# Patient Record
Sex: Female | Born: 1950 | Race: White | Hispanic: No | Marital: Single | Smoking: Current every day smoker
Health system: Southern US, Community
[De-identification: ages and names within clinical notes are randomized; demographics above are authoritative.]

## PROBLEM LIST (undated history)

## (undated) DIAGNOSIS — R42 Dizziness and giddiness: Secondary | ICD-10-CM

## (undated) DIAGNOSIS — I10 Essential (primary) hypertension: Secondary | ICD-10-CM

## (undated) DIAGNOSIS — I499 Cardiac arrhythmia, unspecified: Secondary | ICD-10-CM

## (undated) DIAGNOSIS — J45909 Unspecified asthma, uncomplicated: Secondary | ICD-10-CM

## (undated) DIAGNOSIS — K219 Gastro-esophageal reflux disease without esophagitis: Secondary | ICD-10-CM

## (undated) DIAGNOSIS — J449 Chronic obstructive pulmonary disease, unspecified: Secondary | ICD-10-CM

## (undated) DIAGNOSIS — Z87442 Personal history of urinary calculi: Secondary | ICD-10-CM

## (undated) DIAGNOSIS — M1990 Unspecified osteoarthritis, unspecified site: Secondary | ICD-10-CM

## (undated) HISTORY — PX: ABDOMINAL HYSTERECTOMY: SHX81

## (undated) HISTORY — PX: MANDIBLE SURGERY: SHX707

## (undated) HISTORY — PX: BREAST BIOPSY: SHX20

## (undated) HISTORY — PX: THROAT SURGERY: SHX803

## (undated) HISTORY — PX: LYMPH NODE DISSECTION: SHX5087

## (undated) HISTORY — PX: TUBAL LIGATION: SHX77

## (undated) HISTORY — PX: WISDOM TOOTH EXTRACTION: SHX21

## (undated) MED FILL — Fosaprepitant Dimeglumine For IV Infusion 150 MG (Base Eq): INTRAVENOUS | Qty: 5 | Status: AC

---

## 2007-02-13 ENCOUNTER — Emergency Department: Payer: Self-pay | Admitting: Internal Medicine

## 2009-04-06 DIAGNOSIS — G459 Transient cerebral ischemic attack, unspecified: Secondary | ICD-10-CM

## 2009-04-06 HISTORY — DX: Transient cerebral ischemic attack, unspecified: G45.9

## 2009-08-30 DIAGNOSIS — J019 Acute sinusitis, unspecified: Secondary | ICD-10-CM | POA: Insufficient documentation

## 2010-05-22 DIAGNOSIS — B86 Scabies: Secondary | ICD-10-CM | POA: Insufficient documentation

## 2011-03-26 DIAGNOSIS — Z8249 Family history of ischemic heart disease and other diseases of the circulatory system: Secondary | ICD-10-CM | POA: Insufficient documentation

## 2012-04-29 DIAGNOSIS — I10 Essential (primary) hypertension: Secondary | ICD-10-CM | POA: Insufficient documentation

## 2014-07-05 DIAGNOSIS — F17200 Nicotine dependence, unspecified, uncomplicated: Secondary | ICD-10-CM | POA: Insufficient documentation

## 2016-01-06 ENCOUNTER — Encounter (INDEPENDENT_AMBULATORY_CARE_PROVIDER_SITE_OTHER): Payer: Self-pay

## 2016-01-06 ENCOUNTER — Ambulatory Visit
Admission: RE | Admit: 2016-01-06 | Discharge: 2016-01-06 | Disposition: A | Discharge status: Home / Self Care | Payer: Self-pay | Source: Ambulatory Visit | Attending: Oncology | Admitting: Oncology

## 2016-01-06 ENCOUNTER — Ambulatory Visit: Payer: Self-pay | Attending: Oncology

## 2016-01-06 VITALS — BP 146/88 | HR 72 | Temp 97.6°F | Ht 60.24 in | Wt 109.3 lb

## 2016-01-06 DIAGNOSIS — Z0000 Encounter for general adult medical examination without abnormal findings: Secondary | ICD-10-CM

## 2016-01-06 NOTE — Progress Notes (Signed)
Subjective:     Patient ID: Samuel Jester, female   DOB: Jun 08, 1950, 65 y.o.   MRN: [id number]  HPI   Review of Systems     Objective:   Physical Exam  Pulmonary/Chest: Right breast exhibits no inverted nipple, no mass, no nipple discharge, no skin change and no tenderness. Left breast exhibits no inverted nipple, no mass, no nipple discharge, no skin change and no tenderness. Breasts are symmetrical.    Shoulder surgery -right       Assessment:     65 year old patient presents for [facility] clinic visit.  Patient screened, and meets BCCCP eligibility.  Patient does not have insurance, Medicare or Medicaid.  Handout given on Affordable Care Act.  Instructed patient on breast self-exam using teach back method.  CBE unremarkable.  States she had previous mammogram in [place] 6 years ago, with benign results.    Plan:     Sent for bilateral screening mammogram.

## 2016-01-21 ENCOUNTER — Other Ambulatory Visit: Payer: Self-pay | Admitting: *Deleted

## 2016-01-21 ENCOUNTER — Inpatient Hospital Stay
Admission: RE | Admit: 2016-01-21 | Discharge: 2016-01-21 | Disposition: A | Discharge status: Home / Self Care | Payer: Self-pay | Source: Ambulatory Visit | Attending: *Deleted | Admitting: *Deleted

## 2016-01-21 DIAGNOSIS — Z9289 Personal history of other medical treatment: Secondary | ICD-10-CM

## 2016-01-21 NOTE — Progress Notes (Signed)
Letter mailed from [facility] to notify of normal mammogram results.  Patient to return in one year for annual screening.  Copy to [facility]. 

## 2016-09-14 ENCOUNTER — Ambulatory Visit: Payer: Self-pay | Admitting: Pharmacy Technician

## 2016-09-14 NOTE — Progress Notes (Signed)
Patient scheduled for eligibility appointment at [facility].  Patient did not show for the appointment on September 14, 2016 at 9:00a.m.  Patient called to cancel and did not reschedule eligibility appointment.  [facility] unable to provide additional medication assistance until eligibility is determined.  [facility]

## 2016-12-02 ENCOUNTER — Other Ambulatory Visit: Payer: Self-pay | Admitting: Family Medicine

## 2016-12-02 DIAGNOSIS — E2839 Other primary ovarian failure: Secondary | ICD-10-CM

## 2016-12-24 ENCOUNTER — Other Ambulatory Visit: Payer: Self-pay | Admitting: Family Medicine

## 2016-12-24 DIAGNOSIS — Z1231 Encounter for screening mammogram for malignant neoplasm of breast: Secondary | ICD-10-CM

## 2017-02-02 ENCOUNTER — Ambulatory Visit
Admission: RE | Admit: 2017-02-02 | Discharge: 2017-02-02 | Disposition: A | Discharge status: Home / Self Care | Payer: Medicare HMO | Source: Ambulatory Visit | Attending: Family Medicine | Admitting: Family Medicine

## 2017-02-02 DIAGNOSIS — Z780 Asymptomatic menopausal state: Secondary | ICD-10-CM | POA: Diagnosis not present

## 2017-02-02 DIAGNOSIS — Z1231 Encounter for screening mammogram for malignant neoplasm of breast: Secondary | ICD-10-CM | POA: Insufficient documentation

## 2017-02-02 DIAGNOSIS — E2839 Other primary ovarian failure: Secondary | ICD-10-CM | POA: Diagnosis not present

## 2017-02-02 DIAGNOSIS — Z13820 Encounter for screening for osteoporosis: Secondary | ICD-10-CM | POA: Insufficient documentation

## 2017-02-02 DIAGNOSIS — M4186 Other forms of scoliosis, lumbar region: Secondary | ICD-10-CM | POA: Insufficient documentation

## 2017-02-02 DIAGNOSIS — M47816 Spondylosis without myelopathy or radiculopathy, lumbar region: Secondary | ICD-10-CM | POA: Insufficient documentation

## 2017-08-24 ENCOUNTER — Telehealth: Payer: Self-pay | Admitting: *Deleted

## 2017-08-24 NOTE — Telephone Encounter (Signed)
Received referral for low dose lung cancer screening CT scan. Message left at phone number listed in EMR for patient to call me back to facilitate scheduling scan.  

## 2017-09-01 ENCOUNTER — Telehealth: Payer: Self-pay | Admitting: *Deleted

## 2017-09-01 DIAGNOSIS — Z122 Encounter for screening for malignant neoplasm of respiratory organs: Secondary | ICD-10-CM

## 2017-09-01 DIAGNOSIS — Z87891 Personal history of nicotine dependence: Secondary | ICD-10-CM

## 2017-09-01 NOTE — Telephone Encounter (Signed)
Received referral for initial lung cancer screening scan. Contacted patient and obtained smoking history,(current, 102 pack year) as well as answering questions related to screening process. Patient denies signs of lung cancer such as weight loss or hemoptysis. Patient denies comorbidity that would prevent curative treatment if lung cancer were found. Patient is scheduled for shared decision making visit and CT scan on 09/23/17 .

## 2017-09-22 ENCOUNTER — Encounter: Payer: Self-pay | Admitting: Oncology

## 2017-09-23 ENCOUNTER — Inpatient Hospital Stay: Payer: Medicare HMO | Attending: Oncology | Admitting: Oncology

## 2017-09-23 ENCOUNTER — Encounter (INDEPENDENT_AMBULATORY_CARE_PROVIDER_SITE_OTHER): Payer: Self-pay

## 2017-09-23 ENCOUNTER — Ambulatory Visit
Admission: RE | Admit: 2017-09-23 | Discharge: 2017-09-23 | Disposition: A | Discharge status: Home / Self Care | Payer: Medicare HMO | Source: Ambulatory Visit | Attending: Nurse Practitioner | Admitting: Nurse Practitioner

## 2017-09-23 DIAGNOSIS — I700 Atherosclerosis of aorta: Secondary | ICD-10-CM | POA: Diagnosis not present

## 2017-09-23 DIAGNOSIS — Z87891 Personal history of nicotine dependence: Secondary | ICD-10-CM | POA: Insufficient documentation

## 2017-09-23 DIAGNOSIS — Z122 Encounter for screening for malignant neoplasm of respiratory organs: Secondary | ICD-10-CM | POA: Diagnosis not present

## 2017-09-23 DIAGNOSIS — J439 Emphysema, unspecified: Secondary | ICD-10-CM | POA: Diagnosis not present

## 2017-09-23 NOTE — Progress Notes (Signed)
In accordance with CMS guidelines, patient has met eligibility criteria including age, absence of signs or symptoms of lung cancer.  Social History   Tobacco Use  . Smoking status: Current Every Day Smoker    Packs/day: 2.00    Years: 51.00    Pack years: 102.00    Types: Cigarettes  Substance Use Topics  . Alcohol use: Not on file  . Drug use: Not on file     A shared decision-making session was conducted prior to the performance of CT scan. This includes one or more decision aids, includes benefits and harms of screening, follow-up diagnostic testing, over-diagnosis, false positive rate, and total radiation exposure.  Counseling on the importance of adherence to annual lung cancer LDCT screening, impact of co-morbidities, and ability or willingness to undergo diagnosis and treatment is imperative for compliance of the program.  Counseling on the importance of continued smoking cessation for former smokers; the importance of smoking cessation for current smokers, and information about tobacco cessation interventions have been given to patient including [place] and [address] [facility] [place] programs.  Written order for lung cancer screening with LDCT has been given to the patient and any and all questions have been answered to the best of my abilities.   Yearly follow up will be coordinated by Burgess Estelle, Thoracic Navigator.  Faythe Casa, NP 09/23/2017 2:45 PM

## 2017-09-27 ENCOUNTER — Encounter: Payer: Self-pay | Admitting: *Deleted

## 2018-05-24 ENCOUNTER — Other Ambulatory Visit: Payer: Self-pay | Admitting: Family Medicine

## 2018-05-24 DIAGNOSIS — Z1231 Encounter for screening mammogram for malignant neoplasm of breast: Secondary | ICD-10-CM

## 2018-06-14 ENCOUNTER — Ambulatory Visit
Admission: RE | Admit: 2018-06-14 | Discharge: 2018-06-14 | Disposition: A | Discharge status: Home / Self Care | Payer: Medicare HMO | Source: Ambulatory Visit | Attending: Family Medicine | Admitting: Family Medicine

## 2018-06-14 DIAGNOSIS — Z1231 Encounter for screening mammogram for malignant neoplasm of breast: Secondary | ICD-10-CM

## 2018-10-05 ENCOUNTER — Telehealth: Payer: Self-pay | Admitting: *Deleted

## 2018-10-05 NOTE — Telephone Encounter (Signed)
Attempted to contact regarding lung screening scan, however there is no answer or voicemail option.

## 2018-10-11 ENCOUNTER — Telehealth: Payer: Self-pay | Admitting: *Deleted

## 2018-10-11 NOTE — Telephone Encounter (Signed)
Attempted to contact to schedule annual lung screening scan. However there is no answer or voicemail option.

## 2018-10-17 ENCOUNTER — Encounter: Payer: Self-pay | Admitting: *Deleted

## 2019-02-10 ENCOUNTER — Telehealth: Payer: Self-pay | Admitting: *Deleted

## 2019-02-10 DIAGNOSIS — Z122 Encounter for screening for malignant neoplasm of respiratory organs: Secondary | ICD-10-CM

## 2019-02-10 DIAGNOSIS — Z87891 Personal history of nicotine dependence: Secondary | ICD-10-CM

## 2019-02-10 NOTE — Telephone Encounter (Signed)
Patient has been notified that annual lung cancer screening low dose CT scan is due currently or will be in near future. Confirmed that patient is within the age range of 55-77, and asymptomatic, (no signs or symptoms of lung cancer). Patient denies illness that would prevent curative treatment for lung cancer if found. Verified smoking history, (current, 103 pack year). The shared decision making visit was done 09/23/17. Patient is agreeable for CT scan being scheduled.

## 2019-02-17 ENCOUNTER — Ambulatory Visit
Admission: RE | Admit: 2019-02-17 | Discharge: 2019-02-17 | Disposition: A | Discharge status: Home / Self Care | Payer: Medicare HMO | Source: Ambulatory Visit | Attending: Oncology | Admitting: Oncology

## 2019-02-17 ENCOUNTER — Other Ambulatory Visit: Payer: Self-pay

## 2019-02-17 DIAGNOSIS — Z87891 Personal history of nicotine dependence: Secondary | ICD-10-CM | POA: Insufficient documentation

## 2019-02-17 DIAGNOSIS — Z122 Encounter for screening for malignant neoplasm of respiratory organs: Secondary | ICD-10-CM | POA: Diagnosis not present

## 2019-02-21 ENCOUNTER — Encounter: Payer: Self-pay | Admitting: *Deleted

## 2020-02-14 ENCOUNTER — Telehealth: Payer: Self-pay | Admitting: *Deleted

## 2020-02-14 NOTE — Telephone Encounter (Signed)
Contacted in attempt to schedule annual lung screening scan. Patient reports she wants to have physical next week and then consider scheduling lung screening scan.

## 2020-02-22 ENCOUNTER — Telehealth: Payer: Self-pay | Admitting: *Deleted

## 2020-02-22 NOTE — Telephone Encounter (Signed)
Attempted to contact and schedule lung screening scan. Message left for patient to call back to schedule. 

## 2020-02-26 ENCOUNTER — Other Ambulatory Visit: Payer: Self-pay | Admitting: Physician Assistant

## 2020-02-26 DIAGNOSIS — Z1231 Encounter for screening mammogram for malignant neoplasm of breast: Secondary | ICD-10-CM

## 2020-03-05 ENCOUNTER — Other Ambulatory Visit: Payer: Self-pay | Admitting: *Deleted

## 2020-03-05 DIAGNOSIS — Z122 Encounter for screening for malignant neoplasm of respiratory organs: Secondary | ICD-10-CM

## 2020-03-05 DIAGNOSIS — Z87891 Personal history of nicotine dependence: Secondary | ICD-10-CM

## 2020-03-05 NOTE — Progress Notes (Signed)
Contacted and scheduled for lung screening scan. Patient is a current smoker with a 103 pack year history.

## 2020-03-12 ENCOUNTER — Other Ambulatory Visit: Payer: Self-pay

## 2020-03-12 ENCOUNTER — Ambulatory Visit
Admission: RE | Admit: 2020-03-12 | Discharge: 2020-03-12 | Disposition: A | Discharge status: Home / Self Care | Payer: Medicare HMO | Source: Ambulatory Visit | Attending: Oncology | Admitting: Oncology

## 2020-03-12 DIAGNOSIS — Z122 Encounter for screening for malignant neoplasm of respiratory organs: Secondary | ICD-10-CM | POA: Diagnosis present

## 2020-03-12 DIAGNOSIS — Z87891 Personal history of nicotine dependence: Secondary | ICD-10-CM | POA: Insufficient documentation

## 2020-03-18 ENCOUNTER — Encounter: Payer: Self-pay | Admitting: *Deleted

## 2020-07-24 ENCOUNTER — Encounter: Payer: Self-pay | Admitting: *Deleted

## 2020-09-24 ENCOUNTER — Other Ambulatory Visit: Payer: Self-pay | Admitting: Family Medicine

## 2020-09-24 DIAGNOSIS — Z1231 Encounter for screening mammogram for malignant neoplasm of breast: Secondary | ICD-10-CM

## 2021-06-10 ENCOUNTER — Ambulatory Visit
Admission: RE | Admit: 2021-06-10 | Discharge: 2021-06-10 | Disposition: A | Discharge status: Home / Self Care | Payer: Medicare HMO | Source: Ambulatory Visit | Attending: Nurse Practitioner | Admitting: Nurse Practitioner

## 2021-06-10 ENCOUNTER — Other Ambulatory Visit: Payer: Self-pay | Admitting: Nurse Practitioner

## 2021-06-10 ENCOUNTER — Other Ambulatory Visit: Payer: Self-pay

## 2021-06-10 DIAGNOSIS — G459 Transient cerebral ischemic attack, unspecified: Secondary | ICD-10-CM

## 2021-06-10 DIAGNOSIS — Z9181 History of falling: Secondary | ICD-10-CM | POA: Diagnosis present

## 2021-06-16 ENCOUNTER — Telehealth: Payer: Self-pay | Admitting: Acute Care

## 2021-06-16 NOTE — Telephone Encounter (Signed)
Spoke with patient by phone to schedule LDCT.  She states she has 'a lot of other tests and things' right now and would like to call us back.  She was give the [facility] phone number to contact us when she is able to schedule. ?

## 2021-07-09 ENCOUNTER — Encounter: Payer: Self-pay | Admitting: Otolaryngology

## 2021-07-16 ENCOUNTER — Other Ambulatory Visit
Admission: RE | Admit: 2021-07-16 | Discharge: 2021-07-16 | Disposition: A | Discharge status: Home / Self Care | Payer: Medicare HMO | Source: Ambulatory Visit | Attending: Otolaryngology | Admitting: Otolaryngology

## 2021-07-16 HISTORY — DX: Personal history of urinary calculi: Z87.442

## 2021-07-16 NOTE — Patient Instructions (Addendum)
?Your procedure is scheduled on: Wednesday July 23, 2021 ?Report to Day Surgery inside [facility] 2nd floor, stop by admissions desk before getting on elevator.  ?To find out your arrival time please call [phone] between 1PM - 3PM on Tuesday April, 18, 2023. ? ?Remember: Instructions that are not followed completely may result in serious medical risk,  ?up to and including death, or upon the discretion of your surgeon and anesthesiologist your  ?surgery may need to be rescheduled.  ? ?  _X__ 1. Do not eat food after midnight the night before your procedure. ?                No chewing gum or hard candies. You may drink clear liquids up to 2 hours ?                before you are scheduled to arrive for your surgery- DO not drink clear ?                liquids within 2 hours of the start of your surgery. ?                Clear Liquids include:  water, apple juice without pulp, clear Gatorade, G2 or  ?                Gatorade Zero (avoid Red/Purple/Blue), Black Coffee or Tea (Do not add ?                anything to coffee or tea). ? ?__X__2.  On the morning of surgery brush your teeth with toothpaste and water, you ?               may rinse your mouth with mouthwash if you wish.  Do not swallow any toothpaste or mouthwash. ?   ? _X__ 3.  No Alcohol for 24 hours before or after surgery. ? ? _X__ 4.  Do Not Smoke or use e-cigarettes For 24 Hours Prior to Your Surgery. ?                Do not use any chewable tobacco products for at least 6 hours prior to ?                Surgery. ? ?_X__  5.  Do not use any recreational drugs (marijuana, cocaine, heroin, ecstasy, MDMA or other) ?               For at least one week prior to your surgery.  Combination of these drugs with anesthesia ?               May have life threatening results. ? ?____  6.  Bring all medications with you on the day of surgery if instructed.  ? ?__X__  7.  Notify your doctor if there is any change in your medical  condition  ?    (cold, fever, infections). ?    ?Do not wear jewelry, make-up, hairpins, clips or nail polish. ?Do not wear lotions, powders, or perfumes. You may wear deodorant. ?Do not shave 48 hours prior to surgery.  ?Do not bring valuables to the hospital.   ? ?[facility] is not responsible for any belongings or valuables. ? ?Contacts, dentures or bridgework may not be worn into surgery. ?Leave your suitcase in the car. After surgery it may be brought to your room. ?For patients admitted to the hospital, discharge time is determined by your ?treatment team. ?  ?  Patients discharged the day of surgery will not be allowed to drive home.   ?Make arrangements for someone to be with you for the first 24 hours of your ?Same Day Discharge. ? ? ?__X__ Take these medicines the morning of surgery with A SIP OF WATER:  ? ? 1. amLODipine (NORVASC) 10 MG ? 2. famotidine (PEPCID) 10 MG  ? 3. metoprolol succinate (TOPROL-XL) 100 MG ? 4. ? 5. ? 6. ? ?____ Fleet Enema (as directed)  ? ?____ Use CHG Soap (or wipes) as directed ? ?____ Use Benzoyl Peroxide Gel as instructed ? ?__X__ Use inhalers on the day of surgery ? albuterol (VENTOLIN HFA) 108 (90 Base) MCG/ACT inhaler ? umeclidinium bromide (INCRUSE ELLIPTA) 62.5 MCG/ACT AEPB ? ?____ Stop metformin 2 days prior to surgery   ? ?____ Take 1/2 of usual insulin dose the night before surgery. No insulin the morning ?         of surgery.  ? ?__X__ Stop aspirin 81 mg 3 days  before your surgery. (Take last dose 07/19/21) ? ?__X__ One Week prior to surgery- Stop Anti-inflammatories such as Ibuprofen, Aleve, Advil, Motrin, meloxicam (MOBIC), diclofenac, etodolac, ketorolac, Toradol, Daypro, piroxicam, Goody's or BC powders. OK TO USE TYLENOL IF NEEDED ?  ?__X__ Stop supplements until after surgery.   ? ?____ Bring C-Pap to the hospital.  ? ? ?If you have any questions regarding your pre-procedure instructions,  ?Please call Pre-admit Testing at [phone] ?

## 2021-07-18 ENCOUNTER — Encounter
Admission: RE | Admit: 2021-07-18 | Discharge: 2021-07-18 | Disposition: A | Discharge status: Home / Self Care | Payer: Medicare HMO | Source: Ambulatory Visit | Attending: Otolaryngology | Admitting: Otolaryngology

## 2021-07-18 DIAGNOSIS — Z01810 Encounter for preprocedural cardiovascular examination: Secondary | ICD-10-CM | POA: Diagnosis not present

## 2021-07-18 DIAGNOSIS — Z01812 Encounter for preprocedural laboratory examination: Secondary | ICD-10-CM

## 2021-07-18 DIAGNOSIS — Z01818 Encounter for other preprocedural examination: Secondary | ICD-10-CM | POA: Diagnosis not present

## 2021-07-18 LAB — BASIC METABOLIC PANEL
Anion gap: 10 (ref 5–15)
BUN: 7 mg/dL — ABNORMAL LOW (ref 8–23)
CO2: 25 mmol/L (ref 22–32)
Calcium: 9.4 mg/dL (ref 8.9–10.3)
Chloride: 91 mmol/L — ABNORMAL LOW (ref 98–111)
Creatinine, Ser: 0.41 mg/dL — ABNORMAL LOW (ref 0.44–1.00)
GFR, Estimated: >60 mL/min (ref >60)
Glucose, Bld: 135 mg/dL — ABNORMAL HIGH (ref 70–99)
Potassium: 3.6 mmol/L (ref 3.5–5.1)
Sodium: 126 mmol/L — ABNORMAL LOW (ref 135–145)

## 2021-07-18 LAB — CBC
HCT: 38.1 % (ref 36.0–46.0)
Hemoglobin: 12.3 g/dL (ref 12.0–15.0)
MCH: 29.2 pg (ref 26.0–34.0)
MCHC: 32.3 g/dL (ref 30.0–36.0)
MCV: 90.5 fL (ref 80.0–100.0)
Platelets: 435 10*3/uL — ABNORMAL HIGH (ref 150–400)
RBC: 4.21 MIL/uL (ref 3.87–5.11)
RDW: 12.2 % (ref 11.5–15.5)
WBC: 9.7 10*3/uL (ref 4.0–10.5)
nRBC: 0 % (ref 0.0–0.2)

## 2021-07-18 NOTE — Pre-Procedure Instructions (Signed)
Faxed over Na result of 126 to pcp (Dr Alene Mires) per anesthesia. We well recheck Na dos but anesthesia wanted this sent to pcp for further w/u after surgery ?

## 2021-07-23 ENCOUNTER — Other Ambulatory Visit: Payer: Self-pay

## 2021-07-23 ENCOUNTER — Encounter: Payer: Self-pay | Admitting: Anesthesiology

## 2021-07-23 ENCOUNTER — Ambulatory Visit: Payer: Self-pay | Admitting: Anesthesiology

## 2021-07-23 ENCOUNTER — Ambulatory Visit
Admission: RE | Admit: 2021-07-23 | Discharge: 2021-07-23 | Disposition: A | Discharge status: Home / Self Care | Payer: Medicare HMO | Attending: Otolaryngology | Admitting: Otolaryngology

## 2021-07-23 ENCOUNTER — Encounter
Admission: RE | Disposition: A | Discharge status: Home / Self Care | Payer: Self-pay | Source: Home / Self Care | Attending: Otolaryngology

## 2021-07-23 DIAGNOSIS — C119 Malignant neoplasm of nasopharynx, unspecified: Secondary | ICD-10-CM | POA: Insufficient documentation

## 2021-07-23 DIAGNOSIS — Z01812 Encounter for preprocedural laboratory examination: Secondary | ICD-10-CM

## 2021-07-23 DIAGNOSIS — F17210 Nicotine dependence, cigarettes, uncomplicated: Secondary | ICD-10-CM | POA: Diagnosis not present

## 2021-07-23 HISTORY — DX: Unspecified asthma, uncomplicated: J45.909

## 2021-07-23 HISTORY — DX: Essential (primary) hypertension: I10

## 2021-07-23 HISTORY — DX: Chronic obstructive pulmonary disease, unspecified: J44.9

## 2021-07-23 HISTORY — DX: Dizziness and giddiness: R42

## 2021-07-23 HISTORY — DX: Gastro-esophageal reflux disease without esophagitis: K21.9

## 2021-07-23 HISTORY — PX: NASAL ENDOSCOPY: SHX6577

## 2021-07-23 HISTORY — DX: Unspecified osteoarthritis, unspecified site: M19.90

## 2021-07-23 HISTORY — DX: Cardiac arrhythmia, unspecified: I49.9

## 2021-07-23 LAB — SODIUM: Sodium: 126 mmol/L — ABNORMAL LOW (ref 135–145)

## 2021-07-23 SURGERY — ENDOSCOPY, NOSE
Anesthesia: General | Laterality: Bilateral

## 2021-07-23 MED ORDER — LACTATED RINGERS IV SOLN: INTRAVENOUS | Status: DC | PRN

## 2021-07-23 MED ORDER — LIDOCAINE HCL (CARDIAC) PF 100 MG/5ML IV SOSY
PREFILLED_SYRINGE | INTRAVENOUS | Status: DC | PRN
Start: 2021-07-23 — End: 2021-07-23
  Administered 2021-07-23 (×2): 50 mg via INTRAVENOUS

## 2021-07-23 MED ORDER — PHENYLEPHRINE 80 MCG/ML (10ML) SYRINGE FOR IV PUSH (FOR BLOOD PRESSURE SUPPORT)
PREFILLED_SYRINGE | INTRAVENOUS | Status: AC
  Filled 2021-07-23: qty 10

## 2021-07-23 MED ORDER — OXYCODONE HCL 5 MG/5ML PO SOLN: 5.0000 mg | Freq: Once | ORAL | Status: DC | PRN

## 2021-07-23 MED ORDER — PROPOFOL 10 MG/ML IV BOLUS
INTRAVENOUS | Status: DC | PRN
  Administered 2021-07-23: 100 mg via INTRAVENOUS
  Administered 2021-07-23: 60 mg via INTRAVENOUS

## 2021-07-23 MED ORDER — DEXAMETHASONE SODIUM PHOSPHATE 10 MG/ML IJ SOLN
INTRAMUSCULAR | Status: AC
  Filled 2021-07-23: qty 1

## 2021-07-23 MED ORDER — OXYMETAZOLINE HCL 0.05 % NA SOLN
NASAL | Status: DC | PRN
  Administered 2021-07-23: 1

## 2021-07-23 MED ORDER — ONDANSETRON HCL 4 MG/2ML IJ SOLN: 4.0000 mg | Freq: Once | INTRAMUSCULAR | Status: DC | PRN

## 2021-07-23 MED ORDER — LIDOCAINE-EPINEPHRINE 1 %-1:200000 IJ SOLN
INTRAMUSCULAR | Status: AC
Start: 2021-07-23 — End: ?
  Filled 2021-07-23: qty 30

## 2021-07-23 MED ORDER — OXYCODONE HCL 5 MG PO TABS: 5.0000 mg | ORAL_TABLET | Freq: Once | ORAL | Status: DC | PRN

## 2021-07-23 MED ORDER — 0.9 % SODIUM CHLORIDE (POUR BTL) OPTIME
TOPICAL | Status: DC | PRN
Start: 2021-07-23 — End: 2021-07-23
  Administered 2021-07-23: 150 mL

## 2021-07-23 MED ORDER — PHENYLEPHRINE 80 MCG/ML (10ML) SYRINGE FOR IV PUSH (FOR BLOOD PRESSURE SUPPORT)
PREFILLED_SYRINGE | INTRAVENOUS | Status: DC | PRN
  Administered 2021-07-23 (×2): 80 ug via INTRAVENOUS
  Administered 2021-07-23: 160 ug via INTRAVENOUS

## 2021-07-23 MED ORDER — OXYMETAZOLINE HCL 0.05 % NA SOLN
NASAL | Status: AC
Start: 2021-07-23 — End: ?
  Filled 2021-07-23: qty 30

## 2021-07-23 MED ORDER — CHLORHEXIDINE GLUCONATE 0.12 % MT SOLN: 15.0000 mL | Freq: Once | OROMUCOSAL | Status: DC

## 2021-07-23 MED ORDER — LACTATED RINGERS IV SOLN: INTRAVENOUS | Status: DC

## 2021-07-23 MED ORDER — FENTANYL CITRATE (PF) 100 MCG/2ML IJ SOLN
INTRAMUSCULAR | Status: AC
  Administered 2021-07-23: 50 ug via INTRAVENOUS
  Filled 2021-07-23: qty 2

## 2021-07-23 MED ORDER — ONDANSETRON HCL 4 MG/2ML IJ SOLN
INTRAMUSCULAR | Status: AC
  Filled 2021-07-23: qty 2

## 2021-07-23 MED ORDER — DEXAMETHASONE SODIUM PHOSPHATE 10 MG/ML IJ SOLN
INTRAMUSCULAR | Status: DC | PRN
  Administered 2021-07-23: 10 mg via INTRAVENOUS

## 2021-07-23 MED ORDER — MIDAZOLAM HCL 2 MG/2ML IJ SOLN
INTRAMUSCULAR | Status: AC
  Filled 2021-07-23: qty 2

## 2021-07-23 MED ORDER — FENTANYL CITRATE (PF) 100 MCG/2ML IJ SOLN
INTRAMUSCULAR | Status: AC
Start: 2021-07-23 — End: ?
  Filled 2021-07-23: qty 2

## 2021-07-23 MED ORDER — ONDANSETRON HCL 4 MG/2ML IJ SOLN
INTRAMUSCULAR | Status: DC | PRN
  Administered 2021-07-23: 4 mg via INTRAVENOUS

## 2021-07-23 MED ORDER — PROPOFOL 10 MG/ML IV BOLUS
INTRAVENOUS | Status: AC
  Filled 2021-07-23: qty 20

## 2021-07-23 MED ORDER — FENTANYL CITRATE (PF) 100 MCG/2ML IJ SOLN
25.0000 ug | INTRAMUSCULAR | Status: DC | PRN
  Administered 2021-07-23: 50 ug via INTRAVENOUS

## 2021-07-23 MED ORDER — LIDOCAINE HCL (PF) 2 % IJ SOLN
INTRAMUSCULAR | Status: AC
  Filled 2021-07-23: qty 5

## 2021-07-23 MED ORDER — SUCCINYLCHOLINE CHLORIDE 200 MG/10ML IV SOSY
PREFILLED_SYRINGE | INTRAVENOUS | Status: DC | PRN
  Administered 2021-07-23: 60 mg via INTRAVENOUS

## 2021-07-23 MED ORDER — FENTANYL CITRATE (PF) 100 MCG/2ML IJ SOLN
INTRAMUSCULAR | Status: DC | PRN
  Administered 2021-07-23 (×4): 25 ug via INTRAVENOUS

## 2021-07-23 MED ORDER — PHENYLEPHRINE HCL-NACL 20-0.9 MG/250ML-% IV SOLN
INTRAVENOUS | Status: AC
  Filled 2021-07-23: qty 250

## 2021-07-23 MED ORDER — MIDAZOLAM HCL 2 MG/2ML IJ SOLN
INTRAMUSCULAR | Status: DC | PRN
  Administered 2021-07-23: 1 mg via INTRAVENOUS

## 2021-07-23 MED ORDER — LIDOCAINE-EPINEPHRINE (PF) 1 %-1:200000 IJ SOLN
INTRAMUSCULAR | Status: DC | PRN
  Administered 2021-07-23: 3 mL

## 2021-07-23 MED ORDER — ORAL CARE MOUTH RINSE: 15.0000 mL | Freq: Once | OROMUCOSAL | Status: DC

## 2021-07-23 SURGICAL SUPPLY — 14 items
BLADE SURG 15 STRL LF DISP TIS (BLADE) ×1 IMPLANT
BLADE SURG 15 STRL SS (BLADE) ×1
ELECT REM PT RETURN 9FT ADLT (ELECTROSURGICAL) ×2
ELECTRODE REM PT RTRN 9FT ADLT (ELECTROSURGICAL) ×1 IMPLANT
GAUZE 4X4 16PLY ~~LOC~~+RFID DBL (SPONGE) ×2 IMPLANT
GLOVE SURG ENC MOIS LTX SZ7.5 (GLOVE) ×2 IMPLANT
GOWN STRL REUS W/ TWL LRG LVL3 (GOWN DISPOSABLE) ×2 IMPLANT
GOWN STRL REUS W/TWL LRG LVL3 (GOWN DISPOSABLE) ×2
MANIFOLD NEPTUNE II (INSTRUMENTS) ×2 IMPLANT
NDL SPNL 22GX3.5 QUINCKE BK (NEEDLE) IMPLANT
NEEDLE SPNL 22GX3.5 QUINCKE BK (NEEDLE) ×2 IMPLANT
PACK HEAD/NECK (MISCELLANEOUS) ×2 IMPLANT
SPONGE NEURO XRAY DETECT 1X3 (DISPOSABLE) ×2 IMPLANT
WATER STERILE IRR 500ML POUR (IV SOLUTION) ×2 IMPLANT

## 2021-07-23 NOTE — Anesthesia Postprocedure Evaluation (Signed)
Anesthesia Post Note ? ?Patient: Vickie Caldwell ? ?Procedure(s) Performed: NASAL ENDOSCOPY WITH BIOPSY OF NASOPHARYNGEAL MASS (Bilateral) ? ?Patient location during evaluation: PACU ?Anesthesia Type: General ?Level of consciousness: awake and alert, oriented and patient cooperative ?Pain management: pain level controlled ?Vital Signs Assessment: post-procedure vital signs reviewed and stable ?Respiratory status: spontaneous breathing, nonlabored ventilation and respiratory function stable ?Cardiovascular status: blood pressure returned to baseline and stable ?Postop Assessment: adequate PO intake ?Anesthetic complications: no ? ? ?No notable events documented. ? ? ?Last Vitals:  ?Vitals:  ? 07/23/21 0945 07/23/21 1000  ?BP: (!) 144/76 (!) 141/99  ?Pulse: 69 74  ?Resp: 16 14  ?Temp: 36.7 ?C (!) 36.3 ?C  ?SpO2: 96% 94%  ?  ?Last Pain:  ?Vitals:  ? 07/23/21 1000  ?TempSrc: Temporal  ?PainSc: 0-No pain  ? ? ?  ?  ?  ?  ?  ?  ? ?Darrin Nipper ? ? ? ? ?

## 2021-07-23 NOTE — Anesthesia Preprocedure Evaluation (Addendum)
Anesthesia Evaluation  ?Patient identified by MRN, date of birth, ID band ?Patient awake ? ? ? ?Reviewed: ?Allergy & Precautions, NPO status , Patient's Chart, lab work & pertinent test results ? ?History of Anesthesia Complications ?Negative for: history of anesthetic complications ? ?Airway ?Mallampati: I ? ? ?Neck ROM: Full ? ? ? Dental ? ?(+) Missing, Chipped ?  ?Pulmonary ?asthma , COPD, Current Smoker (1 ppd)Patient did not abstain from smoking.,  ?  ?Pulmonary exam normal ?breath sounds clear to auscultation ? ? ? ? ? ? Cardiovascular ?hypertension, Normal cardiovascular exam ?Rhythm:Regular Rate:Normal ? ?ECG 07/18/21:  ?Normal sinus rhythm ?Incomplete right bundle branch block ?Borderline ECG ?When compared with ECG of 21-Aug-1999 11:34, ?No significant change was found ?  ?Neuro/Psych ?Vertigo  ?TIA (2011)  ? GI/Hepatic ?GERD  ,  ?Endo/Other  ?negative endocrine ROS ? Renal/GU ?Renal disease (nephrolithiasis)  ? ?  ?Musculoskeletal ? ?(+) Arthritis ,  ? Abdominal ?  ?Peds ? Hematology ?negative hematology ROS ?(+)   ?Anesthesia Other Findings ? ? Reproductive/Obstetrics ? ?  ? ? ? ? ? ? ? ? ? ? ? ? ? ?  ?  ? ? ? ? ? ? ? ?Anesthesia Physical ?Anesthesia Plan ? ?ASA: 3 ? ?Anesthesia Plan: General  ? ?Post-op Pain Management:   ? ?Induction: Intravenous ? ?PONV Risk Score and Plan: 2 and Treatment may vary due to age or medical condition, Ondansetron and Dexamethasone ? ?Airway Management Planned: Oral ETT ? ?Additional Equipment:  ? ?Intra-op Plan:  ? ?Post-operative Plan: Extubation in OR ? ?Informed Consent: I have reviewed the patients History and Physical, chart, labs and discussed the procedure including the risks, benefits and alternatives for the proposed anesthesia with the patient or authorized representative who has indicated his/her understanding and acceptance.  ? ? ? ?Dental advisory given ? ?Plan Discussed with: CRNA ? ?Anesthesia Plan Comments: (Patient  consented for risks of anesthesia including but not limited to:  ?- adverse reactions to medications ?- damage to eyes, teeth, lips or other oral mucosa ?- nerve damage due to positioning  ?- sore throat or hoarseness ?- damage to heart, brain, nerves, lungs, other parts of body or loss of life ? ?Informed patient about role of CRNA in peri- and intra-operative care.  Patient voiced understanding.)  ? ? ? ? ? ?Anesthesia Quick Evaluation ? ?

## 2021-07-23 NOTE — Transfer of Care (Addendum)
Immediate Anesthesia Transfer of Care Note ? ?Patient: Vickie Caldwell ? ?Procedure(s) Performed: NASAL ENDOSCOPY WITH BIOPSY OF NASOPHARYNGEAL MASS (Bilateral) ? ?Patient Location: PACU ? ?Anesthesia Type:General ? ?Level of Consciousness: awake ? ?Airway & Oxygen Therapy: Patient Spontanous Breathing and Patient connected to face mask ? ?Post-op Assessment: Report given to RN and Post -op Vital signs reviewed and stable ? ?Post vital signs: Reviewed and stable ? ?Last Vitals:  ?Vitals Value Taken Time  ?BP 129/86 07/23/21 0915  ?Temp 36.4 ?C 07/23/21 0910  ?Pulse 63 07/23/21 0915  ?Resp 12 07/23/21 0915  ?SpO2 100 % 07/23/21 0915  ?Vitals shown include unvalidated device data. ? ?Last Pain:  ?Vitals:  ? 07/23/21 0910  ?TempSrc:   ?PainSc: 0-No pain  ?   ? ?  ? ?Complications: No notable events documented. ?

## 2021-07-23 NOTE — H&P (Signed)
Vickie Caldwell, Vickie Caldwell ?[id number] ?06-07-1950 ? ?Date of Admission: '@TODAY'$ @ ?Admitting Physician: Riley Nearing ? ?Chief Complaint: Nasopharyngeal mass ? ?HPI: This 71 y.o. year old female presented with persistent headaches and some throat pain with findings of a nasopharyngeal mass on scope exam.  CT scan did not revealed any significant sinus disease. ? ?Medications:  ?Medications Prior to Admission  ?Medication Sig Dispense Refill  ? acetaminophen (TYLENOL) 325 MG tablet Take 650 mg by mouth every 6 (six) hours as needed.    ? albuterol (VENTOLIN HFA) 108 (90 Base) MCG/ACT inhaler Inhale into the lungs every 6 (six) hours as needed for wheezing or shortness of breath.    ? amLODipine (NORVASC) 10 MG tablet Take 10 mg by mouth daily.    ? ASPIRIN 81 PO Take by mouth daily.    ? Cholecalciferol (VITAMIN D3 PO) Take by mouth daily.    ? famotidine (PEPCID) 10 MG tablet Take 20 mg by mouth daily.    ? fluticasone (FLONASE) 50 MCG/ACT nasal spray Place into both nostrils daily.    ? metoprolol succinate (TOPROL-XL) 100 MG 24 hr tablet Take 100 mg by mouth daily. Take with or immediately following a meal.    ? montelukast (SINGULAIR) 10 MG tablet Take 10 mg by mouth daily.    ? umeclidinium bromide (INCRUSE ELLIPTA) 62.5 MCG/ACT AEPB Inhale 1 puff into the lungs daily.    ? ? ?Allergies:  ?Allergies  ?Allergen Reactions  ? Codeine Nausea And Vomiting  ? Penicillins Rash  ? ? ?PMH:  ?Past Medical History:  ?Diagnosis Date  ? Asthma   ? COPD (chronic obstructive pulmonary disease) ([place])   ? Dysrhythmia   ? GERD (gastroesophageal reflux disease)   ? History of kidney stones   ? Hypertension   ? Osteoarthritis   ? TIA (transient ischemic attack) 2011  ? No Deficits  ? Vertigo   ? ? ?Fam Hx:  ?Family History  ?Problem Relation Age of Onset  ? Breast cancer Neg Hx   ? ? ?Soc Hx:  ?Social History  ? ?Socioeconomic History  ? Marital status: Single  ?  Spouse name: Not on file  ? Number of children: Not on file  ? Years of  education: Not on file  ? Highest education level: Not on file  ?Occupational History  ? Not on file  ?Tobacco Use  ? Smoking status: Every Day  ?  Packs/day: 1.00  ?  Years: 51.00  ?  Pack years: 51.00  ?  Types: Cigarettes  ? Smokeless tobacco: Never  ? Tobacco comments:  ?  Started smoking around age 35  ?Vaping Use  ? Vaping Use: Never used  ?Substance and Sexual Activity  ? Alcohol use: Yes  ?  Alcohol/week: 1.0 standard drink  ?  Types: 1 Cans of beer per week  ?  Comment: occasional  ? Drug use: Never  ? Sexual activity: Not on file  ?Other Topics Concern  ? Not on file  ?Social History Narrative  ? Not on file  ? ?Social Determinants of Health  ? ?Financial Resource Strain: Not on file  ?Food Insecurity: Not on file  ?Transportation Needs: Not on file  ?Physical Activity: Not on file  ?Stress: Not on file  ?Social Connections: Not on file  ?Intimate Partner Violence: Not on file  ? ? ?PSH:  ?Past Surgical History:  ?Procedure Laterality Date  ? ABDOMINAL HYSTERECTOMY    ? BREAST BIOPSY Left 1980's  ? neg. Pt states  punch biopsy at the nipple  ? LYMPH NODE DISSECTION Right   ? neck  ? MANDIBLE SURGERY    ? Sagittal split  ? THROAT SURGERY    ? Vocal cord polyps "burned"  ? TUBAL LIGATION    ? WISDOM TOOTH EXTRACTION    ?.  ? ?ROS: Negative for fever..  ? ?PHYSICAL EXAM  ?Vitals: Blood pressure 129/67, pulse 72, temperature 98.1 ?F (36.7 ?C), temperature source Temporal, resp. rate 16, height '4\' 8"'$  (1.422 m), weight 48.1 kg, SpO2 96 %.Marland Kitchen ?General: Well-developed, Well-nourished in no acute distress ?Mood: Mood and affect well adjusted, pleasant and cooperative. ?Orientation: Grossly alert and oriented. ?Vocal Quality: No hoarseness. Communicates verbally. ?head and Face: NCAT. No facial asymmetry. No visible skin lesions. No significant facial scars. No tenderness with sinus percussion. Facial strength normal and symmetric. ?Respiratory: Normal respiratory effort without labored breathing. ?Cardiovascular:  Carotid pulse shows regular rate and rhythm ? ?MEDICAL DECISION MAKING: ?Data Review:  ?Results for orders placed or performed during the hospital encounter of 07/23/21 (from the past 48 hour(s))  ?Sodium     Status: Abnormal  ? Collection Time: 07/23/21  7:24 AM  ?Result Value Ref Range  ? Sodium 126 (L) 135 - 145 mmol/L  ?  Comment: Performed at [facility], [address] [place], [place] [address]  ?Marland Kitchen No results found..  ? ?ASSESSMENT: Nasopharyngeal mass ? ?PLAN: Nasal endoscopy with biopsies of nasopharyngeal mass ? ? ?Riley Nearing ?07/23/2021 ?9:11 AM  ? ?

## 2021-07-23 NOTE — H&P (Signed)
History and physical reviewed and will be scanned in later. No change in medical status reported by the patient or family, appears stable for surgery. All questions regarding the procedure answered, and patient (or family if a child) expressed understanding of the procedure. ? ?Vickie Caldwell [name] ?@TODAY@ ?

## 2021-07-23 NOTE — Discharge Instructions (Signed)
AMBULATORY SURGERY  ?DISCHARGE INSTRUCTIONS ? ? ?The drugs that you were given will stay in your system until tomorrow so for the next 24 hours you should not: ? ?Drive an automobile ?Make any legal decisions ?Drink any alcoholic beverage ? ? ?You may resume regular meals tomorrow.  Today it is better to start with liquids and gradually work up to solid foods. ? ?You may eat anything you prefer, but it is better to start with liquids, then soup and crackers, and gradually work up to solid foods. ? ? ?Please notify your doctor immediately if you have any unusual bleeding, trouble breathing, redness and pain at the surgery site, drainage, fever, or pain not relieved by medication. ? ? ? ?Additional Instructions: ? ? ? ?Please contact your physician with any problems or Same Day Surgery at [phone], Monday through Friday 6 am to 4 pm, or [place] [facility] number at [phone].  ?

## 2021-07-23 NOTE — Anesthesia Procedure Notes (Signed)
Procedure Name: Intubation ?Date/Time: 07/23/2021 8:36 AM ?Performed by: Loletha Grayer, CRNA ?Pre-anesthesia Checklist: Patient identified, Patient being monitored, Timeout performed, Emergency Drugs available and Suction available ?Patient Re-evaluated:Patient Re-evaluated prior to induction ?Oxygen Delivery Method: Circle system utilized ?Preoxygenation: Pre-oxygenation with 100% oxygen ?Induction Type: IV induction ?Ventilation: Mask ventilation without difficulty ?Laryngoscope Size: 3 and McGraph ?Grade View: Grade I ?Tube type: Oral ?Tube size: 6.5 mm ?Number of attempts: 1 ?Airway Equipment and Method: Stylet ?Placement Confirmation: ETT inserted through vocal cords under direct vision, positive ETCO2 and breath sounds checked- equal and bilateral ?Secured at: 20 cm ?Tube secured with: Tape ?Dental Injury: Teeth and Oropharynx as per pre-operative assessment  ?Comments: Intubated with McGrath and head/neck maintained in neutral position due to patient complaints of pain with neck extension and need for [facility] elevation ? ? ? ? ?

## 2021-07-23 NOTE — Op Note (Signed)
07/23/2021 ? ?9:03 AM ? ? ? ?Vickie Caldwell ? ?[id number] ? ? ?Pre-Op Diagnosis:  Nasopharyngeal mass ? ?Post-op Diagnosis: Nasopharyngeal mass ? ?Procedure:  Nasal endoscopy with biopsies of nasopharyngeal mass ?   ?Surgeon:  Riley Nearing ? ?Anesthesia:  General endotracheal ? ?EBL:  minimal ? ?Complications:  None ? 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  ? ?Procedure: After the patient was identified in holding and the benefits of the procedure were reviewed as well as the consent and risks, the patient was taken to the operating room and with the patient in a comfortable supine position,  general orotracheal anesthesia was induced without difficulty.  A proper time-out was performed.  ? ?Next  Afrin pledgets were placed in the nasopharynx, allowing several minutes to decongest while the patient was prepped and draped in the standard fashion.. 1% Xylocaine with 1:100,000 epinephrine was infiltrated into the nasopharyngeal region bilaterally.  Several minutes were allowed for this to take effect.  A 0 degree scope was then used to visualize the nasopharynx, taking note of the extent of the tumor mass.  Multiple biopsies were then taken of the mass and sent as a fresh specimen in case flow cytometry was necessary.  Bleeding was minimal and easily controlled with Afrin moistened pledgets. ? ?The nose was suctioned and inspected.  Bleeding was felt to be well controlled. ?The patient was then returned to the anesthesiologist for awakening and taken to recovery room in good condition postoperatively. ? ?Disposition:   PACU and d/c home ? ?Plan: Discharge home, Tylenol as needed for pain.  The patient will be contacted with biopsy results and further recommendations. ? ? ?Riley Nearing ?07/23/2021 ?9:03 AM  ?

## 2021-07-24 ENCOUNTER — Encounter: Payer: Self-pay | Admitting: Otolaryngology

## 2021-07-25 ENCOUNTER — Encounter: Payer: Self-pay | Admitting: Otolaryngology

## 2021-07-30 LAB — SURGICAL PATHOLOGY

## 2021-08-01 ENCOUNTER — Other Ambulatory Visit: Payer: Self-pay | Admitting: Otolaryngology

## 2021-08-01 DIAGNOSIS — C119 Malignant neoplasm of nasopharynx, unspecified: Secondary | ICD-10-CM

## 2021-08-07 ENCOUNTER — Ambulatory Visit
Admission: RE | Admit: 2021-08-07 | Discharge: 2021-08-07 | Disposition: A | Discharge status: Home / Self Care | Payer: Medicare HMO | Source: Ambulatory Visit | Attending: Otolaryngology | Admitting: Otolaryngology

## 2021-08-07 DIAGNOSIS — J439 Emphysema, unspecified: Secondary | ICD-10-CM | POA: Insufficient documentation

## 2021-08-07 DIAGNOSIS — C779 Secondary and unspecified malignant neoplasm of lymph node, unspecified: Secondary | ICD-10-CM | POA: Diagnosis not present

## 2021-08-07 DIAGNOSIS — C119 Malignant neoplasm of nasopharynx, unspecified: Secondary | ICD-10-CM | POA: Diagnosis not present

## 2021-08-07 LAB — GLUCOSE, CAPILLARY: Glucose-Capillary: 113 mg/dL — ABNORMAL HIGH (ref 70–99)

## 2021-08-07 MED ORDER — FLUDEOXYGLUCOSE F - 18 (FDG) INJECTION
5.9600 | Freq: Once | INTRAVENOUS | Status: AC | PRN
  Administered 2021-08-07: 5.96 via INTRAVENOUS

## 2021-08-11 ENCOUNTER — Telehealth: Payer: Self-pay

## 2021-08-11 ENCOUNTER — Inpatient Hospital Stay: Payer: Medicare HMO

## 2021-08-11 ENCOUNTER — Inpatient Hospital Stay: Payer: Medicare HMO | Attending: Internal Medicine | Admitting: Internal Medicine

## 2021-08-11 ENCOUNTER — Encounter: Payer: Self-pay | Admitting: Internal Medicine

## 2021-08-11 VITALS — BP 121/79 | HR 83 | Temp 97.9°F | Ht <= 58 in | Wt 107.4 lb

## 2021-08-11 DIAGNOSIS — C770 Secondary and unspecified malignant neoplasm of lymph nodes of head, face and neck: Secondary | ICD-10-CM | POA: Diagnosis not present

## 2021-08-11 DIAGNOSIS — F17210 Nicotine dependence, cigarettes, uncomplicated: Secondary | ICD-10-CM | POA: Insufficient documentation

## 2021-08-11 DIAGNOSIS — Z5111 Encounter for antineoplastic chemotherapy: Secondary | ICD-10-CM | POA: Diagnosis not present

## 2021-08-11 DIAGNOSIS — Z79899 Other long term (current) drug therapy: Secondary | ICD-10-CM | POA: Insufficient documentation

## 2021-08-11 DIAGNOSIS — J449 Chronic obstructive pulmonary disease, unspecified: Secondary | ICD-10-CM | POA: Insufficient documentation

## 2021-08-11 DIAGNOSIS — C119 Malignant neoplasm of nasopharynx, unspecified: Secondary | ICD-10-CM

## 2021-08-11 DIAGNOSIS — E871 Hypo-osmolality and hyponatremia: Secondary | ICD-10-CM

## 2021-08-11 DIAGNOSIS — I10 Essential (primary) hypertension: Secondary | ICD-10-CM | POA: Diagnosis not present

## 2021-08-11 LAB — COMPREHENSIVE METABOLIC PANEL
ALT: 19 U/L (ref 0–44)
AST: 22 U/L (ref 15–41)
Albumin: 4.1 g/dL (ref 3.5–5.0)
Alkaline Phosphatase: 88 U/L (ref 38–126)
Anion gap: 6 (ref 5–15)
BUN: 6 mg/dL — ABNORMAL LOW (ref 8–23)
CO2: 28 mmol/L (ref 22–32)
Calcium: 9 mg/dL (ref 8.9–10.3)
Chloride: 96 mmol/L — ABNORMAL LOW (ref 98–111)
Creatinine, Ser: 0.56 mg/dL (ref 0.44–1.00)
GFR, Estimated: >60 mL/min (ref >60)
Glucose, Bld: 106 mg/dL — ABNORMAL HIGH (ref 70–99)
Potassium: 3.6 mmol/L (ref 3.5–5.1)
Sodium: 130 mmol/L — ABNORMAL LOW (ref 135–145)
Total Bilirubin: 0.6 mg/dL (ref 0.3–1.2)
Total Protein: 7.7 g/dL (ref 6.5–8.1)

## 2021-08-11 LAB — MAGNESIUM: Magnesium: 2.1 mg/dL (ref 1.7–2.4)

## 2021-08-11 LAB — CBC WITH DIFFERENTIAL/PLATELET
Abs Immature Granulocytes: 0.04 10*3/uL (ref 0.00–0.07)
Basophils Absolute: 0 10*3/uL (ref 0.0–0.1)
Basophils Relative: 0 %
Eosinophils Absolute: 0.2 10*3/uL (ref 0.0–0.5)
Eosinophils Relative: 2 %
HCT: 40.8 % (ref 36.0–46.0)
Hemoglobin: 13.2 g/dL (ref 12.0–15.0)
Immature Granulocytes: 0 %
Lymphocytes Relative: 13 %
Lymphs Abs: 1.3 10*3/uL (ref 0.7–4.0)
MCH: 29.6 pg (ref 26.0–34.0)
MCHC: 32.4 g/dL (ref 30.0–36.0)
MCV: 91.5 fL (ref 80.0–100.0)
Monocytes Absolute: 0.8 10*3/uL (ref 0.1–1.0)
Monocytes Relative: 9 %
Neutro Abs: 7.2 10*3/uL (ref 1.7–7.7)
Neutrophils Relative %: 76 %
Platelets: 411 10*3/uL — ABNORMAL HIGH (ref 150–400)
RBC: 4.46 MIL/uL (ref 3.87–5.11)
RDW: 12.1 % (ref 11.5–15.5)
WBC: 9.5 10*3/uL (ref 4.0–10.5)
nRBC: 0 % (ref 0.0–0.2)

## 2021-08-11 LAB — SODIUM, URINE, RANDOM: Sodium, Ur: 15 mmol/L

## 2021-08-11 LAB — OSMOLALITY, URINE: Osmolality, Ur: 71 mOsm/kg — ABNORMAL LOW (ref 300–900)

## 2021-08-11 LAB — OSMOLALITY: Osmolality: 282 mOsm/kg (ref 275–295)

## 2021-08-11 MED ORDER — PROCHLORPERAZINE MALEATE 10 MG PO TABS: 10.0000 mg | ORAL_TABLET | Freq: Four times a day (QID) | ORAL | 1 refills | Status: DC | PRN

## 2021-08-11 MED ORDER — LIDOCAINE-PRILOCAINE 2.5-2.5 % EX CREA
TOPICAL_CREAM | CUTANEOUS | 3 refills | Status: AC
Start: 2021-08-11 — End: ?

## 2021-08-11 MED ORDER — ONDANSETRON HCL 8 MG PO TABS
ORAL_TABLET | ORAL | 1 refills | Status: AC
Start: 2021-08-11 — End: ?

## 2021-08-11 NOTE — Progress Notes (Signed)
[place] ?CONSULT NOTE ? ?Patient Care Team: ?Revelo, Elyse Jarvis, MD as PCP - General (Family Medicine) ? ?CHIEF COMPLAINTS/PURPOSE OF CONSULTATION: head and neck cancer ? ?#  ?Oncology History Overview Note  ?# April 26th, 2023-  NASOPHARYNGEAL MASS; ENDOSCOPIC BIOPSY:  ?- POORLY DIFFERENTIATED CARCINOMA, WITH SQUAMOUS DIFFERENTIATION AND  KERATINIZATION. EBV-NEGATIVE. [Dr.Bennett: ] ? ?Comment:  ?Immunohistochemical studies show tumor cells to be strongly and  ?diffusely positive for CK5/6 and p40, and negative for S100, CD45 (LCA),  ?desmin, and p16 ? ?#  PET sacn May 5th, 2023-  Posterior nasopharyngeal mass maximum SUV 12.7, compatible with malignancy; Hypermetabolic right level IIb and right level IV lymph nodes compatible with malignant spread. ?3. A 4 mm in short axis left level IIb lymph node has a maximum SUV ?of 2.1 which is about at the level of the blood pool, probably not ?involved. ?4. Other imaging findings of potential clinical significance: Aortic ?Atherosclerosis (ICD10-I70.0). Coronary and systemic ?atherosclerosis. Emphysema (ICD10-J43.9). Sigmoid colon ?diverticulosis. Scoliosis. ?  ?  ?Electronically Signed ?  By: Van Clines M.D. ?  On: 08/08/2021 10:56 ?  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  ?08/11/2021 Cancer Staging  ? Staging form: Pharynx - Nasopharynx, AJCC 8th Edition ?- Clinical: Stage II (cT2, cN1, cM0) - Signed by Cammie Sickle, MD on 08/11/2021 ?Stage prefix: Initial diagnosis ? ?  ?08/12/2021 -  Chemotherapy  ? Patient is on Treatment Plan : HEAD/NECK Cisplatin q7d + XRT x 6 Cycles / Cisplatin D1 + 5FU IVCI D1-4 q28d x 3 Cycles  ? ?  ?  ? ? ? ?HISTORY OF PRESENTING ILLNESS:  ?Vickie Caldwell 71 y.o.  female history of smoking with no significant prior history of prior malignancy-has been referred to [place] for further evaluation of newly diagnosed nasopharyngeal carcinoma.  ? ?Patient noted to  have worsening symptoms since NOV 2022- bilateral headaches; difficulty swallowing which led to further evaluation with ENT.  Patient had a endoscopy in the clinic that was concerning for mass in the nasopharynx.  Patient went on to evaluated in the OR/with biopsy.  Patient also had a PET scan.  Patient is here to review the treatment options. ? ?Patient notes to have a history of chronic hyponatremia.  ? ?Review of Systems  ?Constitutional:  Positive for malaise/fatigue. Negative for chills, diaphoresis and fever.  ?HENT:  Negative for nosebleeds and sore throat.   ?Eyes:  Negative for double vision.  ?Respiratory:  Negative for cough, hemoptysis, sputum production, shortness of breath and wheezing.   ?Cardiovascular:  Negative for chest pain, palpitations, orthopnea and leg swelling.  ?Gastrointestinal:  Negative for abdominal pain, blood in stool, constipation, diarrhea, heartburn, melena, nausea and vomiting.  ?Genitourinary:  Negative for dysuria, frequency and urgency.  ?Musculoskeletal:  Positive for back pain and joint pain.  ?Skin: Negative.  Negative for itching and rash.  ?Neurological:  Positive for headaches. Negative for dizziness, tingling, focal weakness and weakness.  ?Endo/Heme/Allergies:  Does not bruise/bleed easily.  ?Psychiatric/Behavioral:  Negative for depression. The patient is not nervous/anxious and does not have insomnia.    ? ?MEDICAL HISTORY:  ?Past Medical History:  ?Diagnosis Date  ? Asthma   ? COPD (chronic obstructive pulmonary disease) ([place])   ? Dysrhythmia   ? GERD (gastroesophageal reflux disease)   ? History of kidney stones   ? Hypertension   ? Osteoarthritis   ? TIA (transient ischemic attack) 2011  ? No Deficits  ?  Vertigo   ? ? ?SURGICAL HISTORY: ?Past Surgical History:  ?Procedure Laterality Date  ? ABDOMINAL HYSTERECTOMY    ? BREAST BIOPSY Left 1980's  ? neg. Pt states punch biopsy at the nipple  ? LYMPH NODE DISSECTION Right   ? neck  ? MANDIBLE SURGERY    ? Sagittal  split  ? NASAL ENDOSCOPY Bilateral 07/23/2021  ? Procedure: NASAL ENDOSCOPY WITH BIOPSY OF NASOPHARYNGEAL MASS;  Surgeon: Clyde Canterbury, MD;  Location: [facility] ORS;  Service: ENT;  Laterality: Bilateral;  ? THROAT SURGERY    ? Vocal cord polyps "burned"  ? TUBAL LIGATION    ? WISDOM TOOTH EXTRACTION    ? ? ?SOCIAL HISTORY: ?Social History  ? ?Socioeconomic History  ? Marital status: Single  ?  Spouse name: Not on file  ? Number of children: Not on file  ? Years of education: Not on file  ? Highest education level: Not on file  ?Occupational History  ? Not on file  ?Tobacco Use  ? Smoking status: Every Day  ?  Packs/day: 1.00  ?  Years: 51.00  ?  Pack years: 51.00  ?  Types: Cigarettes  ? Smokeless tobacco: Never  ? Tobacco comments:  ?  Started smoking around age 33  ?Vaping Use  ? Vaping Use: Never used  ?Substance and Sexual Activity  ? Alcohol use: Yes  ?  Alcohol/week: 1.0 standard drink  ?  Types: 1 Cans of beer per week  ?  Comment: occasional  ? Drug use: Never  ? Sexual activity: Not on file  ?Other Topics Concern  ? Not on file  ?Social History Narrative  ? 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 Lives in [place]. With daughter-ZES//brother. From [place].   ? ?Social Determinants of Health  ? ?Financial Resource Strain: Not on file  ?Food Insecurity: Not on file  ?Transportation Needs: Not on file  ?Physical Activity: Not on file  ?Stress: Not on file  ?Social Connections: Not on file  ?Intimate Partner Violence: Not on file  ? ? ?FAMILY HISTORY: ?Family History  ?Problem Relation Age of Onset  ? Prostate cancer Brother   ? Breast cancer Neg Hx   ? ? ?ALLERGIES:  is allergic to codeine and penicillins. ? ?MEDICATIONS:  ?Current Outpatient Medications  ?Medication Sig Dispense Refill  ? acetaminophen (TYLENOL) 325 MG tablet Take 650 mg by mouth every 6 (six) hours as needed.    ? albuterol (VENTOLIN HFA) 108 (90 Base) MCG/ACT inhaler Inhale into the lungs every 6  (six) hours as needed for wheezing or shortness of breath.    ? amLODipine (NORVASC) 10 MG tablet Take 10 mg by mouth daily.    ? ASPIRIN 81 PO Take by mouth daily.    ? Cholecalciferol (VITAMIN D3 PO) Take by mouth daily.    ? famotidine (PEPCID) 10 MG tablet Take 20 mg by mouth daily.    ? fluticasone (FLONASE) 50 MCG/ACT nasal spray Place into both nostrils daily.    ? lidocaine-prilocaine (EMLA) cream Apply on the port. 30 -45 min  prior to port access. 30 g 3  ? metoprolol succinate (TOPROL-XL) 100 MG 24 hr tablet Take 100 mg by mouth daily. Take with or immediately following a meal.    ? montelukast (SINGULAIR) 10 MG tablet Take 10 mg by mouth daily.    ? ondansetron (ZOFRAN) 8 MG tablet One pill every 8 hours as needed for nausea/vomitting. 40 tablet 1  ?  prochlorperazine (COMPAZINE) 10 MG tablet Take 1 tablet (10 mg total) by mouth every 6 (six) hours as needed for nausea or vomiting. 40 tablet 1  ? umeclidinium bromide (INCRUSE ELLIPTA) 62.5 MCG/ACT AEPB Inhale 1 puff into the lungs daily.    ? HYDROcodone-acetaminophen (NORCO) 10-325 MG tablet Take 1 tablet by mouth every 6 (six) hours as needed.    ? ?No current facility-administered medications for this visit.  ? ? ? ?PHYSICAL EXAMINATION: ?ECOG PERFORMANCE STATUS: 1 - Symptomatic but completely ambulatory ? ?Vitals:  ? 08/11/21 1132  ?BP: 121/79  ?Pulse: 83  ?Temp: 97.9 ?F (36.6 ?C)  ?SpO2: 99%  ? ?Filed Weights  ? 08/11/21 1132  ?Weight: 107 lb 6.4 oz (48.7 kg)  ? ? ?Physical Exam ?Vitals and nursing note reviewed.  ?HENT:  ?   Head: Normocephalic and atraumatic.  ?   Mouth/Throat:  ?   Pharynx: Oropharynx is clear.  ?Eyes:  ?   Extraocular Movements: Extraocular movements intact.  ?   Pupils: Pupils are equal, round, and reactive to light.  ?Cardiovascular:  ?   Rate and Rhythm: Normal rate and regular rhythm.  ?Pulmonary:  ?   Comments: Decreased breath sounds bilaterally.  ?Abdominal:  ?   Palpations: Abdomen is soft.  ?Musculoskeletal:     ?    General: Normal range of motion.  ?   Cervical back: Normal range of motion.  ?Skin: ?   General: Skin is warm.  ?Neurological:  ?   General: No focal deficit present.  ?   Mental Status: She is alert and ori

## 2021-08-11 NOTE — Progress Notes (Signed)
START ON PATHWAY REGIMEN - Head and Neck ? ? ?Cisplatin 40 mg/m2 IV D1 q7 Days + RT: ?  A cycle is every 7 days: ?    Cisplatin  ? ? ?Cisplatin 80 mg/m2 IV D1 + Fluorouracil 1,000 mg/m2/day CIV D1,2,3,4 q28 Days: ?  A cycle is every 28 days: ?    Cisplatin  ?    Fluorouracil  ? ?**Always confirm dose/schedule in your pharmacy ordering system** ? ?Patient Characteristics: ?Nasopharyngeal, Stage II - IVA ?Disease Classification: Nasopharyngeal ?Current Disease Status: No Distant Metastases and No Recurrent Disease ?AJCC T Category: T2 ?AJCC N Category: N1 ?AJCC M Category: M0 ?AJCC 8 Stage Grouping: II ?Intent of Therapy: ?Curative Intent, Discussed with Patient ?

## 2021-08-11 NOTE — Telephone Encounter (Signed)
Per Perry Mount: Thurs 5/11 at 11a and to arrive at 10a.  The IR Nurse will call and go over instructions a few days prior to her procedure. ? ?Pt notified of date and time, this works for her. Marcie Bal notified. ?

## 2021-08-11 NOTE — Telephone Encounter (Addendum)
Message was sent to [facility] to confirm. ?

## 2021-08-11 NOTE — Telephone Encounter (Signed)
Order faxed to [facility] for IR port placement asap. ?

## 2021-08-11 NOTE — Assessment & Plan Note (Addendum)
#   April 26th, 2023-  NASOPHARYNGEAL MASS; ENDOSCOPIC BIOPSY:  ?- POORLY DIFFERENTIATED CARCINOMA, WITH SQUAMOUS DIFFERENTIATION AND  KERATINIZATION. EBV-NEGATIVE. [Dr.Bennett: ]; STAGE II [T2 N1]; #  PET scan May 5th, 2023-  Posterior nasopharyngeal mass maximum SUV 12.7, compatible with malignancy; Hypermetabolic right level IIb and right level IV lymph nodes compatible with malignant spread. 3. A 4 mm in short axis left level IIb lymph node has a maximum SUV of 2.1 which is about at the level of the blood pool, probably not ?Involved.  Will review at tumor conference.  We also discussed with Dr. Richardson Landry. ? ?# I reviewed with the patient the option of definitive chemotherapy and radiation therapy concurrently.  Usually the treatments last 6-8 week [ with weekly chemotherapy and radiation therapy offered Monday through Friday.  Surgery could be reserved for residual/recurrent malignancy. Discussed that would recommend reimaging with a PET scan 8 to 12 weeks post chemoradiation. Given the nasopharyngeal disease -patient will also benefit from adjuvant chemotherapy post chemoradiation.   ? ?# I discussed that the treatments are fairly intensive; but the goal would be cure.  Radiation oncology referral.  Discussed with the patient that we will plan to coordinate the treatment along with start of radiation therapy. ? ?#Risk of weight loss / difficulty swallowing-counseled the patient regarding potential weight loss; moderate to severe mucositis from chemoradiation.  Discussed that if significant weight loss noted recommend PEG tube placement.  We will make a referral to [facility] nutrition. ? ?# IV access/Mediport-discussed pros and cons of Mediport placement.  Patient agrees; proceed with port placement. ? ?# Smoking: 1ppd/day; recommend quitting smoking.  ? ?# Chronic Hyponatremia: ? 126-check labs. ?  As per patient 1 functioning kidney.  Given upcoming exposure to cisplatin which is nephrotoxic-we will refer patient to  nephrology. ? ?Thank you Dr.Bennett for allowing me to participate in the care of your pleasant patient. Please do not hesitate to contact me with questions or concerns in the interim. ? ?# DISPOSITION: ?# chemo education [re; weekly cisplatin with RT] ?# Joli/nutrition re: head and neck cancer ?# referral to Nephrology re: hyponatremia- ASAP ?# port placement ASAP- IR  ?# Referral to Dr.Crystal re: head and neck cancer- ASAP ?# labs today- ordered today ?# follow up in week of May 22nd- MD; labs- cbc/cmp; mag; Csiplatin weekly- Dr.B ? ?# I reviewed the blood work- with the patient in detail; also reviewed the imaging independently [as summarized above]; and with the patient in detail.  ? ? ? ?

## 2021-08-12 ENCOUNTER — Encounter: Payer: Self-pay | Admitting: Internal Medicine

## 2021-08-12 LAB — THYROID PANEL WITH TSH
Free Thyroxine Index: 1.8 (ref 1.2–4.9)
T3 Uptake Ratio: 24 % (ref 24–39)
T4, Total: 7.4 ug/dL (ref 4.5–12.0)
TSH: 0.763 u[IU]/mL (ref 0.450–4.500)

## 2021-08-12 NOTE — Progress Notes (Signed)
Spoke with patient 08/12/21 @ 13:15. Went over pre-procedure instructions to include the need to arrive at 10:00 for 11:00 procedure, need to be NPO after 5 am morning of procedure, need to hold aspirin day of procedure and need for driver post procedure. Patient verbalized understanding.  ?

## 2021-08-13 ENCOUNTER — Ambulatory Visit
Admission: RE | Admit: 2021-08-13 | Discharge: 2021-08-13 | Disposition: A | Discharge status: Home / Self Care | Payer: Medicare HMO | Source: Ambulatory Visit | Attending: Radiation Oncology | Admitting: Radiation Oncology

## 2021-08-13 ENCOUNTER — Encounter: Payer: Self-pay | Admitting: Radiation Oncology

## 2021-08-13 ENCOUNTER — Other Ambulatory Visit: Payer: Self-pay | Admitting: Radiology

## 2021-08-13 VITALS — BP 123/73 | HR 73 | Temp 97.1°F | Resp 20 | Wt 103.0 lb

## 2021-08-13 DIAGNOSIS — K219 Gastro-esophageal reflux disease without esophagitis: Secondary | ICD-10-CM | POA: Insufficient documentation

## 2021-08-13 DIAGNOSIS — Z7982 Long term (current) use of aspirin: Secondary | ICD-10-CM | POA: Insufficient documentation

## 2021-08-13 DIAGNOSIS — Z809 Family history of malignant neoplasm, unspecified: Secondary | ICD-10-CM | POA: Diagnosis not present

## 2021-08-13 DIAGNOSIS — I10 Essential (primary) hypertension: Secondary | ICD-10-CM | POA: Diagnosis not present

## 2021-08-13 DIAGNOSIS — C119 Malignant neoplasm of nasopharynx, unspecified: Secondary | ICD-10-CM | POA: Insufficient documentation

## 2021-08-13 DIAGNOSIS — Z79899 Other long term (current) drug therapy: Secondary | ICD-10-CM | POA: Diagnosis not present

## 2021-08-13 DIAGNOSIS — Z8673 Personal history of transient ischemic attack (TIA), and cerebral infarction without residual deficits: Secondary | ICD-10-CM | POA: Insufficient documentation

## 2021-08-13 DIAGNOSIS — J449 Chronic obstructive pulmonary disease, unspecified: Secondary | ICD-10-CM | POA: Insufficient documentation

## 2021-08-13 DIAGNOSIS — Z87442 Personal history of urinary calculi: Secondary | ICD-10-CM | POA: Insufficient documentation

## 2021-08-13 DIAGNOSIS — M1990 Unspecified osteoarthritis, unspecified site: Secondary | ICD-10-CM | POA: Insufficient documentation

## 2021-08-13 DIAGNOSIS — F17210 Nicotine dependence, cigarettes, uncomplicated: Secondary | ICD-10-CM | POA: Insufficient documentation

## 2021-08-13 DIAGNOSIS — R519 Headache, unspecified: Secondary | ICD-10-CM | POA: Diagnosis not present

## 2021-08-13 NOTE — H&P (Signed)
Chief Complaint: Patient was seen in consultation today for tunneled catheter with port placement   at the request of Brahmanday,Govinda R  Referring Physician(s): Earna Coder  Supervising Physician: Mir, Mauri Reading  Patient Status: [facility] - Out-pt  History of Present Illness: Vickie Caldwell is a 71 y.o. female w/ PMH of asthma, COPD, GERD, HTN, TIA and vertigo. Pt c/o headaches and dysphagia and was referred to ENT. Pt was found to have nasopharyngeal mass. 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 Dr. Donneta Romberg referred pt to IR for tunneled catheter with port placement to start chemotherapy.   Past Medical History:  Diagnosis Date   Asthma    COPD (chronic obstructive pulmonary disease) ([facility])    Dysrhythmia    GERD (gastroesophageal reflux disease)    History of kidney stones    Hypertension    Osteoarthritis    TIA (transient ischemic attack) 2011   No Deficits   Vertigo     Past Surgical History:  Procedure Laterality Date   ABDOMINAL HYSTERECTOMY     BREAST BIOPSY Left 1980's   neg. Pt states punch biopsy at the nipple   LYMPH NODE DISSECTION Right    neck   MANDIBLE SURGERY     Sagittal split   NASAL ENDOSCOPY Bilateral 07/23/2021   Procedure: NASAL ENDOSCOPY WITH BIOPSY OF NASOPHARYNGEAL MASS;  Surgeon: Geanie Logan, MD;  Location: [facility] ORS;  Service: ENT;  Laterality: Bilateral;   THROAT SURGERY     Vocal cord polyps "burned"   TUBAL LIGATION     WISDOM TOOTH EXTRACTION      Allergies: Codeine and Penicillins  Medications: Prior to Admission medications   Medication Sig Start Date End Date Taking? Authorizing Provider  acetaminophen (TYLENOL) 325 MG tablet Take 650 mg by mouth every 6 (six) hours as needed.    [provider]  albuterol (VENTOLIN HFA) 108 (90 Base) MCG/ACT inhaler Inhale into the lungs every 6 (six) hours as needed for wheezing or shortness of breath.    [provider]  amLODipine (NORVASC)  10 MG tablet Take 10 mg by mouth daily.    [provider]  ASPIRIN 81 PO Take by mouth daily.    [provider]  Cholecalciferol (VITAMIN D3 PO) Take by mouth daily.    [provider]  famotidine (PEPCID) 10 MG tablet Take 20 mg by mouth daily.    [provider]  fluticasone (FLONASE) 50 MCG/ACT nasal spray Place into both nostrils daily.    [provider]  HYDROcodone-acetaminophen (NORCO) 10-325 MG tablet Take 1 tablet by mouth every 6 (six) hours as needed. 08/08/21   [provider]  lidocaine-prilocaine (EMLA) cream Apply on the port. 30 -45 min  prior to port access. 08/11/21   Earna Coder, MD  metoprolol succinate (TOPROL-XL) 100 MG 24 hr tablet Take 100 mg by mouth daily. Take with or immediately following a meal.    [provider]  montelukast (SINGULAIR) 10 MG tablet Take 10 mg by mouth daily.    [provider]  ondansetron (ZOFRAN) 8 MG tablet One pill every 8 hours as needed for nausea/vomitting. 08/11/21   Earna Coder, MD  prochlorperazine (COMPAZINE) 10 MG tablet Take 1 tablet (10 mg total) by mouth every 6 (six) hours as needed for nausea or vomiting. 08/11/21   Earna Coder, MD  umeclidinium bromide (INCRUSE ELLIPTA) 62.5 MCG/ACT AEPB Inhale 1 puff into the lungs daily.    [provider]  Family History  Problem Relation Age of Onset   Coronary artery disease Mother    Cancer Father 73       Oral   Cancer Brother 33       testicular   Prostate cancer Brother    Heart failure Brother    Breast cancer Neg Hx     Social History   Socioeconomic History   Marital status: Single    Spouse name: Not on file   Number of children: Not on file   Years of education: Not on file   Highest education level: Not on file  Occupational History   Not on file  Tobacco Use   Smoking status: Every Day    Packs/day: 1.00    Years: 51.00    Pack years: 51.00    Types:  Cigarettes   Smokeless tobacco: Never   Tobacco comments:    Started smoking around age 15  Vaping Use   Vaping Use: Never used  Substance and Sexual Activity   Alcohol use: Yes    Alcohol/week: 1.0 standard drink    Types: 1 Cans of beer per week    Comment: occasional   Drug use: Never   Sexual activity: Not Currently    Birth control/protection: None  Other Topics Concern   Not on file  Social History Narrative   Smoker; sometimes beer/wine; 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 Lives in [place]. With daughter-ZES//brother. From [place].    Social Determinants of Health   Financial Resource Strain: Not on file  Food Insecurity: Not on file  Transportation Needs: Not on file  Physical Activity: Not on file  Stress: Not on file  Social Connections: Not on file    Review of Systems: A 12 point ROS discussed and pertinent positives are indicated in the HPI above.  All other systems are negative.  Review of Systems  Constitutional:  Positive for appetite change. Negative for chills, fatigue and unexpected weight change.  Eyes:  Positive for visual disturbance.  Respiratory:  Negative for cough and shortness of breath.   Cardiovascular:  Negative for chest pain and leg swelling.  Gastrointestinal:  Negative for abdominal pain, nausea and vomiting.  Neurological:  Positive for dizziness and headaches.   Vital Signs: There were no vitals taken for this visit.  Physical Exam Vitals reviewed.  Constitutional:      General: She is not in acute distress.    Appearance: Normal appearance. She is not ill-appearing.  HENT:     Head: Normocephalic and atraumatic.     Mouth/Throat:     Mouth: Mucous membranes are moist.     Pharynx: Oropharynx is clear.  Eyes:     Extraocular Movements: Extraocular movements intact.     Pupils: Pupils are equal, round, and reactive to light.  Cardiovascular:     Rate and Rhythm: Normal rate and regular rhythm.      Pulses: Normal pulses.     Heart sounds: Normal heart sounds.  Pulmonary:     Effort: Pulmonary effort is normal. No respiratory distress.     Breath sounds: Wheezing present.     Comments: Expiratory wheezing in all fields Abdominal:     General: Bowel sounds are normal. There is no distension.     Palpations: Abdomen is soft.     Tenderness: There is no abdominal tenderness. There is no guarding.  Musculoskeletal:     Right lower leg: No edema.     Left  lower leg: No edema.  Skin:    General: Skin is warm and dry.  Neurological:     Mental Status: She is alert and oriented to person, place, and time.  Psychiatric:        Mood and Affect: Mood normal.        Behavior: Behavior normal.        Thought Content: Thought content normal.        Judgment: Judgment normal.    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 EXAM: NUCLEAR MEDICINE PET SKULL BASE TO THIGH TECHNIQUE: 6.0 mCi F-18 FDG was injected intravenously. Full-ring PET imaging was performed from the skull base to thigh after the radiotracer. CT data was obtained and used for attenuation correction and anatomic localization. Fasting blood glucose: 114 mg/dl COMPARISON:  Chest CT 16/01/9603 FINDINGS: Mediastinal blood pool activity: SUV max 2.0 Liver activity: SUV max NA NECK: Abnormal accentuated activity along the posterior nasopharynx along the mucosal pharyngeal space, with right greater than left extension potentially along the right prevertebral space, with maximum SUV 12.7. Several clustered right level IIb lymph nodes are hypermetabolic, the largest of these is a 1.0 cm lymph node on image 42 of series 2 with maximum SUV 6.5 A left level IIb lymph node on image 42 of series 2 has a short axis diameter of 0.4 cm and a maximum SUV of 2.1  which is near blood pool. A right level IV lymph node measuring 0.9 cm in short axis on image 60 of series 2 has a maximum SUV of 9.8. Just above the thoracic inlet along the posterior left thyroid lobe, a 1.4 by 0.9 cm hypodense lesion is present on image 63 of series 2, maximum SUV 1.9 which is not greater than the rest of the thyroid gland. This finding was also present on the prior chest CT of 09/23/2017 and is probably a small thyroid nodule rather than a lymph node. Not clinically significant; no follow-up imaging recommended (ref: J Am Coll Radiol. 2015 Feb;12(2): 143-50). Incidental CT findings: Bilateral common carotid atherosclerotic calcification. CHEST: No significant abnormal hypermetabolic activity in this region. Incidental CT findings: Coronary, aortic arch, and branch vessel atherosclerotic vascular disease. Centrilobular emphysema. ABDOMEN/PELVIS: No significant abnormal hypermetabolic activity in this region. Incidental CT findings: Atherosclerosis is present, including aortoiliac atherosclerotic disease. Sigmoid colon diverticulosis. SKELETON: No significant abnormal hypermetabolic activity in this region. Incidental CT findings: Thoracolumbar scoliosis. Old healed right posterior rib fractures. IMPRESSION: 1. Posterior nasopharyngeal mass maximum SUV 12.7, compatible with malignancy. 2. Hypermetabolic right level IIb and right level IV lymph nodes compatible with malignant spread. 3. A 4 mm in short axis left level IIb lymph node has a maximum SUV of 2.1 which is about at the level of the blood pool, probably not involved. 4. Other imaging findings of potential clinical significance: Aortic Atherosclerosis (ICD10-I70.0). Coronary and systemic atherosclerosis. Emphysema (ICD10-J43.9). Sigmoid colon diverticulosis. Scoliosis. Electronically Signed   By: Gaylyn Rong M.D.   On: 08/08/2021 10:56    Labs:  CBC: Recent Labs    07/18/21 1042 08/11/21 1238  WBC 9.7 9.5  HGB 12.3 13.2   HCT 38.1 40.8  PLT 435* 411*    COAGS: No results for input(s): INR, APTT in the last 8760 hours.  BMP: Recent Labs    07/18/21 1042 07/23/21 0724 08/11/21 1238  NA 126*  126* 130*  K 3.6  --  3.6  CL 91*  --  96*  CO2 25  --  28  GLUCOSE 135*  --  106*  BUN 7*  --  6*  CALCIUM 9.4  --  9.0  CREATININE 0.41*  --  0.56  GFRNONAA >60  --  >60    LIVER FUNCTION TESTS: Recent Labs    08/11/21 1238  BILITOT 0.6  AST 22  ALT 19  ALKPHOS 88  PROT 7.7  ALBUMIN 4.1    TUMOR MARKERS: No results for input(s): AFPTM, CEA, CA199, CHROMGRNA in the last 8760 hours.  Assessment and Plan: History of asthma, COPD, GERD, HTN, TIA and vertigo. Pt c/o headaches and dysphagia and was referred to ENT. Pt was found to have nasopharyngeal mass. 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 Dr. Donneta Romberg referred pt to IR for tunneled catheter with port placement to start chemotherapy.   Pt resting on stretcher with family at bedside.  She is A&O, calm and pleasant.  She is in no distress.  Pt states she is NPO per order.   Risks and benefits of image guided tunneled catheter with port placement with moderate sedation was discussed with the patient including, but not limited to bleeding, infection, pneumothorax, or fibrin sheath development and need for additional procedures.  All of the patient's questions were answered, patient is agreeable to proceed. Consent signed and in chart.   Thank you for this interesting consult.  I greatly enjoyed meeting Vickie Caldwell and look forward to participating in their care.  A copy of this report was sent to the requesting provider on this date.  Electronically Signed: Shon Hough, NP 08/14/2021, 11:03 AM   I spent a total of 20 minutes in face to face in clinical consultation, greater than 50% of which was counseling/coordinating care for tunneled catheter with port placement.

## 2021-08-13 NOTE — Consult Note (Signed)
?NEW PATIENT EVALUATION ? ?Name: Vickie Caldwell  ?MRN: [id number]  ?Date:   08/13/2021     ?DOB: Jun 08, 1950 ? ? ?This 71 y.o. 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 ? ?REFERRING PHYSICIAN: Revelo, Elyse Jarvis* ? ?CHIEF COMPLAINT:  ?Chief Complaint  ?Patient presents with  ? Malignant neoplasm of the nasopharyngeal wall  ? ? ?DIAGNOSIS: The encounter diagnosis was Nasopharyngeal carcinoma ([place]). ?  ?PREVIOUS INVESTIGATIONS:  ?PET/CT and CT scans reviewed ?Pathology reports reviewed ?Clinical notes reviewed ? ?HPI: Patient is a 71 year old female who presents with bilateral headaches and some difficulty swallowing which presented her to ENT.  Endoscopy at that time showed a mass in the nasopharynx.  She underwent OR assisted biopsy which was positive for poorly differentiated carcinoma with squamous differentiation and keratinization.  Tumor was EBV negative.  PET CT scan was performed showing posterior nasopharynx mass highly had hypermetabolic compatible with malignancy.  She also had right level 2B and right level 4 lymph nodes which were hypermetabolic compatible with malignant adenopathy.  She has been seen by medical oncology with recommendation for concurrent chemoradiation she is now referred to ration collagen a.  She is doing fairly well at this point although continues with significant headaches.  She states her eyesight is a little bit blurrier. ? ?PLANNED TREATMENT REGIMEN: Concurrent chemoradiation ? ?PAST MEDICAL HISTORY:  has a past medical history of Asthma, COPD (chronic obstructive pulmonary disease) ([place]), Dysrhythmia, GERD (gastroesophageal reflux disease), History of kidney stones, Hypertension, Osteoarthritis, TIA (transient ischemic attack) (2011), and Vertigo.   ? ?PAST SURGICAL HISTORY:  ?Past Surgical History:  ?Procedure Laterality  Date  ? ABDOMINAL HYSTERECTOMY    ? BREAST BIOPSY Left 1980's  ? neg. Pt states punch biopsy at the nipple  ? LYMPH NODE DISSECTION Right   ? neck  ? MANDIBLE SURGERY    ? Sagittal split  ? NASAL ENDOSCOPY Bilateral 07/23/2021  ? Procedure: NASAL ENDOSCOPY WITH BIOPSY OF NASOPHARYNGEAL MASS;  Surgeon: Clyde Canterbury, MD;  Location: [facility] ORS;  Service: ENT;  Laterality: Bilateral;  ? THROAT SURGERY    ? Vocal cord polyps "burned"  ? TUBAL LIGATION    ? WISDOM TOOTH EXTRACTION    ? ? ?FAMILY HISTORY: family history includes Cancer (age of onset: 48) in her brother; Cancer (age of onset: 29) in her father; Coronary artery disease in her mother; Heart failure in her brother; Prostate cancer in her brother. ? ?SOCIAL HISTORY:  reports that she has been smoking cigarettes. She has a 51.00 pack-year smoking history. She has never used smokeless tobacco. She reports current alcohol use of about 1.0 standard drink per week. She reports that she does not use drugs. ? ?ALLERGIES: Codeine and Penicillins ? ?MEDICATIONS:  ?Current Outpatient Medications  ?Medication Sig Dispense Refill  ? acetaminophen (TYLENOL) 325 MG tablet Take 650 mg by mouth every 6 (six) hours as needed.    ? albuterol (VENTOLIN HFA) 108 (90 Base) MCG/ACT inhaler Inhale into the lungs every 6 (six) hours as needed for wheezing or shortness of breath.    ? amLODipine (NORVASC) 10 MG tablet Take 10 mg by mouth daily.    ? ASPIRIN 81 PO Take by mouth daily.    ? Cholecalciferol (VITAMIN D3 PO) Take by mouth daily.    ? famotidine (PEPCID) 10 MG tablet Take 20 mg by mouth daily.    ?  fluticasone (FLONASE) 50 MCG/ACT nasal spray Place into both nostrils daily.    ? HYDROcodone-acetaminophen (NORCO) 10-325 MG tablet Take 1 tablet by mouth every 6 (six) hours as needed.    ? lidocaine-prilocaine (EMLA) cream Apply on the port. 30 -45 min  prior to port access. 30 g 3  ? metoprolol succinate (TOPROL-XL) 100 MG 24 hr tablet Take 100 mg by mouth daily. Take with or  immediately following a meal.    ? montelukast (SINGULAIR) 10 MG tablet Take 10 mg by mouth daily.    ? ondansetron (ZOFRAN) 8 MG tablet One pill every 8 hours as needed for nausea/vomitting. 40 tablet 1  ? prochlorperazine (COMPAZINE) 10 MG tablet Take 1 tablet (10 mg total) by mouth every 6 (six) hours as needed for nausea or vomiting. 40 tablet 1  ? umeclidinium bromide (INCRUSE ELLIPTA) 62.5 MCG/ACT AEPB Inhale 1 puff into the lungs daily.    ? ?No current facility-administered medications for this encounter.  ? ? ?ECOG PERFORMANCE STATUS:  0 - Asymptomatic ? ?REVIEW OF SYSTEMS: ?Patient denies any weight loss, fatigue, weakness, fever, chills or night sweats. Patient denies any loss of vision, blurred vision. Patient denies any ringing  of the ears or hearing loss. No irregular heartbeat. Patient denies heart murmur or history of fainting. Patient denies any chest pain or pain radiating to her upper extremities. Patient denies any shortness of breath, difficulty breathing at night, cough or hemoptysis. Patient denies any swelling in the lower legs. Patient denies any nausea vomiting, vomiting of blood, or coffee ground material in the vomitus. Patient denies any stomach pain. Patient states has had normal bowel movements no significant constipation or diarrhea. Patient denies any dysuria, hematuria or significant nocturia. Patient denies any problems walking, swelling in the joints or loss of balance. Patient denies any skin changes, loss of hair or loss of weight. Patient denies any excessive worrying or anxiety or significant depression. Patient denies any problems with insomnia. Patient denies excessive thirst, polyuria, polydipsia. Patient denies any swollen glands, patient denies easy bruising or easy bleeding. Patient denies any recent infections, allergies or URI. Patient "s visual fields have not changed significantly in recent time. ?  ?PHYSICAL EXAM: ?BP 123/73 (BP Location: Left Arm, Patient  Position: Sitting, Cuff Size: Normal)   Pulse 73   Temp (!) 97.1 ?F (36.2 ?C) (Tympanic)   Resp 20   Wt 103 lb (46.7 kg)   BMI 23.09 kg/m?  ?Oral cavity is clear.  Neck is clear without evidence of cervical or supraclavicular adenopathy.  Well-developed well-nourished patient in NAD. HEENT reveals PERLA, EOMI, discs not visualized.  Oral cavity is clear. No oral mucosal lesions are identified. Neck is clear without evidence of cervical or supraclavicular adenopathy. Lungs are clear to A&P. Cardiac examination is essentially unremarkable with regular rate and rhythm without murmur rub or thrill. Abdomen is benign with no organomegaly or masses noted. Motor sensory and DTR levels are equal and symmetric in the upper and lower extremities. Cranial nerves II through XII are grossly intact. Proprioception is intact. No peripheral adenopathy or edema is identified. No motor or sensory levels are noted. Crude visual fields are within normal range. ? ?LABORATORY DATA: Pathology reports reviewed ? ?  ?RADIOLOGY RESULTS: CT scan and PET CT scan reviewed compatible with above-stated findings ? ? ?IMPRESSION: Stage II poorly differentiated carcinoma of the nasopharynx in 71 year old female ? ?PLAN: At this time we will recommend concurrent chemoradiation therapy.  I would treat to 71  Pearline Cables her nasopharynx including the area of PET positive disease.  I would also treat her hypermetabolic lymph nodes also to 70 Gray.  We will treat her remaining neck nodes to 54 Gray using IMRT dose painting technique.  Risks and benefits of treatment including loss of taste skin reaction fatigue all mucositis possible xerostomia all were reviewed in detail with the patient.  She seems to comprehend my treatment plan well.  I personally set up and ordered CT simulation for early next week we will use PET fusion study for treatment planning.  We will coordinate with medical oncology for chemotherapy.  There will be extra effort by both  professional staff as well as technical staff to coordinate and manage concurrent chemoradiation and ensuing side effects during her treatments.  Patient comprehends my recommendations well. ? ?I would like to take this opp

## 2021-08-14 ENCOUNTER — Ambulatory Visit: Payer: Medicare HMO | Admitting: Radiation Oncology

## 2021-08-14 ENCOUNTER — Encounter: Payer: Self-pay | Admitting: Radiology

## 2021-08-14 ENCOUNTER — Institutional Professional Consult (permissible substitution): Payer: Medicare HMO | Admitting: Radiation Oncology

## 2021-08-14 ENCOUNTER — Ambulatory Visit
Admission: RE | Admit: 2021-08-14 | Discharge: 2021-08-14 | Disposition: A | Discharge status: Home / Self Care | Payer: Medicare HMO | Source: Ambulatory Visit | Attending: Internal Medicine | Admitting: Internal Medicine

## 2021-08-14 ENCOUNTER — Other Ambulatory Visit: Payer: Medicare HMO

## 2021-08-14 DIAGNOSIS — R1310 Dysphagia, unspecified: Secondary | ICD-10-CM | POA: Diagnosis not present

## 2021-08-14 DIAGNOSIS — J449 Chronic obstructive pulmonary disease, unspecified: Secondary | ICD-10-CM | POA: Insufficient documentation

## 2021-08-14 DIAGNOSIS — C119 Malignant neoplasm of nasopharynx, unspecified: Secondary | ICD-10-CM | POA: Insufficient documentation

## 2021-08-14 DIAGNOSIS — I10 Essential (primary) hypertension: Secondary | ICD-10-CM | POA: Diagnosis not present

## 2021-08-14 DIAGNOSIS — F17210 Nicotine dependence, cigarettes, uncomplicated: Secondary | ICD-10-CM | POA: Diagnosis not present

## 2021-08-14 DIAGNOSIS — K219 Gastro-esophageal reflux disease without esophagitis: Secondary | ICD-10-CM | POA: Insufficient documentation

## 2021-08-14 HISTORY — PX: IR IMAGING GUIDED PORT INSERTION: IMG5740

## 2021-08-14 MED ORDER — SODIUM CHLORIDE 0.9 % IV SOLN
INTRAVENOUS | Status: DC
  Filled 2021-08-14: qty 1000

## 2021-08-14 MED ORDER — FENTANYL CITRATE (PF) 100 MCG/2ML IJ SOLN
INTRAMUSCULAR | Status: AC
  Filled 2021-08-14: qty 2

## 2021-08-14 MED ORDER — MIDAZOLAM HCL 2 MG/2ML IJ SOLN
INTRAMUSCULAR | Status: AC
  Filled 2021-08-14: qty 2

## 2021-08-14 MED ORDER — FENTANYL CITRATE (PF) 100 MCG/2ML IJ SOLN
INTRAMUSCULAR | Status: AC | PRN
  Administered 2021-08-14: 50 ug via INTRAVENOUS
  Administered 2021-08-14: 25 ug via INTRAVENOUS

## 2021-08-14 MED ORDER — HEPARIN SOD (PORK) LOCK FLUSH 100 UNIT/ML IV SOLN
INTRAVENOUS | Status: AC
  Administered 2021-08-14: 500 [IU]
  Filled 2021-08-14: qty 5

## 2021-08-14 MED ORDER — LIDOCAINE-EPINEPHRINE 1 %-1:100000 IJ SOLN
INTRAMUSCULAR | Status: AC
  Administered 2021-08-14: 11 mL
  Filled 2021-08-14: qty 1

## 2021-08-14 MED ORDER — MIDAZOLAM HCL 2 MG/2ML IJ SOLN
INTRAMUSCULAR | Status: AC | PRN
  Administered 2021-08-14: .5 mg via INTRAVENOUS
  Administered 2021-08-14: 1 mg via INTRAVENOUS

## 2021-08-14 NOTE — Progress Notes (Signed)
Tumor Board Documentation ? ?Vickie Caldwell was presented by Dr Richardson Landry and Dr Rogue Bussing at our Tumor Board on 08/14/2021, which included representatives from medical oncology, surgical, pulmonology, genetics, radiology, pathology, research, navigation, radiation oncology, internal medicine, palliative care. ? ?Vickie Caldwell currently presents as a new patient, for [place], for new positive pathology with history of the following treatments: surgical intervention(s), active survellience. ? ?Additionally, we reviewed previous medical and familial history, history of present illness, and recent lab results along with all available histopathologic and imaging studies. The tumor board considered available treatment options and made the following recommendations: ?Concurrent chemo-radiation therapy (Followed by Adjuvant chemo) ?  ? ?The following procedures/referrals were also placed: No orders of the defined types were placed in this encounter. ? ? ?Clinical Trial Status: not discussed  ? ?Staging used: Clinical Stage ?AJCC Staging: ?T: c2 ?N: c1 ?M: 0 ?Group: Stage II Squamous Cell Carcinoma Nasopharynx ? ? ?National site-specific guidelines NCCN were discussed with respect to the case. ? ?Tumor board is a meeting of clinicians from various specialty areas who evaluate and discuss patients for whom a multidisciplinary approach is being considered. Final determinations in the plan of care are those of the provider(s). The responsibility for follow up of recommendations given during tumor board is that of the provider.  ? ?Today?s extended care, comprehensive team conference, Vickie Caldwell was not present for the discussion and was not examined.  ? ?Multidisciplinary Tumor Board is a multidisciplinary case peer review process.  Decisions discussed in the Multidisciplinary Tumor Board reflect the opinions of the specialists present at the conference without having examined the patient.  Ultimately, treatment and diagnostic decisions  rest with the primary provider(s) and the patient. ? ?

## 2021-08-14 NOTE — Progress Notes (Signed)
Patient clinically stable post Port placement per Dr Dossie Der well with stable vitals pre and post procedure. Received Versed 1.5 mg along with Fentanyl 75 mcg IV for procedure. Report given to Levonne Spiller RN post procedure/specials. ?

## 2021-08-14 NOTE — Procedures (Signed)
Interventional Radiology Procedure Note ? ?Procedure: Port placement. ? ?Indication: Nasopharyngeal Ca ? ?Findings: Please refer to procedural dictation for full description. ? ?Complications: None ? ?EBL: < 10 mL ? ?Miachel Roux, MD ?[phone] ?  ?

## 2021-08-15 ENCOUNTER — Inpatient Hospital Stay: Payer: Medicare HMO

## 2021-08-19 ENCOUNTER — Ambulatory Visit
Admission: RE | Admit: 2021-08-19 | Discharge: 2021-08-19 | Disposition: A | Discharge status: Home / Self Care | Payer: Medicare HMO | Source: Ambulatory Visit | Attending: Radiation Oncology | Admitting: Radiation Oncology

## 2021-08-19 DIAGNOSIS — C119 Malignant neoplasm of nasopharynx, unspecified: Secondary | ICD-10-CM | POA: Insufficient documentation

## 2021-08-19 DIAGNOSIS — Z510 Encounter for antineoplastic radiation therapy: Secondary | ICD-10-CM | POA: Insufficient documentation

## 2021-08-25 MED FILL — Dexamethasone Sodium Phosphate Inj 100 MG/10ML: INTRAMUSCULAR | Qty: 1 | Status: AC

## 2021-08-25 MED FILL — Fosaprepitant Dimeglumine For IV Infusion 150 MG (Base Eq): INTRAVENOUS | Qty: 5 | Status: AC

## 2021-08-26 ENCOUNTER — Inpatient Hospital Stay: Payer: Medicare HMO

## 2021-08-26 ENCOUNTER — Inpatient Hospital Stay (HOSPITAL_BASED_OUTPATIENT_CLINIC_OR_DEPARTMENT_OTHER): Payer: Medicare HMO | Admitting: Internal Medicine

## 2021-08-26 ENCOUNTER — Encounter: Payer: Self-pay | Admitting: Internal Medicine

## 2021-08-26 ENCOUNTER — Other Ambulatory Visit: Payer: Self-pay

## 2021-08-26 VITALS — BP 131/88 | HR 86 | Temp 97.4°F | Resp 16

## 2021-08-26 DIAGNOSIS — C119 Malignant neoplasm of nasopharynx, unspecified: Secondary | ICD-10-CM | POA: Diagnosis not present

## 2021-08-26 DIAGNOSIS — E871 Hypo-osmolality and hyponatremia: Secondary | ICD-10-CM

## 2021-08-26 DIAGNOSIS — Z510 Encounter for antineoplastic radiation therapy: Secondary | ICD-10-CM | POA: Diagnosis not present

## 2021-08-26 DIAGNOSIS — Z5111 Encounter for antineoplastic chemotherapy: Secondary | ICD-10-CM | POA: Diagnosis not present

## 2021-08-26 LAB — COMPREHENSIVE METABOLIC PANEL
ALT: 16 U/L (ref 0–44)
AST: 21 U/L (ref 15–41)
Albumin: 3.8 g/dL (ref 3.5–5.0)
Alkaline Phosphatase: 86 U/L (ref 38–126)
Anion gap: 9 (ref 5–15)
BUN: 10 mg/dL (ref 8–23)
CO2: 25 mmol/L (ref 22–32)
Calcium: 9.2 mg/dL (ref 8.9–10.3)
Chloride: 91 mmol/L — ABNORMAL LOW (ref 98–111)
Creatinine, Ser: 0.51 mg/dL (ref 0.44–1.00)
GFR, Estimated: >60 mL/min (ref >60)
Glucose, Bld: 210 mg/dL — ABNORMAL HIGH (ref 70–99)
Potassium: 3.5 mmol/L (ref 3.5–5.1)
Sodium: 125 mmol/L — ABNORMAL LOW (ref 135–145)
Total Bilirubin: 0.4 mg/dL (ref 0.3–1.2)
Total Protein: 7.4 g/dL (ref 6.5–8.1)

## 2021-08-26 LAB — CBC WITH DIFFERENTIAL/PLATELET
Abs Immature Granulocytes: 0.06 10*3/uL (ref 0.00–0.07)
Basophils Absolute: 0 10*3/uL (ref 0.0–0.1)
Basophils Relative: 0 %
Eosinophils Absolute: 0 10*3/uL (ref 0.0–0.5)
Eosinophils Relative: 0 %
HCT: 38.2 % (ref 36.0–46.0)
Hemoglobin: 12.6 g/dL (ref 12.0–15.0)
Immature Granulocytes: 1 %
Lymphocytes Relative: 7 %
Lymphs Abs: 0.9 10*3/uL (ref 0.7–4.0)
MCH: 29.2 pg (ref 26.0–34.0)
MCHC: 33 g/dL (ref 30.0–36.0)
MCV: 88.6 fL (ref 80.0–100.0)
Monocytes Absolute: 0.8 10*3/uL (ref 0.1–1.0)
Monocytes Relative: 6 %
Neutro Abs: 11.3 10*3/uL — ABNORMAL HIGH (ref 1.7–7.7)
Neutrophils Relative %: 86 %
Platelets: 408 10*3/uL — ABNORMAL HIGH (ref 150–400)
RBC: 4.31 MIL/uL (ref 3.87–5.11)
RDW: 11.5 % (ref 11.5–15.5)
WBC: 13.1 10*3/uL — ABNORMAL HIGH (ref 4.0–10.5)
nRBC: 0 % (ref 0.0–0.2)

## 2021-08-26 LAB — MAGNESIUM: Magnesium: 1.8 mg/dL (ref 1.7–2.4)

## 2021-08-26 MED ORDER — POTASSIUM CHLORIDE IN NACL 20-0.9 MEQ/L-% IV SOLN
Freq: Once | INTRAVENOUS | Status: AC
  Filled 2021-08-26: qty 1000

## 2021-08-26 MED ORDER — SODIUM CHLORIDE 0.9 % IV SOLN
40.0000 mg/m2 | Freq: Once | INTRAVENOUS | Status: AC
  Administered 2021-08-26: 56 mg via INTRAVENOUS
  Filled 2021-08-26: qty 56

## 2021-08-26 MED ORDER — SODIUM CHLORIDE 0.9% FLUSH
10.0000 mL | INTRAVENOUS | Status: DC | PRN
  Administered 2021-08-26: 10 mL
  Filled 2021-08-26: qty 10

## 2021-08-26 MED ORDER — MAGNESIUM SULFATE 2 GM/50ML IV SOLN
2.0000 g | Freq: Once | INTRAVENOUS | Status: AC
  Administered 2021-08-26: 2 g via INTRAVENOUS
  Filled 2021-08-26: qty 50

## 2021-08-26 MED ORDER — SODIUM CHLORIDE 0.9 % IV SOLN
Freq: Once | INTRAVENOUS | Status: AC
  Filled 2021-08-26: qty 250

## 2021-08-26 MED ORDER — SODIUM CHLORIDE 0.9 % IV SOLN
10.0000 mg | Freq: Once | INTRAVENOUS | Status: AC
  Administered 2021-08-26: 10 mg via INTRAVENOUS
  Filled 2021-08-26: qty 10

## 2021-08-26 MED ORDER — HYDROCODONE-ACETAMINOPHEN 10-325 MG PO TABS: 1.0000 | ORAL_TABLET | Freq: Four times a day (QID) | ORAL | 0 refills | Status: DC | PRN

## 2021-08-26 MED ORDER — PALONOSETRON HCL INJECTION 0.25 MG/5ML
0.2500 mg | Freq: Once | INTRAVENOUS | Status: AC
  Administered 2021-08-26: 0.25 mg via INTRAVENOUS
  Filled 2021-08-26: qty 5

## 2021-08-26 MED ORDER — HEPARIN SOD (PORK) LOCK FLUSH 100 UNIT/ML IV SOLN
500.0000 [IU] | Freq: Once | INTRAVENOUS | Status: AC | PRN
  Administered 2021-08-26: 500 [IU]
  Filled 2021-08-26: qty 5

## 2021-08-26 MED ORDER — SODIUM CHLORIDE 0.9 % IV SOLN
150.0000 mg | Freq: Once | INTRAVENOUS | Status: AC
  Administered 2021-08-26: 150 mg via INTRAVENOUS
  Filled 2021-08-26: qty 150

## 2021-08-26 NOTE — Progress Notes (Signed)
Patient took Zofran at 7:30 this morning.  Appetite has decreased and does have a 3 lb wt gain since last documented wt.

## 2021-08-26 NOTE — Progress Notes (Signed)
[facility] NOTE  Patient Care Team: Revelo, Elyse Jarvis, MD as PCP - General (Family Medicine) Cammie Sickle, MD as Consulting Physician (Oncology)  CHIEF COMPLAINTS/PURPOSE OF CONSULTATION: head and neck cancer  #  Oncology History Overview Note  # April 26th, 2023-  NASOPHARYNGEAL MASS; ENDOSCOPIC BIOPSY:  - POORLY DIFFERENTIATED CARCINOMA, WITH SQUAMOUS DIFFERENTIATION AND  KERATINIZATION. EBV-NEGATIVE. [Dr.Bennett: ]  Comment:  Immunohistochemical studies show tumor cells to be strongly and  diffusely positive for CK5/6 and p40, and negative for S100, CD45 (LCA),  desmin, and p16  #  PET sacn May 5th, 2023-  Posterior nasopharyngeal mass maximum SUV 12.7, compatible with malignancy; Hypermetabolic right level IIb and right level IV lymph nodes compatible with malignant spread. 3. A 4 mm in short axis left level IIb lymph node has a maximum SUV of 2.1 which is about at the level of the blood pool, probably not involved. 4. Other imaging findings of potential clinical significance: Aortic Atherosclerosis (ICD10-I70.0). Coronary and systemic atherosclerosis. Emphysema (ICD10-J43.9). Sigmoid colon diverticulosis. Scoliosis.     Electronically Signed   By: Van Clines M.D.   On: 08/08/2021 10:56  # STAGE II nasopharyngeal carcinoma [Dr.Bennett];   # MAY 23rd- cisplatin weekly-RT; 5/25- RT   Nasopharyngeal carcinoma ([place])  08/11/2021 Initial Diagnosis   Nasopharyngeal carcinoma ([place])   08/11/2021 Cancer Staging   Staging form: Pharynx - Nasopharynx, AJCC 8th Edition - Clinical: Stage II (cT2, cN1, cM0) - Signed by Cammie Sickle, MD on 08/11/2021 Stage prefix: Initial diagnosis    08/26/2021 -  Chemotherapy   Patient is on Treatment Plan : HEAD/NECK Cisplatin q7d + XRT x 6 Cycles / Cisplatin D1 + 5FU IVCI D1-4 q28d x 3 Cycles         HISTORY OF PRESENTING ILLNESS: Alone.  Ambulating independently. Vickie Caldwell 71 y.o.   female newly diagnosed nasopharyngeal carcinoma is here to proceed with chemoradiation.   Patient interim underwent evaluation with radiation oncology.  Starting radiation on 5/25th.   In the interim patient also underwent a port placement.  Has an appointment with nephrology for chronic  hyponatremia in June.  Patient notes to have a lump in the right neck not significantly worse.  Review of Systems  Constitutional:  Positive for malaise/fatigue. Negative for chills, diaphoresis and fever.  HENT:  Negative for nosebleeds and sore throat.   Eyes:  Negative for double vision.  Respiratory:  Negative for cough, hemoptysis, sputum production, shortness of breath and wheezing.   Cardiovascular:  Negative for chest pain, palpitations, orthopnea and leg swelling.  Gastrointestinal:  Negative for abdominal pain, blood in stool, constipation, diarrhea, heartburn, melena, nausea and vomiting.  Genitourinary:  Negative for dysuria, frequency and urgency.  Musculoskeletal:  Positive for back pain and joint pain.  Skin: Negative.  Negative for itching and rash.  Neurological:  Positive for headaches. Negative for dizziness, tingling, focal weakness and weakness.  Endo/Heme/Allergies:  Does not bruise/bleed easily.  Psychiatric/Behavioral:  Negative for depression. The patient is not nervous/anxious and does not have insomnia.     MEDICAL HISTORY:  Past Medical History:  Diagnosis Date   Asthma    COPD (chronic obstructive pulmonary disease) (HCC)    Dysrhythmia    GERD (gastroesophageal reflux disease)    History of kidney stones    Hypertension    Osteoarthritis    TIA (transient ischemic attack) 2011   No Deficits   Vertigo     SURGICAL HISTORY: Past Surgical History:  Procedure Laterality Date   ABDOMINAL HYSTERECTOMY     BREAST BIOPSY Left 1980's   neg. Pt states punch biopsy at the nipple   IR IMAGING GUIDED PORT INSERTION  08/14/2021   LYMPH NODE DISSECTION Right    neck    MANDIBLE SURGERY     Sagittal split   NASAL ENDOSCOPY Bilateral 07/23/2021   Procedure: NASAL ENDOSCOPY WITH BIOPSY OF NASOPHARYNGEAL MASS;  Surgeon: Clyde Canterbury, MD;  Location: [facility] ORS;  Service: ENT;  Laterality: Bilateral;   THROAT SURGERY     Vocal cord polyps "burned"   TUBAL LIGATION     WISDOM TOOTH EXTRACTION      SOCIAL HISTORY: Social History   Socioeconomic History   Marital status: Single    Spouse name: Not on file   Number of children: Not on file   Years of education: Not on file   Highest education level: Not on file  Occupational History   Not on file  Tobacco Use   Smoking status: Every Day    Packs/day: 1.00    Years: 51.00    Pack years: 51.00    Types: Cigarettes   Smokeless tobacco: Never   Tobacco comments:    Started smoking around age 61  Vaping Use   Vaping Use: Never used  Substance and Sexual Activity   Alcohol use: Yes    Alcohol/week: 1.0 standard drink    Types: 1 Cans of beer per week    Comment: occasional   Drug use: Never   Sexual activity: Not Currently    Birth control/protection: None  Other Topics Concern   Not on file  Social History Narrative   Smoker; sometimes beer/wine; used to work for Therapist, 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 Lives in [place]. With daughter-ZES//brother. From [place].    Social Determinants of Health   Financial Resource Strain: Not on file  Food Insecurity: Not on file  Transportation Needs: Not on file  Physical Activity: Not on file  Stress: Not on file  Social Connections: Not on file  Intimate Partner Violence: Not on file    FAMILY HISTORY: Family History  Problem Relation Age of Onset   Coronary artery disease Mother    Cancer Father 28       Oral   Cancer Brother 84       testicular   Prostate cancer Brother    Heart failure Brother    Breast cancer Neg Hx     ALLERGIES:  is allergic to codeine and penicillins.  MEDICATIONS:  Current Outpatient Medications  Medication Sig  Dispense Refill   acetaminophen (TYLENOL) 325 MG tablet Take 650 mg by mouth every 6 (six) hours as needed.     albuterol (VENTOLIN HFA) 108 (90 Base) MCG/ACT inhaler Inhale into the lungs every 6 (six) hours as needed for wheezing or shortness of breath.     amLODipine (NORVASC) 10 MG tablet Take 10 mg by mouth daily.     ASPIRIN 81 PO Take by mouth daily.     Cholecalciferol (VITAMIN D3 PO) Take by mouth daily.     famotidine (PEPCID) 10 MG tablet Take 20 mg by mouth daily.     fluticasone (FLONASE) 50 MCG/ACT nasal spray Place into both nostrils daily.     HYDROcodone-acetaminophen (NORCO) 10-325 MG tablet Take 1 tablet by mouth every 6 (six) hours as needed.     lidocaine-prilocaine (EMLA) cream Apply on the port. 30 -45 min  prior to port access. 30 g 3  metoprolol succinate (TOPROL-XL) 100 MG 24 hr tablet Take 100 mg by mouth daily. Take with or immediately following a meal.     montelukast (SINGULAIR) 10 MG tablet Take 10 mg by mouth daily.     ondansetron (ZOFRAN) 8 MG tablet One pill every 8 hours as needed for nausea/vomitting. 40 tablet 1   prochlorperazine (COMPAZINE) 10 MG tablet Take 1 tablet (10 mg total) by mouth every 6 (six) hours as needed for nausea or vomiting. 40 tablet 1   umeclidinium bromide (INCRUSE ELLIPTA) 62.5 MCG/ACT AEPB Inhale 1 puff into the lungs daily.     No current facility-administered medications for this visit.   Facility-Administered Medications Ordered in Other Visits  Medication Dose Route Frequency Provider Last Rate Last Admin   CISplatin (PLATINOL) 56 mg in sodium chloride 0.9 % 250 mL chemo infusion  40 mg/m2 (Treatment Plan Recorded) Intravenous Once Charlaine Dalton R, MD       heparin lock flush 100 unit/mL  500 Units Intracatheter Once PRN Cammie Sickle, MD       sodium chloride flush (NS) 0.9 % injection 10 mL  10 mL Intracatheter PRN Cammie Sickle, MD         PHYSICAL EXAMINATION: ECOG PERFORMANCE STATUS: 1 -  Symptomatic but completely ambulatory  Vitals:   08/26/21 0822  BP: 125/79  Pulse: 79  Resp: 18  Temp: (!) 97.2 F (36.2 C)   Filed Weights   08/26/21 0822  Weight: 106 lb 6.4 oz (48.3 kg)   Approximately 2-3 cm lymph node noted in the right submandibular region.  Physical Exam Vitals and nursing note reviewed.  HENT:     Head: Normocephalic and atraumatic.     Mouth/Throat:     Pharynx: Oropharynx is clear.  Eyes:     Extraocular Movements: Extraocular movements intact.     Pupils: Pupils are equal, round, and reactive to light.  Cardiovascular:     Rate and Rhythm: Normal rate and regular rhythm.  Pulmonary:     Comments: Decreased breath sounds bilaterally.  Abdominal:     Palpations: Abdomen is soft.  Musculoskeletal:        General: Normal range of motion.     Cervical back: Normal range of motion.  Skin:    General: Skin is warm.  Neurological:     General: No focal deficit present.     Mental Status: She is alert and oriented to person, place, and time.  Psychiatric:        Behavior: Behavior normal.        Judgment: Judgment normal.     LABORATORY DATA:  I have reviewed the data as listed Lab Results  Component Value Date   WBC 13.1 (H) 08/26/2021   HGB 12.6 08/26/2021   HCT 38.2 08/26/2021   MCV 88.6 08/26/2021   PLT 408 (H) 08/26/2021   Recent Labs    07/18/21 1042 07/23/21 0724 08/11/21 1238 08/26/21 0832  NA 126* 126* 130* 125*  K 3.6  --  3.6 3.5  CL 91*  --  96* 91*  CO2 25  --  28 25  GLUCOSE 135*  --  106* 210*  BUN 7*  --  6* 10  CREATININE 0.41*  --  0.56 0.51  CALCIUM 9.4  --  9.0 9.2  GFRNONAA >60  --  >60 >60  PROT  --   --  7.7 7.4  ALBUMIN  --   --  4.1 3.8  AST  --   --  22 21  ALT  --   --  19 16  ALKPHOS  --   --  88 86  BILITOT  --   --  0.6 0.4    RADIOGRAPHIC STUDIES: I have personally reviewed the radiological images as listed and agreed with the findings in the report. NM PET Image Initial (PI) Skull Base  To Thigh (F-18 FDG)  Result Date: 08/08/2021 CLINICAL DATA:  Initial treatment strategy for poorly differentiated nasopharyngeal carcinoma with squamous differentiation and keratinization. EXAM: NUCLEAR MEDICINE PET SKULL BASE TO THIGH TECHNIQUE: 6.0 mCi F-18 FDG was injected intravenously. Full-ring PET imaging was performed from the skull base to thigh after the radiotracer. CT data was obtained and used for attenuation correction and anatomic localization. Fasting blood glucose: 114 mg/dl COMPARISON:  Chest CT 03/12/2020 FINDINGS: Mediastinal blood pool activity: SUV max 2.0 Liver activity: SUV max NA NECK: Abnormal accentuated activity along the posterior nasopharynx along the mucosal pharyngeal space, with right greater than left extension potentially along the right prevertebral space, with maximum SUV 12.7. Several clustered right level IIb lymph nodes are hypermetabolic, the largest of these is a 1.0 cm lymph node on image 42 of series 2 with maximum SUV 6.5 A left level IIb lymph node on image 42 of series 2 has a short axis diameter of 0.4 cm and a maximum SUV of 2.1 which is near blood pool. A right level IV lymph node measuring 0.9 cm in short axis on image 60 of series 2 has a maximum SUV of 9.8. Just above the thoracic inlet along the posterior left thyroid lobe, a 1.4 by 0.9 cm hypodense lesion is present on image 63 of series 2, maximum SUV 1.9 which is not greater than the rest of the thyroid gland. This finding was also present on the prior chest CT of 09/23/2017 and is probably a small thyroid nodule rather than a lymph node. Not clinically significant; no follow-up imaging recommended (ref: J Am Coll Radiol. 2015 Feb;12(2): 143-50). Incidental CT findings: Bilateral common carotid atherosclerotic calcification. CHEST: No significant abnormal hypermetabolic activity in this region. Incidental CT findings: Coronary, aortic arch, and branch vessel atherosclerotic vascular disease. Centrilobular  emphysema. ABDOMEN/PELVIS: No significant abnormal hypermetabolic activity in this region. Incidental CT findings: Atherosclerosis is present, including aortoiliac atherosclerotic disease. Sigmoid colon diverticulosis. SKELETON: No significant abnormal hypermetabolic activity in this region. Incidental CT findings: Thoracolumbar scoliosis. Old healed right posterior rib fractures. IMPRESSION: 1. Posterior nasopharyngeal mass maximum SUV 12.7, compatible with malignancy. 2. Hypermetabolic right level IIb and right level IV lymph nodes compatible with malignant spread. 3. A 4 mm in short axis left level IIb lymph node has a maximum SUV of 2.1 which is about at the level of the blood pool, probably not involved. 4. Other imaging findings of potential clinical significance: Aortic Atherosclerosis (ICD10-I70.0). Coronary and systemic atherosclerosis. Emphysema (ICD10-J43.9). Sigmoid colon diverticulosis. Scoliosis. Electronically Signed   By: Van Clines M.D.   On: 08/08/2021 10:56   IR IMAGING GUIDED PORT INSERTION  Result Date: 08/14/2021 INDICATION: Nasopharyngeal carcinoma EXAM: IMPLANTED PORT A CATH PLACEMENT WITH ULTRASOUND AND FLUOROSCOPIC GUIDANCE MEDICATIONS: None ANESTHESIA/SEDATION: Moderate (conscious) sedation was employed during this procedure. A total of Versed 1.5 mg and Fentanyl 75 mcg was administered intravenously by the radiology nurse. Total intra-service moderate Sedation Time: 16 minutes. The patient's level of consciousness and vital signs were monitored continuously by radiology nursing throughout the procedure under my direct supervision. FLUOROSCOPY: Radiation Exposure Index (as provided by the fluoroscopic device):  2.1 mGy Kerma COMPLICATIONS: None immediate. PROCEDURE: The procedure, risks, benefits, and alternatives were explained to the patient. Questions regarding the procedure were encouraged and answered. The patient understands and consents to the procedure. A timeout was  performed prior to the initiation of the procedure. Patient positioned supine on the angiography table. Right neck and anterior upper chest prepped and draped in the usual sterile fashion. All elements of maximal sterile barrier were utilized including, cap, mask, sterile gown, sterile gloves, large sterile drape, hand scrubbing and 2% Chlorhexidine for skin cleaning. The right internal jugular vein was evaluated with ultrasound and shown to be patent. A permanent ultrasound image was obtained and placed in the patient's medical record. Local anesthesia was provided with 1% lidocaine with epinephrine. Using sterile gel and a sterile probe cover, the right internal jugular vein was entered with a 21 ga needle during real time ultrasound guidance. 0.018 inch guidewire placed and 21 ga needle exchanged for transitional dilator set. Utilizing fluoroscopy, 0.035 inch guidewire advanced through the needle without difficulty. Attention then turned to the right anterior upper chest. Following local lidocaine administration, a port pocket was created. The catheter was connected to the port and brought from the pocket to the venotomy site through a subcutaneous tunnel. The catheter was cut to size and inserted through the peel-away sheath. The catheter tip was positioned at the cavoatrial junction using fluoroscopic guidance. The port aspirated and flushed well. The port pocket was closed with deep and superficial absorbable suture. The port pocket incision and venotomy sites were also sealed with Dermabond. IMPRESSION: Successful placement of a right internal jugular approach power injectable Port-A-Cath. The catheter is ready for immediate use. Electronically Signed   By: Miachel Roux M.D.   On: 08/14/2021 14:05    ASSESSMENT & PLAN:   Nasopharyngeal carcinoma [facility]) # April 26th, 2023-  NASOPHARYNGEAL SQUAMOUS EBV-NEGATIVE. [Dr.Bennett: ]; STAGE II [T2 N1]; #  PET scan May 5th, 2023-  Posterior nasopharyngeal mass  maximum SUV 12.7, compatible with malignancy; Hypermetabolic right level IIb and right level IV lymph nodes compatible with malignant spread. Discussed at the tumor conference.  Discussed with Dr. Katina Dung. Donella Stade.  On definitive chemoradiation cisplatin followed by  from adjuvant chemotherapy given the nasopharyngeal disease.  Discussed that the goal of treatment is cure.  Patient on chemoradiation -concurrent radiation-  [5/25 to july14th*].  #Proceed with cycle 1 of cisplatin today. Labs today reviewed;  acceptable for treatment today.   #Risk of weight loss / difficulty swallowing-counseled the patient regarding potential weight loss; moderate to severe mucositis from chemoradiation.  Awaiting meeting with [facility].  # Smoking: 1ppd/day; in process of quitting smoking.   # Chronic Hyponatremia: ? 130- -check labs. ?  As per patient 1 functioning kidney; awaiting evaluation with nephrology.  # IV access/Mediport-functional.   # DISPOSITION: # chemo today # follow up in 1 week- NP; labs- cbc/cmp;mag; cisplatin # follow up in 2 weeks- MD; labs- cbc/cmp; mag; Csiplatin weekly- Dr.B  # 40 minutes face-to-face with the patient discussing the above plan of care; more than 50% of time spent on prognosis/ natural history; counseling and coordination.      All questions were answered. The patient knows to call the clinic with any problems, questions or concerns.   Cammie Sickle, MD 08/26/2021 1:18 PM

## 2021-08-26 NOTE — Assessment & Plan Note (Addendum)
#   April 26th, 2023-  NASOPHARYNGEAL SQUAMOUS EBV-NEGATIVE. [Dr.Bennett: ]; STAGE II [T2 N1]; #  PET scan May 5th, 2023-  Posterior nasopharyngeal mass maximum SUV 12.7, compatible with malignancy; Hypermetabolic right level IIb and right level IV lymph nodes compatible with malignant spread. Discussed at the tumor conference.  Discussed with Dr. Katina Dung. Donella Stade.  On definitive chemoradiation cisplatin followed by  from adjuvant chemotherapy given the nasopharyngeal disease.  Discussed that the goal of treatment is cure.  Patient on chemoradiation -concurrent radiation-  [5/25 to july14th*].  #Proceed with cycle 1 of cisplatin today. Labs today reviewed;  acceptable for treatment today.   #Risk of weight loss / difficulty swallowing-counseled the patient regarding potential weight loss; moderate to severe mucositis from chemoradiation.  Awaiting meeting with [facility].  # Smoking: 1ppd/day; in process of quitting smoking.   # Chronic Hyponatremia: ? 130- -check labs. ?  As per patient 1 functioning kidney; awaiting evaluation with nephrology.  # IV access/Mediport-functional.   # DISPOSITION: # chemo today # follow up in 1 week- NP; labs- cbc/cmp;mag; cisplatin # follow up in 2 weeks- MD; labs- cbc/cmp; mag; Csiplatin weekly- Dr.B  # 40 minutes face-to-face with the patient discussing the above plan of care; more than 50% of time spent on prognosis/ natural history; counseling and coordination.

## 2021-08-26 NOTE — Patient Instructions (Signed)
[facility] CANCER CTR AT [place]  Discharge Instructions: Thank you for choosing [facility] to provide your oncology and hematology care.  If you have a lab appointment with the [facility], please go directly to the [facility] and check in at the registration area.  Wear comfortable clothing and clothing appropriate for easy access to any Portacath or PICC line.   We strive to give you quality time with your provider. You may need to reschedule your appointment if you arrive late (15 or more minutes).  Arriving late affects you and other patients whose appointments are after yours.  Also, if you miss three or more appointments without notifying the office, you may be dismissed from the clinic at the provider's discretion.     Cisplatin injection What is this medication? CISPLATIN (SIS pla tin) is a chemotherapy drug. It targets fast dividing cells, like cancer cells, and causes these cells to die. This medicine is used to treat many types of cancer like bladder, ovarian, and testicular cancers. This medicine may be used for other purposes; ask your health care provider or pharmacist if you have questions. COMMON BRAND NAME(S): Platinol, Platinol -AQ What should I tell my care team before I take this medication? They need to know if you have any of these conditions: eye disease, vision problems hearing problems kidney disease low blood counts, like white cells, platelets, or red blood cells tingling of the fingers or toes, or other nerve disorder an unusual or allergic reaction to cisplatin, carboplatin, oxaliplatin, other medicines, foods, dyes, or preservatives pregnant or trying to get pregnant breast-feeding How should I use this medication? This drug is given as an infusion into a vein. It is administered in a hospital or clinic by a specially trained health care professional. Talk to your pediatrician regarding the use of this medicine in children.  Special care may be needed. Overdosage: If you think you have taken too much of this medicine contact a poison control center or emergency room at once. NOTE: This medicine is only for you. Do not share this medicine with others. What if I miss a dose? It is important not to miss a dose. Call your doctor or health care professional if you are unable to keep an appointment. What may interact with this medication? This medicine may interact with the following medications: foscarnet certain antibiotics like amikacin, gentamicin, neomycin, polymyxin B, streptomycin, tobramycin, vancomycin This list may not describe all possible interactions. Give your health care provider a list of all the medicines, herbs, non-prescription drugs, or dietary supplements you use. Also tell them if you smoke, drink alcohol, or use illegal drugs. Some items may interact with your medicine. What should I watch for while using this medication? Your condition will be monitored carefully while you are receiving this medicine. You will need important blood work done while you are taking this medicine. This drug may make you feel generally unwell. This is not uncommon, as chemotherapy can affect healthy cells as well as cancer cells. Report any side effects. Continue your course of treatment even though you feel ill unless your doctor tells you to stop. This medicine may increase your risk of getting an infection. Call your healthcare professional for advice if you get a fever, chills, or sore throat, or other symptoms of a cold or flu. Do not treat yourself. Try to avoid being around people who are sick. Avoid taking medicines that contain aspirin, acetaminophen, ibuprofen, naproxen, or ketoprofen unless instructed by  your healthcare professional. These medicines may hide a fever. This medicine may increase your risk to bruise or bleed. Call your doctor or health care professional if you notice any unusual bleeding. Be careful  brushing and flossing your teeth or using a toothpick because you may get an infection or bleed more easily. If you have any dental work done, tell your dentist you are receiving this medicine. Do not become pregnant while taking this medicine or for 14 months after stopping it. Women should inform their healthcare professional if they wish to become pregnant or think they might be pregnant. Men should not father a child while taking this medicine and for 11 months after stopping it. There is potential for serious side effects to an unborn child. Talk to your healthcare professional for more information. Do not breast-feed an infant while taking this medicine. This medicine has caused ovarian failure in some women. This medicine may make it more difficult to get pregnant. Talk to your healthcare professional if you are concerned about your fertility. This medicine has caused decreased sperm counts in some men. This may make it more difficult to father a child. Talk to your healthcare professional if you are concerned about your fertility. Drink fluids as directed while you are taking this medicine. This will help protect your kidneys. Call your doctor or health care professional if you get diarrhea. Do not treat yourself. What side effects may I notice from receiving this medication? Side effects that you should report to your doctor or health care professional as soon as possible: allergic reactions like skin rash, itching or hives, swelling of the face, lips, or tongue blurred vision changes in vision decreased hearing or ringing of the ears nausea, vomiting pain, redness, or irritation at site where injected pain, tingling, numbness in the hands or feet signs and symptoms of bleeding such as bloody or black, tarry stools; red or dark brown urine; spitting up blood or brown material that looks like coffee grounds; red spots on the skin; unusual bruising or bleeding from the eyes, gums, or  nose signs and symptoms of infection like fever; chills; cough; sore throat; pain or trouble passing urine signs and symptoms of kidney injury like trouble passing urine or change in the amount of urine signs and symptoms of low red blood cells or anemia such as unusually weak or tired; feeling faint or lightheaded; falls; breathing problems Side effects that usually do not require medical attention (report to your doctor or health care professional if they continue or are bothersome): loss of appetite mouth sores muscle cramps This list may not describe all possible side effects. Call your doctor for medical advice about side effects. You may report side effects to FDA at 1-800-FDA-1088. Where should I keep my medication? This drug is given in a hospital or clinic and will not be stored at home. NOTE: This sheet is a summary. It may not cover all possible information. If you have questions about this medicine, talk to your doctor, pharmacist, or health care provider.  2023 Elsevier/Gold Standard (2021-02-21 00:00:00)  For prescription refill requests, have your pharmacy contact our office and allow 72 hours for refills to be completed.    Today you received the following chemotherapy and/or immunotherapy agents: Cisplatin      To help prevent nausea and vomiting after your treatment, we encourage you to take your nausea medication as directed.  BELOW ARE SYMPTOMS THAT SHOULD BE REPORTED IMMEDIATELY: *FEVER GREATER THAN 100.4 F (38 C)  OR HIGHER *CHILLS OR SWEATING *NAUSEA AND VOMITING THAT IS NOT CONTROLLED WITH YOUR NAUSEA MEDICATION *UNUSUAL SHORTNESS OF BREATH *UNUSUAL BRUISING OR BLEEDING *URINARY PROBLEMS (pain or burning when urinating, or frequent urination) *BOWEL PROBLEMS (unusual diarrhea, constipation, pain near the anus) TENDERNESS IN MOUTH AND THROAT WITH OR WITHOUT PRESENCE OF ULCERS (sore throat, sores in mouth, or a toothache) UNUSUAL RASH, SWELLING OR PAIN  UNUSUAL  VAGINAL DISCHARGE OR ITCHING   Items with * indicate a potential emergency and should be followed up as soon as possible or go to the Emergency Department if any problems should occur.  Please show the CHEMOTHERAPY ALERT CARD or IMMUNOTHERAPY ALERT CARD at check-in to the Emergency Department and triage nurse.  Should you have questions after your visit or need to cancel or reschedule your appointment, please contact [facility] CANCER [place] AT [place]  [phone] and follow the prompts.  Office hours are 8:00 a.m. to 4:30 p.m. Monday - Friday. Please note that voicemails left after 4:00 p.m. may not be returned until the following business day.  We are closed weekends and major holidays. You have access to a nurse at all times for urgent questions. Please call the main number to the clinic [phone] and follow the prompts.  For any non-urgent questions, you may also contact your provider using MyChart. We now offer e-Visits for anyone 76 and older to request care online for non-urgent symptoms. For details visit [email or web address].   Also download the MyChart app! Go to the app store, search "MyChart", open the app, select [facility], and log in with your MyChart username and password.  Due to Covid, a mask is required upon entering the hospital/clinic. If you do not have a mask, one will be given to you upon arrival. For doctor visits, patients may have 1 support person aged 59 or older with them. For treatment visits, patients cannot have anyone with them due to current Covid guidelines and our immunocompromised population.

## 2021-08-26 NOTE — Progress Notes (Addendum)
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  Past medical history of COPD, HTN, OA, GERD, TIA, one kidney.  Patient receiving concurrent chemotherapy and radiation.   Met with patient during first infusion.  Patient reports that she has a good appetite.  Breakfast is usually whole grain cereal with juice and water.  Lunch is egg salad, tuna salad sandwich or soup or leftovers.  Evening meal is meat, starch and fruit (cooked) and vegetable.  Patient mostly eats meats in the winter time than in the summer months.  Cooks meals at home.  Denies trouble chewing.  Says that she is not eliminating any foods in diet because of difficulty swallowing.  Has drank ensure and carnation instant breakfast before.      Nutrition Focused Physical Exam:   Orbital Region: normal  Buccal Region: normal Upper Arm Region: mild Thoracic and Lumbar Region: normal Temple Region: mild Clavicle Bone Region: normal Shoulder and Acromion Bone Region: normal Scapular Bone Region: unable to evaluate Dorsal Hand: mild Patellar Region: mild Anterior Thigh Region: moderate Posterior Calf Region: normal Edema (RD assessment): none Hair: reviewed Eyes: reviewed Mouth: reviewed Skin: reviewed Nails: reviewed   Medications: Vit D, pepcid, zofran, compazine   Labs: glucose 210, Na 125   Anthropometrics:   Height: 4'8" Weight: 106 lb 6.4 oz  UBW: 104lb-110 lb per patient BMI: 23  Stable weight  Estimated Energy Needs  Kcals: 1440-1680 Protein: 72-84 g Fluid: 1440-1633m   NUTRITION DIAGNOSIS: Predicted sub optimal energy intake related to cancer and cancer related treatment as evidenced by side effects to effect nutrition   INTERVENTION:  Discussed importance of nutrition during treatment. Discussed ways to add calories and protein in diet.  Handout on High Calorie, High protein diet provided Provided  samples of ensure complete, boost VHC, orgain, [employer] 1.4 Sample of glutamine powder given as well and instructed patient to use BID (glutasolve).   Contact information provided   MONITORING, EVALUATION, GOAL:  Weight trends, intake  Next Visit: Tuesday, June 6 after radiation  Kayvan Hoefling B. AZenia Resides [place] LAshtonRegistered Dietitian [phone]

## 2021-08-27 ENCOUNTER — Ambulatory Visit: Admission: RE | Admit: 2021-08-27 | Payer: Medicare HMO | Source: Ambulatory Visit

## 2021-08-27 ENCOUNTER — Telehealth: Payer: Self-pay

## 2021-08-27 NOTE — Telephone Encounter (Signed)
Telephone call to patient for follow up after receiving first infusion.   Patient states infusion went great.  States eating good and drinking plenty of fluids.   Denies any nausea or vomiting.  Encouraged patient to call for any concerns or questions. 

## 2021-08-28 ENCOUNTER — Other Ambulatory Visit: Payer: Self-pay

## 2021-08-28 ENCOUNTER — Ambulatory Visit
Admission: RE | Admit: 2021-08-28 | Discharge: 2021-08-28 | Disposition: A | Discharge status: Home / Self Care | Payer: Medicare HMO | Source: Ambulatory Visit | Attending: Radiation Oncology | Admitting: Radiation Oncology

## 2021-08-28 ENCOUNTER — Telehealth: Payer: Self-pay

## 2021-08-28 DIAGNOSIS — Z510 Encounter for antineoplastic radiation therapy: Secondary | ICD-10-CM | POA: Diagnosis not present

## 2021-08-28 LAB — RAD ONC ARIA SESSION SUMMARY
Course Elapsed Days: 0
Plan Fractions Treated to Date: 1
Plan Prescribed Dose Per Fraction: 2 Gy
Plan Total Fractions Prescribed: 35
Plan Total Prescribed Dose: 70 Gy
Reference Point Dosage Given to Date: 2 Gy
Reference Point Session Dosage Given: 2 Gy
Session Number: 1

## 2021-08-28 NOTE — Telephone Encounter (Signed)
Spoke with Vickie Caldwell at [facility], pt has been sch'd for 09/18/21 at 1pm. Re: referral for hyponatremia.  Pt aware.

## 2021-08-29 ENCOUNTER — Other Ambulatory Visit: Payer: Self-pay

## 2021-08-29 ENCOUNTER — Ambulatory Visit
Admission: RE | Admit: 2021-08-29 | Discharge: 2021-08-29 | Disposition: A | Discharge status: Home / Self Care | Payer: Medicare HMO | Source: Ambulatory Visit | Attending: Radiation Oncology | Admitting: Radiation Oncology

## 2021-08-29 DIAGNOSIS — Z510 Encounter for antineoplastic radiation therapy: Secondary | ICD-10-CM | POA: Diagnosis not present

## 2021-08-29 LAB — RAD ONC ARIA SESSION SUMMARY
Course Elapsed Days: 1
Plan Fractions Treated to Date: 2
Plan Prescribed Dose Per Fraction: 2 Gy
Plan Total Fractions Prescribed: 35
Plan Total Prescribed Dose: 70 Gy
Reference Point Dosage Given to Date: 4 Gy
Reference Point Session Dosage Given: 2 Gy
Session Number: 2

## 2021-09-02 ENCOUNTER — Other Ambulatory Visit: Payer: Self-pay

## 2021-09-02 ENCOUNTER — Ambulatory Visit
Admission: RE | Admit: 2021-09-02 | Discharge: 2021-09-02 | Disposition: A | Discharge status: Home / Self Care | Payer: Medicare HMO | Source: Ambulatory Visit | Attending: Radiation Oncology | Admitting: Radiation Oncology

## 2021-09-02 DIAGNOSIS — Z510 Encounter for antineoplastic radiation therapy: Secondary | ICD-10-CM | POA: Diagnosis not present

## 2021-09-02 LAB — RAD ONC ARIA SESSION SUMMARY
Course Elapsed Days: 5
Plan Fractions Treated to Date: 3
Plan Prescribed Dose Per Fraction: 2 Gy
Plan Total Fractions Prescribed: 35
Plan Total Prescribed Dose: 70 Gy
Reference Point Dosage Given to Date: 6 Gy
Reference Point Session Dosage Given: 2 Gy
Session Number: 3

## 2021-09-03 ENCOUNTER — Other Ambulatory Visit: Payer: Self-pay

## 2021-09-03 ENCOUNTER — Ambulatory Visit
Admission: RE | Admit: 2021-09-03 | Discharge: 2021-09-03 | Disposition: A | Discharge status: Home / Self Care | Payer: Medicare HMO | Source: Ambulatory Visit | Attending: Radiation Oncology | Admitting: Radiation Oncology

## 2021-09-03 DIAGNOSIS — Z510 Encounter for antineoplastic radiation therapy: Secondary | ICD-10-CM | POA: Diagnosis not present

## 2021-09-03 LAB — RAD ONC ARIA SESSION SUMMARY
Course Elapsed Days: 6
Plan Fractions Treated to Date: 4
Plan Prescribed Dose Per Fraction: 2 Gy
Plan Total Fractions Prescribed: 35
Plan Total Prescribed Dose: 70 Gy
Reference Point Dosage Given to Date: 8 Gy
Reference Point Session Dosage Given: 2 Gy
Session Number: 4

## 2021-09-04 ENCOUNTER — Ambulatory Visit: Payer: Medicare HMO

## 2021-09-04 ENCOUNTER — Ambulatory Visit: Admission: RE | Admit: 2021-09-04 | Payer: Medicare HMO | Source: Ambulatory Visit

## 2021-09-04 MED FILL — Dexamethasone Sodium Phosphate Inj 100 MG/10ML: INTRAMUSCULAR | Qty: 1 | Status: AC

## 2021-09-04 MED FILL — Fosaprepitant Dimeglumine For IV Infusion 150 MG (Base Eq): INTRAVENOUS | Qty: 5 | Status: AC

## 2021-09-05 ENCOUNTER — Ambulatory Visit
Admission: RE | Admit: 2021-09-05 | Discharge: 2021-09-05 | Disposition: A | Discharge status: Home / Self Care | Payer: Medicare HMO | Source: Ambulatory Visit | Attending: Radiation Oncology | Admitting: Radiation Oncology

## 2021-09-05 ENCOUNTER — Encounter: Payer: Self-pay | Admitting: Nurse Practitioner

## 2021-09-05 ENCOUNTER — Inpatient Hospital Stay: Payer: Medicare HMO | Attending: Internal Medicine

## 2021-09-05 ENCOUNTER — Inpatient Hospital Stay: Payer: Medicare HMO

## 2021-09-05 ENCOUNTER — Other Ambulatory Visit: Payer: Self-pay

## 2021-09-05 ENCOUNTER — Inpatient Hospital Stay (HOSPITAL_BASED_OUTPATIENT_CLINIC_OR_DEPARTMENT_OTHER): Payer: Medicare HMO | Admitting: Nurse Practitioner

## 2021-09-05 VITALS — BP 122/72 | HR 86 | Temp 96.8°F | Resp 16 | Ht <= 58 in | Wt 104.5 lb

## 2021-09-05 DIAGNOSIS — C119 Malignant neoplasm of nasopharynx, unspecified: Secondary | ICD-10-CM

## 2021-09-05 DIAGNOSIS — E871 Hypo-osmolality and hyponatremia: Secondary | ICD-10-CM | POA: Diagnosis not present

## 2021-09-05 DIAGNOSIS — E878 Other disorders of electrolyte and fluid balance, not elsewhere classified: Secondary | ICD-10-CM | POA: Diagnosis not present

## 2021-09-05 DIAGNOSIS — R519 Headache, unspecified: Secondary | ICD-10-CM | POA: Diagnosis not present

## 2021-09-05 DIAGNOSIS — Z5111 Encounter for antineoplastic chemotherapy: Secondary | ICD-10-CM | POA: Insufficient documentation

## 2021-09-05 DIAGNOSIS — F17210 Nicotine dependence, cigarettes, uncomplicated: Secondary | ICD-10-CM | POA: Diagnosis not present

## 2021-09-05 DIAGNOSIS — Z79899 Other long term (current) drug therapy: Secondary | ICD-10-CM | POA: Insufficient documentation

## 2021-09-05 DIAGNOSIS — R221 Localized swelling, mass and lump, neck: Secondary | ICD-10-CM | POA: Insufficient documentation

## 2021-09-05 DIAGNOSIS — Z510 Encounter for antineoplastic radiation therapy: Secondary | ICD-10-CM | POA: Diagnosis present

## 2021-09-05 DIAGNOSIS — E876 Hypokalemia: Secondary | ICD-10-CM | POA: Diagnosis not present

## 2021-09-05 LAB — COMPREHENSIVE METABOLIC PANEL
ALT: 19 U/L (ref 0–44)
AST: 16 U/L (ref 15–41)
Albumin: 3.4 g/dL — ABNORMAL LOW (ref 3.5–5.0)
Alkaline Phosphatase: 100 U/L (ref 38–126)
Anion gap: 9 (ref 5–15)
BUN: 7 mg/dL — ABNORMAL LOW (ref 8–23)
CO2: 26 mmol/L (ref 22–32)
Calcium: 9 mg/dL (ref 8.9–10.3)
Chloride: 88 mmol/L — ABNORMAL LOW (ref 98–111)
Creatinine, Ser: 0.41 mg/dL — ABNORMAL LOW (ref 0.44–1.00)
GFR, Estimated: >60 mL/min (ref >60)
Glucose, Bld: 138 mg/dL — ABNORMAL HIGH (ref 70–99)
Potassium: 4.1 mmol/L (ref 3.5–5.1)
Sodium: 123 mmol/L — ABNORMAL LOW (ref 135–145)
Total Bilirubin: 0.1 mg/dL — ABNORMAL LOW (ref 0.3–1.2)
Total Protein: 7.5 g/dL (ref 6.5–8.1)

## 2021-09-05 LAB — CBC WITH DIFFERENTIAL/PLATELET
Abs Immature Granulocytes: 0.09 10*3/uL — ABNORMAL HIGH (ref 0.00–0.07)
Basophils Absolute: 0 10*3/uL (ref 0.0–0.1)
Basophils Relative: 0 %
Eosinophils Absolute: 0.1 10*3/uL (ref 0.0–0.5)
Eosinophils Relative: 1 %
HCT: 34.8 % — ABNORMAL LOW (ref 36.0–46.0)
Hemoglobin: 11.8 g/dL — ABNORMAL LOW (ref 12.0–15.0)
Immature Granulocytes: 1 %
Lymphocytes Relative: 5 %
Lymphs Abs: 0.7 10*3/uL (ref 0.7–4.0)
MCH: 29.4 pg (ref 26.0–34.0)
MCHC: 33.9 g/dL (ref 30.0–36.0)
MCV: 86.6 fL (ref 80.0–100.0)
Monocytes Absolute: 1.2 10*3/uL — ABNORMAL HIGH (ref 0.1–1.0)
Monocytes Relative: 9 %
Neutro Abs: 11.8 10*3/uL — ABNORMAL HIGH (ref 1.7–7.7)
Neutrophils Relative %: 84 %
Platelets: 410 10*3/uL — ABNORMAL HIGH (ref 150–400)
RBC: 4.02 MIL/uL (ref 3.87–5.11)
RDW: 11.5 % (ref 11.5–15.5)
WBC: 13.9 10*3/uL — ABNORMAL HIGH (ref 4.0–10.5)
nRBC: 0 % (ref 0.0–0.2)

## 2021-09-05 LAB — RAD ONC ARIA SESSION SUMMARY
Course Elapsed Days: 8
Plan Fractions Treated to Date: 5
Plan Prescribed Dose Per Fraction: 2 Gy
Plan Total Fractions Prescribed: 35
Plan Total Prescribed Dose: 70 Gy
Reference Point Dosage Given to Date: 10 Gy
Reference Point Session Dosage Given: 2 Gy
Session Number: 5

## 2021-09-05 LAB — MAGNESIUM: Magnesium: 2 mg/dL (ref 1.7–2.4)

## 2021-09-05 MED ORDER — SODIUM CHLORIDE 0.9 % IV SOLN
40.0000 mg/m2 | Freq: Once | INTRAVENOUS | Status: AC
  Administered 2021-09-05: 56 mg via INTRAVENOUS
  Filled 2021-09-05: qty 56

## 2021-09-05 MED ORDER — PROMETHAZINE HCL 25 MG PO TABS: 12.5000 mg | ORAL_TABLET | Freq: Three times a day (TID) | ORAL | 0 refills | Status: AC | PRN

## 2021-09-05 MED ORDER — SODIUM CHLORIDE 0.9 % IV SOLN
150.0000 mg | Freq: Once | INTRAVENOUS | Status: AC
  Administered 2021-09-05: 150 mg via INTRAVENOUS
  Filled 2021-09-05: qty 150

## 2021-09-05 MED ORDER — SODIUM CHLORIDE 0.9 % IV SOLN
Freq: Once | INTRAVENOUS | Status: AC
  Filled 2021-09-05: qty 250

## 2021-09-05 MED ORDER — HEPARIN SOD (PORK) LOCK FLUSH 100 UNIT/ML IV SOLN
INTRAVENOUS | Status: AC
  Administered 2021-09-05: 500 [IU]
  Filled 2021-09-05: qty 5

## 2021-09-05 MED ORDER — PALONOSETRON HCL INJECTION 0.25 MG/5ML
0.2500 mg | Freq: Once | INTRAVENOUS | Status: AC
  Administered 2021-09-05: 0.25 mg via INTRAVENOUS
  Filled 2021-09-05: qty 5

## 2021-09-05 MED ORDER — POTASSIUM CHLORIDE IN NACL 20-0.9 MEQ/L-% IV SOLN
Freq: Once | INTRAVENOUS | Status: AC
  Filled 2021-09-05: qty 1000

## 2021-09-05 MED ORDER — HYDROCODONE-ACETAMINOPHEN 10-325 MG PO TABS: 1.0000 | ORAL_TABLET | Freq: Four times a day (QID) | ORAL | 0 refills | Status: DC | PRN

## 2021-09-05 MED ORDER — HEPARIN SOD (PORK) LOCK FLUSH 100 UNIT/ML IV SOLN
500.0000 [IU] | Freq: Once | INTRAVENOUS | Status: AC | PRN
  Filled 2021-09-05: qty 5

## 2021-09-05 MED ORDER — MAGNESIUM SULFATE 2 GM/50ML IV SOLN
2.0000 g | Freq: Once | INTRAVENOUS | Status: AC
  Administered 2021-09-05: 2 g via INTRAVENOUS
  Filled 2021-09-05: qty 50

## 2021-09-05 MED ORDER — SODIUM CHLORIDE 0.9 % IV SOLN
10.0000 mg | Freq: Once | INTRAVENOUS | Status: AC
  Administered 2021-09-05: 10 mg via INTRAVENOUS
  Filled 2021-09-05: qty 10

## 2021-09-05 NOTE — Patient Instructions (Addendum)
[facility] CANCER CTR AT [place]  Discharge Instructions: Thank you for choosing [facility] to provide your oncology and hematology care.  If you have a lab appointment with the [facility], please go directly to the [facility] and check in at the registration area.  Wear comfortable clothing and clothing appropriate for easy access to any Portacath or PICC line.   We strive to give you quality time with your provider. You may need to reschedule your appointment if you arrive late (15 or more minutes).  Arriving late affects you and other patients whose appointments are after yours.  Also, if you miss three or more appointments without notifying the office, you may be dismissed from the clinic at the provider's discretion.      For prescription refill requests, have your pharmacy contact our office and allow 72 hours for refills to be completed.    Today you received the following chemotherapy and/or immunotherapy agents Cisplatin     To help prevent nausea and vomiting after your treatment, we encourage you to take your nausea medication as directed.  BELOW ARE SYMPTOMS THAT SHOULD BE REPORTED IMMEDIATELY: *FEVER GREATER THAN 100.4 F (38 C) OR HIGHER *CHILLS OR SWEATING *NAUSEA AND VOMITING THAT IS NOT CONTROLLED WITH YOUR NAUSEA MEDICATION *UNUSUAL SHORTNESS OF BREATH *UNUSUAL BRUISING OR BLEEDING *URINARY PROBLEMS (pain or burning when urinating, or frequent urination) *BOWEL PROBLEMS (unusual diarrhea, constipation, pain near the anus) TENDERNESS IN MOUTH AND THROAT WITH OR WITHOUT PRESENCE OF ULCERS (sore throat, sores in mouth, or a toothache) UNUSUAL RASH, SWELLING OR PAIN  UNUSUAL VAGINAL DISCHARGE OR ITCHING   Items with * indicate a potential emergency and should be followed up as soon as possible or go to the Emergency Department if any problems should occur.  Please show the CHEMOTHERAPY ALERT CARD or IMMUNOTHERAPY ALERT CARD at check-in to  the Emergency Department and triage nurse.  Should you have questions after your visit or need to cancel or reschedule your appointment, please contact [facility] CANCER [place] AT [place]  [phone] and follow the prompts.  Office hours are 8:00 a.m. to 4:30 p.m. Monday - Friday. Please note that voicemails left after 4:00 p.m. may not be returned until the following business day.  We are closed weekends and major holidays. You have access to a nurse at all times for urgent questions. Please call the main number to the clinic [phone] and follow the prompts.  For any non-urgent questions, you may also contact your provider using MyChart. We now offer e-Visits for anyone 27 and older to request care online for non-urgent symptoms. For details visit [email or web address].   Also download the MyChart app! Go to the app store, search "MyChart", open the app, select [facility], and log in with your MyChart username and password.  Due to Covid, a mask is required upon entering the hospital/clinic. If you do not have a mask, one will be given to you upon arrival. For doctor visits, patients may have 1 support person aged 92 or older with them. For treatment visits, patients cannot have anyone with them due to current Covid guidelines and our immunocompromised population.

## 2021-09-05 NOTE — Progress Notes (Signed)
[place] NOTE  Patient Care Team: Revelo, Elyse Jarvis, MD as PCP - General (Family Medicine) Cammie Sickle, MD as Consulting Physician (Oncology)  CHIEF COMPLAINTS/PURPOSE OF CONSULTATION: head and neck cancer  #  Oncology History Overview Note  # April 26th, 2023-  NASOPHARYNGEAL MASS; ENDOSCOPIC BIOPSY:  - POORLY DIFFERENTIATED CARCINOMA, WITH SQUAMOUS DIFFERENTIATION AND  KERATINIZATION. EBV-NEGATIVE. [Dr.Bennett: ]  Comment:  Immunohistochemical studies show tumor cells to be strongly and  diffusely positive for CK5/6 and p40, and negative for S100, CD45 (LCA),  desmin, and p16  #  PET sacn May 5th, 2023-  Posterior nasopharyngeal mass maximum SUV 12.7, compatible with malignancy; Hypermetabolic right level IIb and right level IV lymph nodes compatible with malignant spread. 3. A 4 mm in short axis left level IIb lymph node has a maximum SUV of 2.1 which is about at the level of the blood pool, probably not involved. 4. Other imaging findings of potential clinical significance: Aortic Atherosclerosis (ICD10-I70.0). Coronary and systemic atherosclerosis. Emphysema (ICD10-J43.9). Sigmoid colon diverticulosis. Scoliosis.     Electronically Signed   By: Van Clines M.D.   On: 08/08/2021 10:56  # STAGE II nasopharyngeal carcinoma [Dr.Bennett];   # MAY 23rd- cisplatin weekly-RT; 5/25- RT   Nasopharyngeal carcinoma ([place])  08/11/2021 Initial Diagnosis   Nasopharyngeal carcinoma ([place])   08/11/2021 Cancer Staging   Staging form: Pharynx - Nasopharynx, AJCC 8th Edition - Clinical: Stage II (cT2, cN1, cM0) - Signed by Cammie Sickle, MD on 08/11/2021 Stage prefix: Initial diagnosis   08/26/2021 -  Chemotherapy   Patient is on Treatment Plan : HEAD/NECK Cisplatin q7d + XRT x 6 Cycles / Cisplatin D1 + 5FU IVCI D1-4 q28d x 3 Cycles        HISTORY OF PRESENTING ILLNESS: Alone.  Ambulating independently. Vickie Caldwell 71 y.o.   female newly diagnosed nasopharyngeal carcinoma is here to proceed with chemoradiation. She started radiation on 5/25. Port has been placed and functioning well. Says that she's tolerating treatment and radiation well. She has ongoing pain but says pain medication is controlling pain well. Compazine worsens her headaches. Requests refill of norco. She is trying to cut back on smoking. Has reduced alcohol intake.    Review of Systems  Constitutional:  Positive for malaise/fatigue. Negative for chills, fever and weight loss.  HENT:  Positive for sore throat. Negative for hearing loss, nosebleeds and tinnitus.        Increased mucous in throat and thickness  Eyes:  Negative for blurred vision and double vision.  Respiratory:  Negative for cough, hemoptysis, shortness of breath and wheezing.   Cardiovascular:  Negative for chest pain, palpitations and leg swelling.  Gastrointestinal:  Negative for abdominal pain, blood in stool, constipation, diarrhea, melena, nausea and vomiting.  Genitourinary:  Negative for dysuria and urgency.  Musculoskeletal:  Negative for back pain, falls, joint pain and myalgias.  Skin:  Negative for itching and rash.  Neurological:  Negative for dizziness, tingling, sensory change, loss of consciousness, weakness and headaches.  Endo/Heme/Allergies:  Negative for environmental allergies. Does not bruise/bleed easily.  Psychiatric/Behavioral:  Negative for depression. The patient is not nervous/anxious and does not have insomnia.      MEDICAL HISTORY:  Past Medical History:  Diagnosis Date   Asthma    COPD (chronic obstructive pulmonary disease) (HCC)    Dysrhythmia    GERD (gastroesophageal reflux disease)    History of kidney stones    Hypertension    Osteoarthritis  TIA (transient ischemic attack) 2011   No Deficits   Vertigo     SURGICAL HISTORY: Past Surgical History:  Procedure Laterality Date   ABDOMINAL HYSTERECTOMY     BREAST BIOPSY Left 1980's    neg. Pt states punch biopsy at the nipple   IR IMAGING GUIDED PORT INSERTION  08/14/2021   LYMPH NODE DISSECTION Right    neck   MANDIBLE SURGERY     Sagittal split   NASAL ENDOSCOPY Bilateral 07/23/2021   Procedure: NASAL ENDOSCOPY WITH BIOPSY OF NASOPHARYNGEAL MASS;  Surgeon: Clyde Canterbury, MD;  Location: [facility] ORS;  Service: ENT;  Laterality: Bilateral;   THROAT SURGERY     Vocal cord polyps "burned"   TUBAL LIGATION     WISDOM TOOTH EXTRACTION      SOCIAL HISTORY: Social History   Socioeconomic History   Marital status: Single    Spouse name: Not on file   Number of children: Not on file   Years of education: Not on file   Highest education level: Not on file  Occupational History   Not on file  Tobacco Use   Smoking status: Every Day    Packs/day: 1.00    Years: 51.00    Pack years: 51.00    Types: Cigarettes   Smokeless tobacco: Never   Tobacco comments:    Started smoking around age 25  Vaping Use   Vaping Use: Never used  Substance and Sexual Activity   Alcohol use: Yes    Alcohol/week: 1.0 standard drink    Types: 1 Cans of beer per week    Comment: occasional   Drug use: Never   Sexual activity: Not Currently    Birth control/protection: None  Other Topics Concern   Not on file  Social History Narrative   Smoker; sometimes beer/wine; used to work for Therapist, 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 Lives in [place]. With daughter-ZES//brother. From [place].    Social Determinants of Health   Financial Resource Strain: Not on file  Food Insecurity: Not on file  Transportation Needs: Not on file  Physical Activity: Not on file  Stress: Not on file  Social Connections: Not on file  Intimate Partner Violence: Not on file    FAMILY HISTORY: Family History  Problem Relation Age of Onset   Coronary artery disease Mother    Cancer Father 37       Oral   Cancer Brother 20       testicular   Prostate cancer Brother    Heart failure Brother    Breast cancer  Neg Hx     ALLERGIES:  is allergic to codeine and penicillins.  MEDICATIONS:  Current Outpatient Medications  Medication Sig Dispense Refill   acetaminophen (TYLENOL) 325 MG tablet Take 650 mg by mouth every 6 (six) hours as needed.     albuterol (VENTOLIN HFA) 108 (90 Base) MCG/ACT inhaler Inhale into the lungs every 6 (six) hours as needed for wheezing or shortness of breath.     amLODipine (NORVASC) 10 MG tablet Take 10 mg by mouth daily.     ASPIRIN 81 PO Take by mouth daily.     Cholecalciferol (VITAMIN D3 PO) Take by mouth daily.     famotidine (PEPCID) 10 MG tablet Take 20 mg by mouth daily.     fluticasone (FLONASE) 50 MCG/ACT nasal spray Place into both nostrils daily.     HYDROcodone-acetaminophen (NORCO) 10-325 MG tablet Take 1 tablet by mouth every 6 (six) hours as needed.  30 tablet 0   lidocaine-prilocaine (EMLA) cream Apply on the port. 30 -45 min  prior to port access. 30 g 3   metoprolol succinate (TOPROL-XL) 100 MG 24 hr tablet Take 100 mg by mouth daily. Take with or immediately following a meal.     montelukast (SINGULAIR) 10 MG tablet Take 10 mg by mouth daily.     ondansetron (ZOFRAN) 8 MG tablet One pill every 8 hours as needed for nausea/vomitting. 40 tablet 1   prochlorperazine (COMPAZINE) 10 MG tablet Take 1 tablet (10 mg total) by mouth every 6 (six) hours as needed for nausea or vomiting. 40 tablet 1   umeclidinium bromide (INCRUSE ELLIPTA) 62.5 MCG/ACT AEPB Inhale 1 puff into the lungs daily.     No current facility-administered medications for this visit.    PHYSICAL EXAMINATION: ECOG PERFORMANCE STATUS: 1 - Symptomatic but completely ambulatory  Vitals:   09/05/21 0857  BP: 122/72  Pulse: 86  Resp: 16  Temp: (!) 96.8 F (36 C)  SpO2: 99%   Filed Weights   09/05/21 0857  Weight: 104 lb 8 oz (47.4 kg)   Physical Exam Vitals and nursing note reviewed.  HENT:     Head: Normocephalic and atraumatic.     Mouth/Throat:     Mouth: Mucous membranes  are moist.     Pharynx: Oropharynx is clear.  Neck:     Comments: Approximately 2-3 cm lymph node noted in the right submandibular region. Cardiovascular:     Rate and Rhythm: Normal rate and regular rhythm.  Pulmonary:     Comments: Decreased breath sounds bilaterally.  Abdominal:     Palpations: Abdomen is soft.  Musculoskeletal:     Cervical back: Normal range of motion.  Skin:    General: Skin is warm and dry.  Neurological:     Mental Status: She is alert and oriented to person, place, and time.  Psychiatric:        Behavior: Behavior normal.        Judgment: Judgment normal.     LABORATORY DATA:  I have reviewed the data as listed Lab Results  Component Value Date   WBC 13.9 (H) 09/05/2021   HGB 11.8 (L) 09/05/2021   HCT 34.8 (L) 09/05/2021   MCV 86.6 09/05/2021   PLT 410 (H) 09/05/2021   Recent Labs    08/11/21 1238 08/26/21 0832 09/05/21 0815  NA 130* 125* 123*  K 3.6 3.5 4.1  CL 96* 91* 88*  CO2 '28 25 26  '$ GLUCOSE 106* 210* 138*  BUN 6* 10 7*  CREATININE 0.56 0.51 0.41*  CALCIUM 9.0 9.2 9.0  GFRNONAA >60 >60 >60  PROT 7.7 7.4 7.5  ALBUMIN 4.1 3.8 3.4*  AST '22 21 16  '$ ALT '19 16 19  '$ ALKPHOS 88 86 100  BILITOT 0.6 0.4 0.1*     RADIOGRAPHIC STUDIES: I have personally reviewed the radiological images as listed and agreed with the findings in the report. NM PET Image Initial (PI) Skull Base To Thigh (F-18 FDG)  Result Date: 08/08/2021 CLINICAL DATA:  Initial treatment strategy for poorly differentiated nasopharyngeal carcinoma with squamous differentiation and keratinization. EXAM: NUCLEAR MEDICINE PET SKULL BASE TO THIGH TECHNIQUE: 6.0 mCi F-18 FDG was injected intravenously. Full-ring PET imaging was performed from the skull base to thigh after the radiotracer. CT data was obtained and used for attenuation correction and anatomic localization. Fasting blood glucose: 114 mg/dl COMPARISON:  Chest CT 03/12/2020 FINDINGS: Mediastinal blood pool activity: SUV  max  2.0 Liver activity: SUV max NA NECK: Abnormal accentuated activity along the posterior nasopharynx along the mucosal pharyngeal space, with right greater than left extension potentially along the right prevertebral space, with maximum SUV 12.7. Several clustered right level IIb lymph nodes are hypermetabolic, the largest of these is a 1.0 cm lymph node on image 42 of series 2 with maximum SUV 6.5 A left level IIb lymph node on image 42 of series 2 has a short axis diameter of 0.4 cm and a maximum SUV of 2.1 which is near blood pool. A right level IV lymph node measuring 0.9 cm in short axis on image 60 of series 2 has a maximum SUV of 9.8. Just above the thoracic inlet along the posterior left thyroid lobe, a 1.4 by 0.9 cm hypodense lesion is present on image 63 of series 2, maximum SUV 1.9 which is not greater than the rest of the thyroid gland. This finding was also present on the prior chest CT of 09/23/2017 and is probably a small thyroid nodule rather than a lymph node. Not clinically significant; no follow-up imaging recommended (ref: J Am Coll Radiol. 2015 Feb;12(2): 143-50). Incidental CT findings: Bilateral common carotid atherosclerotic calcification. CHEST: No significant abnormal hypermetabolic activity in this region. Incidental CT findings: Coronary, aortic arch, and branch vessel atherosclerotic vascular disease. Centrilobular emphysema. ABDOMEN/PELVIS: No significant abnormal hypermetabolic activity in this region. Incidental CT findings: Atherosclerosis is present, including aortoiliac atherosclerotic disease. Sigmoid colon diverticulosis. SKELETON: No significant abnormal hypermetabolic activity in this region. Incidental CT findings: Thoracolumbar scoliosis. Old healed right posterior rib fractures. IMPRESSION: 1. Posterior nasopharyngeal mass maximum SUV 12.7, compatible with malignancy. 2. Hypermetabolic right level IIb and right level IV lymph nodes compatible with malignant spread. 3. A 4  mm in short axis left level IIb lymph node has a maximum SUV of 2.1 which is about at the level of the blood pool, probably not involved. 4. Other imaging findings of potential clinical significance: Aortic Atherosclerosis (ICD10-I70.0). Coronary and systemic atherosclerosis. Emphysema (ICD10-J43.9). Sigmoid colon diverticulosis. Scoliosis. Electronically Signed   By: Van Clines M.D.   On: 08/08/2021 10:56   IR IMAGING GUIDED PORT INSERTION  Result Date: 08/14/2021 INDICATION: Nasopharyngeal carcinoma EXAM: IMPLANTED PORT A CATH PLACEMENT WITH ULTRASOUND AND FLUOROSCOPIC GUIDANCE MEDICATIONS: None ANESTHESIA/SEDATION: Moderate (conscious) sedation was employed during this procedure. A total of Versed 1.5 mg and Fentanyl 75 mcg was administered intravenously by the radiology nurse. Total intra-service moderate Sedation Time: 16 minutes. The patient's level of consciousness and vital signs were monitored continuously by radiology nursing throughout the procedure under my direct supervision. FLUOROSCOPY: Radiation Exposure Index (as provided by the fluoroscopic device): 2.1 mGy Kerma COMPLICATIONS: None immediate. PROCEDURE: The procedure, risks, benefits, and alternatives were explained to the patient. Questions regarding the procedure were encouraged and answered. The patient understands and consents to the procedure. A timeout was performed prior to the initiation of the procedure. Patient positioned supine on the angiography table. Right neck and anterior upper chest prepped and draped in the usual sterile fashion. All elements of maximal sterile barrier were utilized including, cap, mask, sterile gown, sterile gloves, large sterile drape, hand scrubbing and 2% Chlorhexidine for skin cleaning. The right internal jugular vein was evaluated with ultrasound and shown to be patent. A permanent ultrasound image was obtained and placed in the patient's medical record. Local anesthesia was provided with 1%  lidocaine with epinephrine. Using sterile gel and a sterile probe cover, the right internal jugular vein was entered with  a 21 ga needle during real time ultrasound guidance. 0.018 inch guidewire placed and 21 ga needle exchanged for transitional dilator set. Utilizing fluoroscopy, 0.035 inch guidewire advanced through the needle without difficulty. Attention then turned to the right anterior upper chest. Following local lidocaine administration, a port pocket was created. The catheter was connected to the port and brought from the pocket to the venotomy site through a subcutaneous tunnel. The catheter was cut to size and inserted through the peel-away sheath. The catheter tip was positioned at the cavoatrial junction using fluoroscopic guidance. The port aspirated and flushed well. The port pocket was closed with deep and superficial absorbable suture. The port pocket incision and venotomy sites were also sealed with Dermabond. IMPRESSION: Successful placement of a right internal jugular approach power injectable Port-A-Cath. The catheter is ready for immediate use. Electronically Signed   By: Miachel Roux M.D.   On: 08/14/2021 14:05    ASSESSMENT & PLAN:   No problem-specific Assessment & Plan notes found for this encounter.  Nasopharyngeal carcinoma [facility]) # April 26th, 2023-  NASOPHARYNGEAL SQUAMOUS EBV-NEGATIVE. [Dr.Bennett: ]; STAGE II [T2 N1]; #  PET scan May 5th, 2023-  Posterior nasopharyngeal mass maximum SUV 12.7, compatible with malignancy; Hypermetabolic right level IIb and right level IV lymph nodes compatible with malignant spread. Discussed at the tumor conference.  Discussed with Dr. Katina Dung. Donella Stade.  On definitive chemoradiation cisplatin followed by adjuvant chemotherapy given the nasopharyngeal disease.  Patient on chemoradiation -concurrent radiation-  [5/25 to july14th*].   # Labs reviewed and acceptable for treatment. Proceed with cycle 2 of cisplatin today.    # Risk of weight  loss / difficulty swallowing- Followed by Joli. Weight down 2 lbs.    # Smoking: 1 ppd/day; in process of quitting smoking. Cutting back. Ok to use nicotine patches. Encouraged her to self-refer to [facility] program.    # Chronic Hyponatremia: Per patient, has 1 functioning kidney. Sodium 123 today. Await appt with nephrology.    # IV access/Mediport-functional.   # pain- norco refilled today. Encouraged bowel prophylaxis.   # Nausea- intolerant to compazine. Rotate to phenergan 12.5 mg. Reviewed risk of drowsiness.   # Goals of care- treatment given with curative intent.    DISPOSITION: # chemo today # follow up in 1 weeks- port/labs (cbc/cmp/mag), Dr. Jacinto Reap, cisplatin- la  All questions were answered. The patient knows to call the clinic with any problems, questions or concerns.  Verlon Au, NP 09/05/2021

## 2021-09-08 ENCOUNTER — Ambulatory Visit
Admission: RE | Admit: 2021-09-08 | Discharge: 2021-09-08 | Disposition: A | Discharge status: Home / Self Care | Payer: Medicare HMO | Source: Ambulatory Visit | Attending: Radiation Oncology | Admitting: Radiation Oncology

## 2021-09-08 ENCOUNTER — Other Ambulatory Visit: Payer: Self-pay

## 2021-09-08 DIAGNOSIS — Z510 Encounter for antineoplastic radiation therapy: Secondary | ICD-10-CM | POA: Diagnosis not present

## 2021-09-08 LAB — RAD ONC ARIA SESSION SUMMARY
Course Elapsed Days: 11
Plan Fractions Treated to Date: 6
Plan Prescribed Dose Per Fraction: 2 Gy
Plan Total Fractions Prescribed: 35
Plan Total Prescribed Dose: 70 Gy
Reference Point Dosage Given to Date: 12 Gy
Reference Point Session Dosage Given: 2 Gy
Session Number: 6

## 2021-09-09 ENCOUNTER — Ambulatory Visit
Admission: RE | Admit: 2021-09-09 | Discharge: 2021-09-09 | Disposition: A | Discharge status: Home / Self Care | Payer: Medicare HMO | Source: Ambulatory Visit | Attending: Radiation Oncology | Admitting: Radiation Oncology

## 2021-09-09 ENCOUNTER — Other Ambulatory Visit: Payer: Self-pay

## 2021-09-09 ENCOUNTER — Inpatient Hospital Stay: Payer: Medicare HMO

## 2021-09-09 DIAGNOSIS — Z510 Encounter for antineoplastic radiation therapy: Secondary | ICD-10-CM | POA: Diagnosis not present

## 2021-09-09 LAB — RAD ONC ARIA SESSION SUMMARY
Course Elapsed Days: 12
Plan Fractions Treated to Date: 7
Plan Prescribed Dose Per Fraction: 2 Gy
Plan Total Fractions Prescribed: 35
Plan Total Prescribed Dose: 70 Gy
Reference Point Dosage Given to Date: 14 Gy
Reference Point Session Dosage Given: 2 Gy
Session Number: 7

## 2021-09-09 NOTE — Progress Notes (Signed)
Nutrition Follow-up:   Patient with SCC of nasopharynx.  Patient receiving concurrent chemotherapy and radiation.   Met with patient following radiation.  Patient reports that her taste is altered.  "Everything taste like medicine."  Intake has decreased.  Says that she had issues with constipation which has resolved recently.  Says that she is drinking carnation instant breakfast.  Liked the ensure and boost shakes not the orgain and [employer] shakes.  Has not tried the glutamine samples yet.  Reports soreness with swallowing.     Medications: reviewed  Labs: Na 123  Anthropometrics:   Weight 104 lb 8 oz on 6/2  106 lb 6.4 oz on 5/23  UBW of 104 lb-110 lb per patient   NUTRITION DIAGNOSIS: Predicted sub optimal energy intake continues   INTERVENTION:  Recommend patient start glutamine right away.  Most effective when started early in treatment.  Recommend patient start drinking Boost VHC or ensure complete/plus/boost plus 1-2 times per day.   Provided handout on soft moist protein foods.   Wrote down daily calorie and protein goals for patient  Discussed SLP evaluation with patient.  Will send message to MD. If weight continues to decline would recommend discussion regarding feeding tube placement    MONITORING, EVALUATION, GOAL: weight trends, intake   NEXT VISIT: Friday, June 16 during infusion  Joli B. Allen, RD, LDN Registered Dietitian [phone]   

## 2021-09-10 ENCOUNTER — Telehealth: Payer: Self-pay

## 2021-09-10 ENCOUNTER — Other Ambulatory Visit: Payer: Self-pay

## 2021-09-10 ENCOUNTER — Ambulatory Visit
Admission: RE | Admit: 2021-09-10 | Discharge: 2021-09-10 | Disposition: A | Discharge status: Home / Self Care | Payer: Medicare HMO | Source: Ambulatory Visit | Attending: Radiation Oncology | Admitting: Radiation Oncology

## 2021-09-10 DIAGNOSIS — C119 Malignant neoplasm of nasopharynx, unspecified: Secondary | ICD-10-CM

## 2021-09-10 DIAGNOSIS — Z510 Encounter for antineoplastic radiation therapy: Secondary | ICD-10-CM | POA: Diagnosis not present

## 2021-09-10 LAB — RAD ONC ARIA SESSION SUMMARY
Course Elapsed Days: 13
Plan Fractions Treated to Date: 8
Plan Prescribed Dose Per Fraction: 2 Gy
Plan Total Fractions Prescribed: 35
Plan Total Prescribed Dose: 70 Gy
Reference Point Dosage Given to Date: 16 Gy
Reference Point Session Dosage Given: 2 Gy
Session Number: 8

## 2021-09-10 NOTE — Telephone Encounter (Signed)
-----   Message from Cammie Sickle, MD sent at 09/09/2021  6:53 PM EDT ----- Regarding: RE: Annamaria Helling.   Please make the referral to Speech Path.  GB ----- Message ----- From: Jennet Maduro, RD Sent: 09/09/2021  11:54 AM EDT To: Cammie Sickle, MD  Dr B,  Recommend evaluation by Stormy Fabian, Speech Therapist since she is receiving radiation.  If you agree, please place Ambulatory referral to SLP to evaluate and treat.   I am concerned about patient's weight loss and if it continues will likely need discussion regarding feeding tube placement during remainder of treatment.   Will continue to follow.  Joli

## 2021-09-10 NOTE — Telephone Encounter (Signed)
Referral placed.

## 2021-09-11 ENCOUNTER — Ambulatory Visit
Admission: RE | Admit: 2021-09-11 | Discharge: 2021-09-11 | Disposition: A | Discharge status: Home / Self Care | Payer: Medicare HMO | Source: Ambulatory Visit | Attending: Radiation Oncology | Admitting: Radiation Oncology

## 2021-09-11 ENCOUNTER — Other Ambulatory Visit: Payer: Self-pay

## 2021-09-11 DIAGNOSIS — Z510 Encounter for antineoplastic radiation therapy: Secondary | ICD-10-CM | POA: Diagnosis not present

## 2021-09-11 LAB — RAD ONC ARIA SESSION SUMMARY
Course Elapsed Days: 14
Plan Fractions Treated to Date: 9
Plan Prescribed Dose Per Fraction: 2 Gy
Plan Total Fractions Prescribed: 35
Plan Total Prescribed Dose: 70 Gy
Reference Point Dosage Given to Date: 18 Gy
Reference Point Session Dosage Given: 2 Gy
Session Number: 9

## 2021-09-12 ENCOUNTER — Inpatient Hospital Stay: Payer: Medicare HMO

## 2021-09-12 ENCOUNTER — Inpatient Hospital Stay (HOSPITAL_BASED_OUTPATIENT_CLINIC_OR_DEPARTMENT_OTHER): Payer: Medicare HMO | Admitting: Internal Medicine

## 2021-09-12 ENCOUNTER — Ambulatory Visit
Admission: RE | Admit: 2021-09-12 | Discharge: 2021-09-12 | Disposition: A | Discharge status: Home / Self Care | Payer: Medicare HMO | Source: Ambulatory Visit | Attending: Radiation Oncology | Admitting: Radiation Oncology

## 2021-09-12 ENCOUNTER — Other Ambulatory Visit: Payer: Self-pay

## 2021-09-12 ENCOUNTER — Telehealth: Payer: Self-pay

## 2021-09-12 ENCOUNTER — Encounter: Payer: Self-pay | Admitting: Internal Medicine

## 2021-09-12 VITALS — BP 120/71 | HR 82

## 2021-09-12 DIAGNOSIS — Z510 Encounter for antineoplastic radiation therapy: Secondary | ICD-10-CM | POA: Diagnosis not present

## 2021-09-12 DIAGNOSIS — C119 Malignant neoplasm of nasopharynx, unspecified: Secondary | ICD-10-CM

## 2021-09-12 DIAGNOSIS — Z5111 Encounter for antineoplastic chemotherapy: Secondary | ICD-10-CM | POA: Diagnosis not present

## 2021-09-12 LAB — COMPREHENSIVE METABOLIC PANEL
ALT: 18 U/L (ref 0–44)
AST: 15 U/L (ref 15–41)
Albumin: 3.2 g/dL — ABNORMAL LOW (ref 3.5–5.0)
Alkaline Phosphatase: 86 U/L (ref 38–126)
Anion gap: 7 (ref 5–15)
BUN: 12 mg/dL (ref 8–23)
CO2: 27 mmol/L (ref 22–32)
Calcium: 9.1 mg/dL (ref 8.9–10.3)
Chloride: 87 mmol/L — ABNORMAL LOW (ref 98–111)
Creatinine, Ser: 0.43 mg/dL — ABNORMAL LOW (ref 0.44–1.00)
GFR, Estimated: >60 mL/min (ref >60)
Glucose, Bld: 144 mg/dL — ABNORMAL HIGH (ref 70–99)
Potassium: 4 mmol/L (ref 3.5–5.1)
Sodium: 121 mmol/L — ABNORMAL LOW (ref 135–145)
Total Bilirubin: 0.2 mg/dL — ABNORMAL LOW (ref 0.3–1.2)
Total Protein: 7.2 g/dL (ref 6.5–8.1)

## 2021-09-12 LAB — CBC WITH DIFFERENTIAL/PLATELET
Abs Immature Granulocytes: 0.09 10*3/uL — ABNORMAL HIGH (ref 0.00–0.07)
Basophils Absolute: 0 10*3/uL (ref 0.0–0.1)
Basophils Relative: 0 %
Eosinophils Absolute: 0 10*3/uL (ref 0.0–0.5)
Eosinophils Relative: 0 %
HCT: 31.9 % — ABNORMAL LOW (ref 36.0–46.0)
Hemoglobin: 10.7 g/dL — ABNORMAL LOW (ref 12.0–15.0)
Immature Granulocytes: 1 %
Lymphocytes Relative: 4 %
Lymphs Abs: 0.5 10*3/uL — ABNORMAL LOW (ref 0.7–4.0)
MCH: 28.7 pg (ref 26.0–34.0)
MCHC: 33.5 g/dL (ref 30.0–36.0)
MCV: 85.5 fL (ref 80.0–100.0)
Monocytes Absolute: 1.3 10*3/uL — ABNORMAL HIGH (ref 0.1–1.0)
Monocytes Relative: 10 %
Neutro Abs: 11 10*3/uL — ABNORMAL HIGH (ref 1.7–7.7)
Neutrophils Relative %: 85 %
Platelets: 379 10*3/uL (ref 150–400)
RBC: 3.73 MIL/uL — ABNORMAL LOW (ref 3.87–5.11)
RDW: 11.5 % (ref 11.5–15.5)
WBC: 12.9 10*3/uL — ABNORMAL HIGH (ref 4.0–10.5)
nRBC: 0 % (ref 0.0–0.2)

## 2021-09-12 LAB — RAD ONC ARIA SESSION SUMMARY
Course Elapsed Days: 15
Plan Fractions Treated to Date: 10
Plan Prescribed Dose Per Fraction: 2 Gy
Plan Total Fractions Prescribed: 35
Plan Total Prescribed Dose: 70 Gy
Reference Point Dosage Given to Date: 20 Gy
Reference Point Session Dosage Given: 2 Gy
Session Number: 10

## 2021-09-12 LAB — MAGNESIUM: Magnesium: 1.8 mg/dL (ref 1.7–2.4)

## 2021-09-12 MED ORDER — HEPARIN SOD (PORK) LOCK FLUSH 100 UNIT/ML IV SOLN
500.0000 [IU] | Freq: Once | INTRAVENOUS | Status: AC | PRN
  Administered 2021-09-12: 500 [IU]
  Filled 2021-09-12: qty 5

## 2021-09-12 MED ORDER — SODIUM CHLORIDE 0.9 % IV SOLN
10.0000 mg | Freq: Once | INTRAVENOUS | Status: AC
  Administered 2021-09-12: 10 mg via INTRAVENOUS
  Filled 2021-09-12: qty 10

## 2021-09-12 MED ORDER — PALONOSETRON HCL INJECTION 0.25 MG/5ML
0.2500 mg | Freq: Once | INTRAVENOUS | Status: AC
  Administered 2021-09-12: 0.25 mg via INTRAVENOUS
  Filled 2021-09-12: qty 5

## 2021-09-12 MED ORDER — SODIUM CHLORIDE 0.9 % IV SOLN
40.0000 mg/m2 | Freq: Once | INTRAVENOUS | Status: AC
  Administered 2021-09-12: 56 mg via INTRAVENOUS
  Filled 2021-09-12: qty 56

## 2021-09-12 MED ORDER — DIAZEPAM 5 MG PO TABS: 5.0000 mg | ORAL_TABLET | Freq: Two times a day (BID) | ORAL | 0 refills | Status: DC | PRN

## 2021-09-12 MED ORDER — MAGNESIUM SULFATE 2 GM/50ML IV SOLN
2.0000 g | Freq: Once | INTRAVENOUS | Status: AC
  Administered 2021-09-12: 2 g via INTRAVENOUS
  Filled 2021-09-12: qty 50

## 2021-09-12 MED ORDER — MORPHINE SULFATE ER 30 MG PO TBCR: 30.0000 mg | EXTENDED_RELEASE_TABLET | Freq: Two times a day (BID) | ORAL | 0 refills | Status: AC

## 2021-09-12 MED ORDER — SODIUM CHLORIDE 0.9 % IV SOLN
Freq: Once | INTRAVENOUS | Status: AC
  Filled 2021-09-12: qty 250

## 2021-09-12 MED ORDER — POTASSIUM CHLORIDE IN NACL 20-0.9 MEQ/L-% IV SOLN
Freq: Once | INTRAVENOUS | Status: AC
  Filled 2021-09-12: qty 1000

## 2021-09-12 MED ORDER — SODIUM CHLORIDE 0.9 % IV SOLN
150.0000 mg | Freq: Once | INTRAVENOUS | Status: AC
  Administered 2021-09-12: 150 mg via INTRAVENOUS
  Filled 2021-09-12: qty 150

## 2021-09-12 MED ORDER — HYDROCODONE-ACETAMINOPHEN 10-325 MG PO TABS
1.0000 | ORAL_TABLET | Freq: Four times a day (QID) | ORAL | 0 refills | Status: DC | PRN
Start: 2021-09-12 — End: 2021-09-25

## 2021-09-12 NOTE — Telephone Encounter (Signed)
PA has been approved, notification faxed to [address].

## 2021-09-12 NOTE — Telephone Encounter (Signed)
PA for Diazepam submitted via cover my meds (Key: [id number])

## 2021-09-12 NOTE — Progress Notes (Unsigned)
[name] NOTE  Patient Care Team: Caldwell, Vickie Jarvis, MD as PCP - General (Family Medicine) Vickie Sickle, MD as Consulting Physician (Oncology)  CHIEF COMPLAINTS/PURPOSE OF CONSULTATION: head and neck cancer  #  Oncology History Overview Note  # April 26th, 2023-  NASOPHARYNGEAL MASS; ENDOSCOPIC BIOPSY:  - POORLY DIFFERENTIATED CARCINOMA, WITH SQUAMOUS DIFFERENTIATION AND  KERATINIZATION. EBV-NEGATIVE. [Dr.Bennett: ]  Comment:  Immunohistochemical studies show tumor cells to be strongly and  diffusely positive for CK5/6 and p40, and negative for S100, CD45 (LCA),  desmin, and p16  #  PET sacn May 5th, 2023-  Posterior nasopharyngeal mass maximum SUV 12.7, compatible with malignancy; Hypermetabolic right level IIb and right level IV lymph nodes compatible with malignant spread. 3. A 4 mm in short axis left level IIb lymph node has a maximum SUV of 2.1 which is about at the level of the blood pool, probably not involved. 4. Other imaging findings of potential clinical significance: Aortic Atherosclerosis (ICD10-I70.0). Coronary and systemic atherosclerosis. Emphysema (ICD10-J43.9). Sigmoid colon diverticulosis. Scoliosis.     Electronically Signed   By: Vickie Caldwell M.D.   On: 08/08/2021 10:56  # STAGE II nasopharyngeal carcinoma [Dr.Bennett];   # MAY 23rd- cisplatin weekly-RT; 5/25- RT   Nasopharyngeal carcinoma ([place])  08/11/2021 Initial Diagnosis   Nasopharyngeal carcinoma ([place])   08/11/2021 Cancer Staging   Staging form: Pharynx - Nasopharynx, AJCC 8th Edition - Clinical: Stage II (cT2, cN1, cM0) - Signed by Vickie Sickle, MD on 08/11/2021 Stage prefix: Initial diagnosis   08/26/2021 -  Chemotherapy   Patient is on Treatment Plan : HEAD/NECK Cisplatin q7d + XRT x 6 Cycles / Cisplatin D1 + 5FU IVCI D1-4 q28d x 3 Cycles        HISTORY OF PRESENTING ILLNESS: Alone.  Ambulating independently. Vickie Caldwell 71 y.o.   female newly diagnosed nasopharyngeal carcinoma  on cisplatin-radiation.   Pt very depressed, crying. She states she usually can get over it in a couple of days but feels like she may have a nervous breakdown like she did when she was 28.   Pulm took her off compazine and rx'd phenergan dure to the zofran/compazine combo causing headaches.  Patient interim underwent evaluation with radiation oncology.  Starting radiation on 5/25th.   In the interim patient also underwent a port placement.  Has an appointment with nephrology for chronic  hyponatremia in June.  Patient notes to have a lump in the right neck not significantly worse.  Review of Systems  Constitutional:  Positive for malaise/fatigue. Negative for chills, diaphoresis and fever.  HENT:  Negative for nosebleeds and sore throat.   Eyes:  Negative for double vision.  Respiratory:  Negative for cough, hemoptysis, sputum production, shortness of breath and wheezing.   Cardiovascular:  Negative for chest pain, palpitations, orthopnea and leg swelling.  Gastrointestinal:  Negative for abdominal pain, blood in stool, constipation, diarrhea, heartburn, melena, nausea and vomiting.  Genitourinary:  Negative for dysuria, frequency and urgency.  Musculoskeletal:  Positive for back pain and joint pain.  Skin: Negative.  Negative for itching and rash.  Neurological:  Positive for headaches. Negative for dizziness, tingling, focal weakness and weakness.  Endo/Heme/Allergies:  Does not bruise/bleed easily.  Psychiatric/Behavioral:  Negative for depression. The patient is not nervous/anxious and does not have insomnia.      MEDICAL HISTORY:  Past Medical History:  Diagnosis Date   Asthma    COPD (chronic obstructive pulmonary disease) ([place])  Dysrhythmia    GERD (gastroesophageal reflux disease)    History of kidney stones    Hypertension    Osteoarthritis    TIA (transient ischemic attack) 2011   No Deficits   Vertigo      SURGICAL HISTORY: Past Surgical History:  Procedure Laterality Date   ABDOMINAL HYSTERECTOMY     BREAST BIOPSY Left 1980's   neg. Pt states punch biopsy at the nipple   IR IMAGING GUIDED PORT INSERTION  08/14/2021   LYMPH NODE DISSECTION Right    neck   MANDIBLE SURGERY     Sagittal split   NASAL ENDOSCOPY Bilateral 07/23/2021   Procedure: NASAL ENDOSCOPY WITH BIOPSY OF NASOPHARYNGEAL MASS;  Surgeon: Vickie Canterbury, MD;  Location: [facility] ORS;  Service: ENT;  Laterality: Bilateral;   THROAT SURGERY     Vocal cord polyps "burned"   TUBAL LIGATION     WISDOM TOOTH EXTRACTION      SOCIAL HISTORY: Social History   Socioeconomic History   Marital status: Single    Spouse name: Not on file   Number of children: Not on file   Years of education: Not on file   Highest education level: Not on file  Occupational History   Not on file  Tobacco Use   Smoking status: Every Day    Packs/day: 1.00    Years: 51.00    Total pack years: 51.00    Types: Cigarettes   Smokeless tobacco: Never   Tobacco comments:    Started smoking around age 77  Vaping Use   Vaping Use: Never used  Substance and Sexual Activity   Alcohol use: Yes    Alcohol/week: 1.0 standard drink of alcohol    Types: 1 Cans of beer per week    Comment: occasional   Drug use: Never   Sexual activity: Not Currently    Birth control/protection: None  Other Topics Concern   Not on file  Social History Narrative   Smoker; sometimes beer/wine; used to work for Therapist, 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 Lives in [place]. With daughter-Vickie Caldwell//brother. From [place].    Social Determinants of Health   Financial Resource Strain: Not on file  Food Insecurity: Not on file  Transportation Needs: Not on file  Physical Activity: Not on file  Stress: Not on file  Social Connections: Not on file  Intimate Partner Violence: Not on file    FAMILY HISTORY: Family History  Problem Relation Age of Onset   Coronary artery  disease Mother    Cancer Father 42       Oral   Cancer Brother 30       testicular   Prostate cancer Brother    Heart failure Brother    Breast cancer Neg Hx     ALLERGIES:  is allergic to codeine and penicillins.  MEDICATIONS:  Current Outpatient Medications  Medication Sig Dispense Refill   acetaminophen (TYLENOL) 325 MG tablet Take 650 mg by mouth every 6 (six) hours as needed.     albuterol (VENTOLIN HFA) 108 (90 Base) MCG/ACT inhaler Inhale into the lungs every 6 (six) hours as needed for wheezing or shortness of breath.     amLODipine (NORVASC) 10 MG tablet Take 10 mg by mouth daily.     ASPIRIN 81 PO Take by mouth daily.     Cholecalciferol (VITAMIN D3 PO) Take by mouth daily.     famotidine (PEPCID) 10 MG tablet Take 20 mg by mouth daily.     fluticasone (FLONASE)  50 MCG/ACT nasal spray Place into both nostrils daily.     HYDROcodone-acetaminophen (NORCO) 10-325 MG tablet Take 1 tablet by mouth every 6 (six) hours as needed. 30 tablet 0   lidocaine-prilocaine (EMLA) cream Apply on the port. 30 -45 min  prior to port access. 30 g 3   metoprolol succinate (TOPROL-XL) 100 MG 24 hr tablet Take 100 mg by mouth daily. Take with or immediately following a meal.     montelukast (SINGULAIR) 10 MG tablet Take 10 mg by mouth daily.     ondansetron (ZOFRAN) 8 MG tablet One pill every 8 hours as needed for nausea/vomitting. 40 tablet 1   promethazine (PHENERGAN) 25 MG tablet Take 0.5 tablets (12.5 mg total) by mouth every 8 (eight) hours as needed for refractory nausea / vomiting. 30 tablet 0   umeclidinium bromide (INCRUSE ELLIPTA) 62.5 MCG/ACT AEPB Inhale 1 puff into the lungs daily.     No current facility-administered medications for this visit.     PHYSICAL EXAMINATION: ECOG PERFORMANCE STATUS: 1 - Symptomatic but completely ambulatory  Vitals:   09/12/21 0843  BP: 110/69  Pulse: 84  Temp: 98.6 F (37 C)  SpO2: 99%   Filed Weights   09/12/21 0843  Weight: 103 lb (46.7  kg)   Approximately 2-3 cm lymph node noted in the right submandibular region.  Physical Exam Vitals and nursing note reviewed.  HENT:     Head: Normocephalic and atraumatic.     Mouth/Throat:     Pharynx: Oropharynx is clear.  Eyes:     Extraocular Movements: Extraocular movements intact.     Pupils: Pupils are equal, round, and reactive to light.  Cardiovascular:     Rate and Rhythm: Normal rate and regular rhythm.  Pulmonary:     Comments: Decreased breath sounds bilaterally.  Abdominal:     Palpations: Abdomen is soft.  Musculoskeletal:        General: Normal range of motion.     Cervical back: Normal range of motion.  Skin:    General: Skin is warm.  Neurological:     General: No focal deficit present.     Mental Status: She is alert and oriented to person, place, and time.  Psychiatric:        Behavior: Behavior normal.        Judgment: Judgment normal.      LABORATORY DATA:  I have reviewed the data as listed Lab Results  Component Value Date   WBC 12.9 (H) 09/12/2021   HGB 10.7 (L) 09/12/2021   HCT 31.9 (L) 09/12/2021   MCV 85.5 09/12/2021   PLT 379 09/12/2021   Recent Labs    08/26/21 0832 09/05/21 0815 09/12/21 0756  NA 125* 123* 121*  K 3.5 4.1 4.0  CL 91* 88* 87*  CO2 '25 26 27  '$ GLUCOSE 210* 138* 144*  BUN 10 7* 12  CREATININE 0.51 0.41* 0.43*  CALCIUM 9.2 9.0 9.1  GFRNONAA >60 >60 >60  PROT 7.4 7.5 7.2  ALBUMIN 3.8 3.4* 3.2*  AST '21 16 15  '$ ALT '16 19 18  '$ ALKPHOS 86 100 86  BILITOT 0.4 0.1* 0.2*     RADIOGRAPHIC STUDIES: I have personally reviewed the radiological images as listed and agreed with the findings in the report. IR IMAGING GUIDED PORT INSERTION  Result Date: 08/14/2021 INDICATION: Nasopharyngeal carcinoma EXAM: IMPLANTED PORT A CATH PLACEMENT WITH ULTRASOUND AND FLUOROSCOPIC GUIDANCE MEDICATIONS: None ANESTHESIA/SEDATION: Moderate (conscious) sedation was employed during this procedure. A total of  Versed 1.5 mg and Fentanyl  75 mcg was administered intravenously by the radiology nurse. Total intra-service moderate Sedation Time: 16 minutes. The patient's level of consciousness and vital signs were monitored continuously by radiology nursing throughout the procedure under my direct supervision. FLUOROSCOPY: Radiation Exposure Index (as provided by the fluoroscopic device): 2.1 mGy Kerma COMPLICATIONS: None immediate. PROCEDURE: The procedure, risks, benefits, and alternatives were explained to the patient. Questions regarding the procedure were encouraged and answered. The patient understands and consents to the procedure. A timeout was performed prior to the initiation of the procedure. Patient positioned supine on the angiography table. Right neck and anterior upper chest prepped and draped in the usual sterile fashion. All elements of maximal sterile barrier were utilized including, cap, mask, sterile gown, sterile gloves, large sterile drape, hand scrubbing and 2% Chlorhexidine for skin cleaning. The right internal jugular vein was evaluated with ultrasound and shown to be patent. A permanent ultrasound image was obtained and placed in the patient's medical record. Local anesthesia was provided with 1% lidocaine with epinephrine. Using sterile gel and a sterile probe cover, the right internal jugular vein was entered with a 21 ga needle during real time ultrasound guidance. 0.018 inch guidewire placed and 21 ga needle exchanged for transitional dilator set. Utilizing fluoroscopy, 0.035 inch guidewire advanced through the needle without difficulty. Attention then turned to the right anterior upper chest. Following local lidocaine administration, a port pocket was created. The catheter was connected to the port and brought from the pocket to the venotomy site through a subcutaneous tunnel. The catheter was cut to size and inserted through the peel-away sheath. The catheter tip was positioned at the cavoatrial junction using  fluoroscopic guidance. The port aspirated and flushed well. The port pocket was closed with deep and superficial absorbable suture. The port pocket incision and venotomy sites were also sealed with Dermabond. IMPRESSION: Successful placement of a right internal jugular approach power injectable Port-A-Cath. The catheter is ready for immediate use. Electronically Signed   By: Miachel Roux M.D.   On: 08/14/2021 14:05    ASSESSMENT & PLAN:   No problem-specific Assessment & Plan notes found for this encounter.  All questions were answered. The patient knows to call the clinic with any problems, questions or concerns.   Vickie Sickle, MD 09/12/2021 9:01 AM

## 2021-09-12 NOTE — Assessment & Plan Note (Addendum)
#   April 26th, 2023-  NASOPHARYNGEAL SQUAMOUS EBV-NEGATIVE. [Dr.Bennett: ]; STAGE II [T2 N1]; #  PET scan May 5th, 2023-  Posterior nasopharyngeal mass maximum SUV 12.7, compatible with malignancy; Hypermetabolic right level IIb and right level IV lymph nodes compatible with malignant spread. Discussed at the tumor conference.  Discussed with Dr. Katina Dung. Vickie Caldwell.  On definitive chemoradiation cisplatin followed by  from adjuvant chemotherapy given the nasopharyngeal disease.  Discussed that the goal of treatment is cure.  Patient on chemoradiation -concurrent radiation-  [5/25 to july14th*].  #Proceed with cycle 1 of cisplatin today. Labs today reviewed;  acceptable for treatment today.   # Head aches; on hydrocodne q 6hours- not helping..; Zofran-    # Swallowing- worse;  S/p  with [facility] nutrition [ensure max- strwaberry- thin 30 gm/day; 1/day- ]  # Smoking: 1ppd/day; in process of quitting smoking.   # Chronic Hyponatremia: ? 120s- worse- -check labs. ?  As per patient 1 functioning kidney; awaiting evaluation with nephrology.  # Depression/anxiety- [Dr.macheno-]; valium-   # IV access/Mediport-functional.   # DISPOSITION: # chemo today # M/Wed/Friday- 1 lit IVFs [corrdiante with NP-MD appts] # follow up in 1 week- NP; labs- cbc/cmp;mag; cisplatin  # follow up in 2 weeks- MD; labs- cbc/cmp; mag; Csiplatin weekly- Dr.B

## 2021-09-12 NOTE — Progress Notes (Unsigned)
Pt very depressed, crying. She states she usually can get over it in a couple of days but feels like she may have a nervous breakdown like she did when she was 28.  Pulm took her off compazine and rx'd phenergan dure to the zofran/compazine combo causing headaches.

## 2021-09-12 NOTE — Patient Instructions (Signed)
[facility] AT [place]  Discharge Instructions: Thank you for choosing [facility] to provide your oncology and hematology care.  If you have a lab appointment with the [facility], please go directly to the [facility] and check in at the registration area.  Wear comfortable clothing and clothing appropriate for easy access to any Portacath or PICC line.   We strive to give you quality time with your provider. You may need to reschedule your appointment if you arrive late (15 or more minutes).  Arriving late affects you and other patients whose appointments are after yours.  Also, if you miss three or more appointments without notifying the office, you may be dismissed from the clinic at the provider's discretion.      For prescription refill requests, have your pharmacy contact our office and allow 72 hours for refills to be completed.    Today you received the following chemotherapy and/or immunotherapy agents: CISPLATIN   To help prevent nausea and vomiting after your treatment, we encourage you to take your nausea medication as directed.  BELOW ARE SYMPTOMS THAT SHOULD BE REPORTED IMMEDIATELY: *FEVER GREATER THAN 100.4 F (38 C) OR HIGHER *CHILLS OR SWEATING *NAUSEA AND VOMITING THAT IS NOT CONTROLLED WITH YOUR NAUSEA MEDICATION *UNUSUAL SHORTNESS OF BREATH *UNUSUAL BRUISING OR BLEEDING *URINARY PROBLEMS (pain or burning when urinating, or frequent urination) *BOWEL PROBLEMS (unusual diarrhea, constipation, pain near the anus) TENDERNESS IN MOUTH AND THROAT WITH OR WITHOUT PRESENCE OF ULCERS (sore throat, sores in mouth, or a toothache) UNUSUAL RASH, SWELLING OR PAIN  UNUSUAL VAGINAL DISCHARGE OR ITCHING   Items with * indicate a potential emergency and should be followed up as soon as possible or go to the Emergency Department if any problems should occur.  Please show the CHEMOTHERAPY ALERT CARD or IMMUNOTHERAPY ALERT CARD at check-in to the  Emergency Department and triage nurse.  Should you have questions after your visit or need to cancel or reschedule your appointment, please contact [facility] [place] [facility] [place]  [phone] and follow the prompts.  Office hours are 8:00 a.m. to 4:30 p.m. Monday - Friday. Please note that voicemails left after 4:00 p.m. may not be returned until the following business day.  We are closed weekends and major holidays. You have access to a nurse at all times for urgent questions. Please call the main number to the clinic [phone] and follow the prompts.  For any non-urgent questions, you may also contact your provider using MyChart. We now offer e-Visits for anyone 40 and older to request care online for non-urgent symptoms. For details visit [email or web address].   Also download the MyChart app! Go to the app store, search "MyChart", open the app, select [facility], and log in with your MyChart username and password.  Due to Covid, a mask is required upon entering the hospital/clinic. If you do not have a mask, one will be given to you upon arrival. For doctor visits, patients may have 1 support person aged 32 or older with them. For treatment visits, patients cannot have anyone with them due to current Covid guidelines and our immunocompromised population.

## 2021-09-14 ENCOUNTER — Encounter: Payer: Self-pay | Admitting: Internal Medicine

## 2021-09-15 ENCOUNTER — Other Ambulatory Visit: Payer: Self-pay

## 2021-09-15 ENCOUNTER — Inpatient Hospital Stay: Payer: Medicare HMO

## 2021-09-15 ENCOUNTER — Ambulatory Visit
Admission: RE | Admit: 2021-09-15 | Discharge: 2021-09-15 | Disposition: A | Discharge status: Home / Self Care | Payer: Medicare HMO | Source: Ambulatory Visit | Attending: Radiation Oncology | Admitting: Radiation Oncology

## 2021-09-15 DIAGNOSIS — Z510 Encounter for antineoplastic radiation therapy: Secondary | ICD-10-CM | POA: Diagnosis not present

## 2021-09-15 DIAGNOSIS — C119 Malignant neoplasm of nasopharynx, unspecified: Secondary | ICD-10-CM

## 2021-09-15 LAB — RAD ONC ARIA SESSION SUMMARY
Course Elapsed Days: 18
Plan Fractions Treated to Date: 11
Plan Prescribed Dose Per Fraction: 2 Gy
Plan Total Fractions Prescribed: 35
Plan Total Prescribed Dose: 70 Gy
Reference Point Dosage Given to Date: 22 Gy
Reference Point Session Dosage Given: 2 Gy
Session Number: 11

## 2021-09-16 ENCOUNTER — Ambulatory Visit
Admission: RE | Admit: 2021-09-16 | Discharge: 2021-09-16 | Disposition: A | Discharge status: Home / Self Care | Payer: Medicare HMO | Source: Ambulatory Visit | Attending: Radiation Oncology | Admitting: Radiation Oncology

## 2021-09-16 ENCOUNTER — Other Ambulatory Visit: Payer: Self-pay

## 2021-09-16 DIAGNOSIS — Z510 Encounter for antineoplastic radiation therapy: Secondary | ICD-10-CM | POA: Diagnosis not present

## 2021-09-16 LAB — RAD ONC ARIA SESSION SUMMARY
Course Elapsed Days: 19
Plan Fractions Treated to Date: 12
Plan Prescribed Dose Per Fraction: 2 Gy
Plan Total Fractions Prescribed: 35
Plan Total Prescribed Dose: 70 Gy
Reference Point Dosage Given to Date: 24 Gy
Reference Point Session Dosage Given: 2 Gy
Session Number: 12

## 2021-09-17 ENCOUNTER — Inpatient Hospital Stay: Payer: Medicare HMO

## 2021-09-17 ENCOUNTER — Other Ambulatory Visit: Payer: Self-pay

## 2021-09-17 ENCOUNTER — Ambulatory Visit
Admission: RE | Admit: 2021-09-17 | Discharge: 2021-09-17 | Disposition: A | Discharge status: Home / Self Care | Payer: Medicare HMO | Source: Ambulatory Visit | Attending: Radiation Oncology | Admitting: Radiation Oncology

## 2021-09-17 DIAGNOSIS — Z510 Encounter for antineoplastic radiation therapy: Secondary | ICD-10-CM | POA: Diagnosis not present

## 2021-09-17 LAB — RAD ONC ARIA SESSION SUMMARY
Course Elapsed Days: 20
Plan Fractions Treated to Date: 13
Plan Prescribed Dose Per Fraction: 2 Gy
Plan Total Fractions Prescribed: 35
Plan Total Prescribed Dose: 70 Gy
Reference Point Dosage Given to Date: 26 Gy
Reference Point Session Dosage Given: 2 Gy
Session Number: 13

## 2021-09-18 ENCOUNTER — Ambulatory Visit
Admission: RE | Admit: 2021-09-18 | Discharge: 2021-09-18 | Disposition: A | Discharge status: Home / Self Care | Payer: Medicare HMO | Source: Ambulatory Visit | Attending: Radiation Oncology | Admitting: Radiation Oncology

## 2021-09-18 ENCOUNTER — Inpatient Hospital Stay (HOSPITAL_BASED_OUTPATIENT_CLINIC_OR_DEPARTMENT_OTHER): Payer: Medicare HMO | Admitting: Nurse Practitioner

## 2021-09-18 ENCOUNTER — Inpatient Hospital Stay: Payer: Medicare HMO

## 2021-09-18 ENCOUNTER — Other Ambulatory Visit: Payer: Self-pay

## 2021-09-18 VITALS — BP 142/79 | HR 83 | Temp 98.5°F | Resp 17 | Wt 101.0 lb

## 2021-09-18 DIAGNOSIS — T402X5A Adverse effect of other opioids, initial encounter: Secondary | ICD-10-CM

## 2021-09-18 DIAGNOSIS — C119 Malignant neoplasm of nasopharynx, unspecified: Secondary | ICD-10-CM

## 2021-09-18 DIAGNOSIS — Z5111 Encounter for antineoplastic chemotherapy: Secondary | ICD-10-CM | POA: Diagnosis not present

## 2021-09-18 DIAGNOSIS — E871 Hypo-osmolality and hyponatremia: Secondary | ICD-10-CM

## 2021-09-18 DIAGNOSIS — Z510 Encounter for antineoplastic radiation therapy: Secondary | ICD-10-CM | POA: Diagnosis not present

## 2021-09-18 DIAGNOSIS — K5903 Drug induced constipation: Secondary | ICD-10-CM | POA: Diagnosis not present

## 2021-09-18 LAB — COMPREHENSIVE METABOLIC PANEL
ALT: 18 U/L (ref 0–44)
AST: 16 U/L (ref 15–41)
Albumin: 3.2 g/dL — ABNORMAL LOW (ref 3.5–5.0)
Alkaline Phosphatase: 94 U/L (ref 38–126)
Anion gap: 11 (ref 5–15)
BUN: 10 mg/dL (ref 8–23)
CO2: 26 mmol/L (ref 22–32)
Calcium: 9.1 mg/dL (ref 8.9–10.3)
Chloride: 87 mmol/L — ABNORMAL LOW (ref 98–111)
Creatinine, Ser: 0.35 mg/dL — ABNORMAL LOW (ref 0.44–1.00)
GFR, Estimated: >60 mL/min (ref >60)
Glucose, Bld: 112 mg/dL — ABNORMAL HIGH (ref 70–99)
Potassium: 3.6 mmol/L (ref 3.5–5.1)
Sodium: 124 mmol/L — ABNORMAL LOW (ref 135–145)
Total Bilirubin: 0.5 mg/dL (ref 0.3–1.2)
Total Protein: 7.1 g/dL (ref 6.5–8.1)

## 2021-09-18 LAB — RAD ONC ARIA SESSION SUMMARY
Course Elapsed Days: 21
Plan Fractions Treated to Date: 14
Plan Prescribed Dose Per Fraction: 2 Gy
Plan Total Fractions Prescribed: 35
Plan Total Prescribed Dose: 70 Gy
Reference Point Dosage Given to Date: 28 Gy
Reference Point Session Dosage Given: 2 Gy
Session Number: 14

## 2021-09-18 LAB — CBC WITH DIFFERENTIAL/PLATELET
Abs Immature Granulocytes: 0.09 10*3/uL — ABNORMAL HIGH (ref 0.00–0.07)
Basophils Absolute: 0 10*3/uL (ref 0.0–0.1)
Basophils Relative: 0 %
Eosinophils Absolute: 0.1 10*3/uL (ref 0.0–0.5)
Eosinophils Relative: 1 %
HCT: 31.3 % — ABNORMAL LOW (ref 36.0–46.0)
Hemoglobin: 10.5 g/dL — ABNORMAL LOW (ref 12.0–15.0)
Immature Granulocytes: 1 %
Lymphocytes Relative: 5 %
Lymphs Abs: 0.5 10*3/uL — ABNORMAL LOW (ref 0.7–4.0)
MCH: 28.9 pg (ref 26.0–34.0)
MCHC: 33.5 g/dL (ref 30.0–36.0)
MCV: 86.2 fL (ref 80.0–100.0)
Monocytes Absolute: 0.9 10*3/uL (ref 0.1–1.0)
Monocytes Relative: 9 %
Neutro Abs: 8.3 10*3/uL — ABNORMAL HIGH (ref 1.7–7.7)
Neutrophils Relative %: 84 %
Platelets: 319 10*3/uL (ref 150–400)
RBC: 3.63 MIL/uL — ABNORMAL LOW (ref 3.87–5.11)
RDW: 12 % (ref 11.5–15.5)
WBC: 9.8 10*3/uL (ref 4.0–10.5)
nRBC: 0 % (ref 0.0–0.2)

## 2021-09-18 LAB — MAGNESIUM: Magnesium: 1.9 mg/dL (ref 1.7–2.4)

## 2021-09-18 MED ORDER — HEPARIN SOD (PORK) LOCK FLUSH 100 UNIT/ML IV SOLN
500.0000 [IU] | Freq: Once | INTRAVENOUS | Status: AC
  Administered 2021-09-18: 500 [IU] via INTRAVENOUS
  Filled 2021-09-18: qty 5

## 2021-09-18 MED FILL — Fosaprepitant Dimeglumine For IV Infusion 150 MG (Base Eq): INTRAVENOUS | Qty: 5 | Status: AC

## 2021-09-18 MED FILL — Dexamethasone Sodium Phosphate Inj 100 MG/10ML: INTRAMUSCULAR | Qty: 1 | Status: AC

## 2021-09-18 NOTE — Progress Notes (Signed)
Pt was seen and assessed in radiation this am. Reports feeling fatigued today.

## 2021-09-18 NOTE — Progress Notes (Signed)
Patient requested port to be deaccessed. Patient discharged, stable

## 2021-09-18 NOTE — Progress Notes (Signed)
[place] NOTE  Patient Care Team: Revelo, Elyse Jarvis, MD as PCP - General (Family Medicine) Cammie Sickle, MD as Consulting Physician (Oncology)  CHIEF COMPLAINTS/PURPOSE OF CONSULTATION: head and neck cancer  #  Oncology History Overview Note  # April 26th, 2023-  NASOPHARYNGEAL MASS; ENDOSCOPIC BIOPSY:  - POORLY DIFFERENTIATED CARCINOMA, WITH SQUAMOUS DIFFERENTIATION AND  KERATINIZATION. EBV-NEGATIVE. [Dr.Bennett: ]  Comment:  Immunohistochemical studies show tumor cells to be strongly and  diffusely positive for CK5/6 and p40, and negative for S100, CD45 (LCA),  desmin, and p16  #  PET sacn May 5th, 2023-  Posterior nasopharyngeal mass maximum SUV 12.7, compatible with malignancy; Hypermetabolic right level IIb and right level IV lymph nodes compatible with malignant spread. 3. A 4 mm in short axis left level IIb lymph node has a maximum SUV of 2.1 which is about at the level of the blood pool, probably not involved. 4. Other imaging findings of potential clinical significance: Aortic Atherosclerosis (ICD10-I70.0). Coronary and systemic atherosclerosis. Emphysema (ICD10-J43.9). Sigmoid colon diverticulosis. Scoliosis.     Electronically Signed   By: Van Clines M.D.   On: 08/08/2021 10:56  # STAGE II nasopharyngeal carcinoma [Dr.Bennett];   # MAY 23rd- cisplatin weekly-RT; 5/25- RT   Nasopharyngeal carcinoma ([place])  08/11/2021 Initial Diagnosis   Nasopharyngeal carcinoma ([place])   08/11/2021 Cancer Staging   Staging form: Pharynx - Nasopharynx, AJCC 8th Edition - Clinical: Stage II (cT2, cN1, cM0) - Signed by Cammie Sickle, MD on 08/11/2021 Stage prefix: Initial diagnosis   08/26/2021 -  Chemotherapy   Patient is on Treatment Plan : HEAD/NECK Cisplatin q7d + XRT x 6 Cycles / Cisplatin D1 + 5FU IVCI D1-4 q28d x 3 Cycles        HISTORY OF PRESENTING ILLNESS: Alone.  Ambulating independently.  Vickie Caldwell 71 y.o.   female diagnosed with nasopharyngeal carcinoma; chronic hyponatremia, currently on weekly cisplatin with radiation who returns to clinic for consideration of continuation of cisplatin chemotherapy.She sees nephrology later today. Generally feels at baseline. Complains of constipation. Has taken senna and miralax intermittently. No nausea, vomiting, or pain.   Review of Systems  Constitutional:  Positive for malaise/fatigue. Negative for chills, diaphoresis and fever.  HENT:  Negative for nosebleeds and sore throat.   Eyes:  Negative for double vision.  Respiratory:  Negative for cough, hemoptysis, sputum production, shortness of breath and wheezing.   Cardiovascular:  Negative for chest pain, palpitations, orthopnea and leg swelling.  Gastrointestinal:  Positive for constipation. Negative for abdominal pain, blood in stool, diarrhea, heartburn, melena, nausea and vomiting.  Genitourinary:  Negative for dysuria, frequency and urgency.  Musculoskeletal:  Positive for back pain and joint pain.  Skin: Negative.  Negative for itching and rash.  Neurological:  Positive for headaches. Negative for dizziness, tingling, focal weakness and weakness.  Endo/Heme/Allergies:  Does not bruise/bleed easily.  Psychiatric/Behavioral:  Positive for depression. The patient is not nervous/anxious and does not have insomnia.      MEDICAL HISTORY:  Past Medical History:  Diagnosis Date   Asthma    COPD (chronic obstructive pulmonary disease) (HCC)    Dysrhythmia    GERD (gastroesophageal reflux disease)    History of kidney stones    Hypertension    Osteoarthritis    TIA (transient ischemic attack) 2011   No Deficits   Vertigo     SURGICAL HISTORY: Past Surgical History:  Procedure Laterality Date   ABDOMINAL HYSTERECTOMY     BREAST  BIOPSY Left 1980's   neg. Pt states punch biopsy at the nipple   IR IMAGING GUIDED PORT INSERTION  08/14/2021   LYMPH NODE DISSECTION Right    neck   MANDIBLE SURGERY      Sagittal split   NASAL ENDOSCOPY Bilateral 07/23/2021   Procedure: NASAL ENDOSCOPY WITH BIOPSY OF NASOPHARYNGEAL MASS;  Surgeon: Clyde Canterbury, MD;  Location: [facility] ORS;  Service: ENT;  Laterality: Bilateral;   THROAT SURGERY     Vocal cord polyps "burned"   TUBAL LIGATION     WISDOM TOOTH EXTRACTION      SOCIAL HISTORY: Social History   Socioeconomic History   Marital status: Single    Spouse name: Not on file   Number of children: Not on file   Years of education: Not on file   Highest education level: Not on file  Occupational History   Not on file  Tobacco Use   Smoking status: Every Day    Packs/day: 1.00    Years: 51.00    Total pack years: 51.00    Types: Cigarettes   Smokeless tobacco: Never   Tobacco comments:    Started smoking around age 42  Vaping Use   Vaping Use: Never used  Substance and Sexual Activity   Alcohol use: Yes    Alcohol/week: 1.0 standard drink of alcohol    Types: 1 Cans of beer per week    Comment: occasional   Drug use: Never   Sexual activity: Not Currently    Birth control/protection: None  Other Topics Concern   Not on file  Social History Narrative   Smoker; sometimes beer/wine; used to work for Therapist, 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 Lives in [place]. With daughter-ZES//brother. From [place].    Social Determinants of Health   Financial Resource Strain: Not on file  Food Insecurity: Not on file  Transportation Needs: Not on file  Physical Activity: Not on file  Stress: Not on file  Social Connections: Not on file  Intimate Partner Violence: Not on file    FAMILY HISTORY: Family History  Problem Relation Age of Onset   Coronary artery disease Mother    Cancer Father 57       Oral   Cancer Brother 79       testicular   Prostate cancer Brother    Heart failure Brother    Breast cancer Neg Hx     ALLERGIES:  is allergic to codeine and penicillins.  MEDICATIONS:  Current Outpatient Medications  Medication Sig  Dispense Refill   acetaminophen (TYLENOL) 325 MG tablet Take 650 mg by mouth every 6 (six) hours as needed.     albuterol (VENTOLIN HFA) 108 (90 Base) MCG/ACT inhaler Inhale into the lungs every 6 (six) hours as needed for wheezing or shortness of breath.     amLODipine (NORVASC) 10 MG tablet Take 10 mg by mouth daily.     ASPIRIN 81 PO Take by mouth daily.     Cholecalciferol (VITAMIN D3 PO) Take by mouth daily.     diazepam (VALIUM) 5 MG tablet Take 1 tablet (5 mg total) by mouth every 12 (twelve) hours as needed. 30 tablet 0   famotidine (PEPCID) 10 MG tablet Take 20 mg by mouth daily.     fluticasone (FLONASE) 50 MCG/ACT nasal spray Place into both nostrils daily.     HYDROcodone-acetaminophen (NORCO) 10-325 MG tablet Take 1 tablet by mouth every 6 (six) hours as needed. 45 tablet 0   lidocaine-prilocaine (EMLA)  cream Apply on the port. 30 -45 min  prior to port access. 30 g 3   metoprolol succinate (TOPROL-XL) 100 MG 24 hr tablet Take 100 mg by mouth daily. Take with or immediately following a meal.     montelukast (SINGULAIR) 10 MG tablet Take 10 mg by mouth daily.     morphine (MS CONTIN) 30 MG 12 hr tablet Take 1 tablet (30 mg total) by mouth every 12 (twelve) hours. 60 tablet 0   ondansetron (ZOFRAN) 8 MG tablet One pill every 8 hours as needed for nausea/vomitting. 40 tablet 1   promethazine (PHENERGAN) 25 MG tablet Take 0.5 tablets (12.5 mg total) by mouth every 8 (eight) hours as needed for refractory nausea / vomiting. 30 tablet 0   umeclidinium bromide (INCRUSE ELLIPTA) 62.5 MCG/ACT AEPB Inhale 1 puff into the lungs daily.     No current facility-administered medications for this visit.     PHYSICAL EXAMINATION: ECOG PERFORMANCE STATUS: 1 - Symptomatic but completely ambulatory  Vitals:   09/18/21 1143  BP: (!) 142/79  Pulse: 83  Resp: 17  Temp: 98.5 F (36.9 C)  SpO2: 99%   Filed Weights   09/18/21 1143  Weight: 101 lb (45.8 kg)   Physical Exam Vitals and nursing  note reviewed.  HENT:     Head: Normocephalic and atraumatic.     Mouth/Throat:     Pharynx: Oropharynx is clear.  Eyes:     Extraocular Movements: Extraocular movements intact.     Pupils: Pupils are equal, round, and reactive to light.  Cardiovascular:     Rate and Rhythm: Normal rate and regular rhythm.  Pulmonary:     Comments: Decreased breath sounds bilaterally.  Abdominal:     Palpations: Abdomen is soft.  Musculoskeletal:        General: Normal range of motion.     Cervical back: Normal range of motion.  Skin:    General: Skin is warm.  Neurological:     General: No focal deficit present.     Mental Status: She is alert and oriented to person, place, and time.  Psychiatric:        Behavior: Behavior normal.        Judgment: Judgment normal.    LABORATORY DATA:  I have reviewed the data as listed Lab Results  Component Value Date   WBC 9.8 09/18/2021   HGB 10.5 (L) 09/18/2021   HCT 31.3 (L) 09/18/2021   MCV 86.2 09/18/2021   PLT 319 09/18/2021   Recent Labs    09/05/21 0815 09/12/21 0756 09/18/21 1011  NA 123* 121* 124*  K 4.1 4.0 3.6  CL 88* 87* 87*  CO2 '26 27 26  '$ GLUCOSE 138* 144* 112*  BUN 7* 12 10  CREATININE 0.41* 0.43* 0.35*  CALCIUM 9.0 9.1 9.1  GFRNONAA >60 >60 >60  PROT 7.5 7.2 7.1  ALBUMIN 3.4* 3.2* 3.2*  AST '16 15 16  '$ ALT '19 18 18  '$ ALKPHOS 100 86 94  BILITOT 0.1* 0.2* 0.5    RADIOGRAPHIC STUDIES: I have personally reviewed the radiological images as listed and agreed with the findings in the report. No results found.  ASSESSMENT & PLAN:   No problem-specific Assessment & Plan notes found for this encounter.  Nasopharyngeal carcinoma [facility]) # April 26th, 2023-  NASOPHARYNGEAL SQUAMOUS EBV-NEGATIVE. [Dr.Bennett: ]; STAGE II [T2 N1]; #  PET scan May 5th, 2023-  Posterior nasopharyngeal mass maximum SUV 12.7, compatible with malignancy; Hypermetabolic right level IIb and right  level IV lymph nodes  Currently on definitive chemoradiation  cisplatin followed by adjuvant chemotherapy. Given with curative intent. Currently on patient on weekly cisplatin-with concurrent radiation  [5/25 to july14th*].   # Labs reviewed and acceptable for treatment. Proceed with cycle 4 of cisplatin tomorrow as scheduled.   # hyponatremia- chronic. 120s. Seeing Nephrology later today. She has been receiving IVF 3 times a week. Per patient, 1 functioning kidney.   # Headaches- on hydrocodone and ms contin 30 mg BID. Stable and controlled.  # Constipation- likely d/t opioids. Increase senna to 1-2 tablets 1-2 times a day in addition to miralax 1 cap in glass of liquid 3 times a day. If unrelieved consider OTC milk of magnesia, mag citrate vs prescription lactulose.   # Malnutrition- she sees [employer]. If worsening weight loss consider PEG placement.    # Smoking: 1ppd/day; in process of quitting smoking.   # Depression/anxiety- [Dr.macheno-]; valium prn.    # IV access/Mediport-functional.    # DISPOSITION: Deaccess  RTC tomorrow for port/lab, Cisplatin Follow up with Dr. Rogue Bussing as scheduled- la  All questions were answered. The patient knows to call the clinic with any problems, questions or concerns.  Verlon Au, NP 09/18/2021

## 2021-09-19 ENCOUNTER — Other Ambulatory Visit: Payer: Self-pay

## 2021-09-19 ENCOUNTER — Inpatient Hospital Stay: Payer: Medicare HMO

## 2021-09-19 ENCOUNTER — Encounter: Payer: Self-pay | Admitting: Internal Medicine

## 2021-09-19 ENCOUNTER — Other Ambulatory Visit: Payer: Self-pay | Admitting: Nurse Practitioner

## 2021-09-19 ENCOUNTER — Ambulatory Visit
Admission: RE | Admit: 2021-09-19 | Discharge: 2021-09-19 | Disposition: A | Discharge status: Home / Self Care | Payer: Medicare HMO | Source: Ambulatory Visit | Attending: Radiation Oncology | Admitting: Radiation Oncology

## 2021-09-19 VITALS — BP 118/71 | HR 95 | Temp 98.9°F | Resp 18

## 2021-09-19 DIAGNOSIS — C119 Malignant neoplasm of nasopharynx, unspecified: Secondary | ICD-10-CM

## 2021-09-19 DIAGNOSIS — Z510 Encounter for antineoplastic radiation therapy: Secondary | ICD-10-CM | POA: Diagnosis not present

## 2021-09-19 DIAGNOSIS — Z5111 Encounter for antineoplastic chemotherapy: Secondary | ICD-10-CM | POA: Diagnosis not present

## 2021-09-19 LAB — RAD ONC ARIA SESSION SUMMARY
Course Elapsed Days: 22
Plan Fractions Treated to Date: 15
Plan Prescribed Dose Per Fraction: 2 Gy
Plan Total Fractions Prescribed: 35
Plan Total Prescribed Dose: 70 Gy
Reference Point Dosage Given to Date: 30 Gy
Reference Point Session Dosage Given: 2 Gy
Session Number: 15

## 2021-09-19 MED ORDER — SODIUM CHLORIDE 0.9 % IV SOLN
150.0000 mg | Freq: Once | INTRAVENOUS | Status: AC
  Administered 2021-09-19: 150 mg via INTRAVENOUS
  Filled 2021-09-19: qty 150

## 2021-09-19 MED ORDER — MAGNESIUM SULFATE 2 GM/50ML IV SOLN
2.0000 g | Freq: Once | INTRAVENOUS | Status: AC
  Administered 2021-09-19: 2 g via INTRAVENOUS
  Filled 2021-09-19: qty 50

## 2021-09-19 MED ORDER — LACTULOSE 10 GM/15ML PO SOLN
10.0000 g | Freq: Two times a day (BID) | ORAL | 0 refills | Status: AC | PRN
Start: 2021-09-19 — End: ?

## 2021-09-19 MED ORDER — SODIUM CHLORIDE 0.9% FLUSH
10.0000 mL | INTRAVENOUS | Status: DC | PRN
  Filled 2021-09-19: qty 10

## 2021-09-19 MED ORDER — POTASSIUM CHLORIDE IN NACL 20-0.9 MEQ/L-% IV SOLN
Freq: Once | INTRAVENOUS | Status: AC
  Filled 2021-09-19: qty 1000

## 2021-09-19 MED ORDER — PALONOSETRON HCL INJECTION 0.25 MG/5ML
0.2500 mg | Freq: Once | INTRAVENOUS | Status: AC
  Administered 2021-09-19: 0.25 mg via INTRAVENOUS
  Filled 2021-09-19: qty 5

## 2021-09-19 MED ORDER — SODIUM CHLORIDE 0.9 % IV SOLN
40.0000 mg/m2 | Freq: Once | INTRAVENOUS | Status: AC
  Administered 2021-09-19: 56 mg via INTRAVENOUS
  Filled 2021-09-19: qty 56

## 2021-09-19 MED ORDER — HEPARIN SOD (PORK) LOCK FLUSH 100 UNIT/ML IV SOLN
500.0000 [IU] | Freq: Once | INTRAVENOUS | Status: AC | PRN
  Administered 2021-09-19: 500 [IU]
  Filled 2021-09-19: qty 5

## 2021-09-19 MED ORDER — SODIUM CHLORIDE 0.9 % IV SOLN
Freq: Once | INTRAVENOUS | Status: AC
  Filled 2021-09-19: qty 250

## 2021-09-19 MED ORDER — SODIUM CHLORIDE 0.9 % IV SOLN
10.0000 mg | Freq: Once | INTRAVENOUS | Status: AC
  Administered 2021-09-19: 10 mg via INTRAVENOUS
  Filled 2021-09-19: qty 10

## 2021-09-19 NOTE — Progress Notes (Signed)
Nutrition Follow-up:  Patient with SCC of nasopharynx.  Patient receiving concurrent chemotherapy and radiation.   Met with patient during infusion.  Says that she is swallowing better.  Yesterday able to drink 3 shakes (some were 150 calorie).  Ate 2 scrambled eggs and 2 strips bacon.  Also had canned tuna with mayo packed in oil sandwich.  Says that she ordered the glutasolve and 350 calorie shakes.  Denies nausea as taking zofran regularly.    Says that she has not had a bowel movement in about a week.  Has been taking 2 stool softners BID. Added miralax this am  Nephrology notes reviewed.    Medications: reviewed  Labs: Na 124, glucose 112  Anthropometrics:   Weight 101 lb on 6/15  104 lb 8 oz on 6/2 106 lb on 5/23   NUTRITION DIAGNOSIS: Predicted subotimal energy intake continues with treatment   INTERVENTION:  Encouraged patient to continue high calorie shakes. Discussed ways of adding calories to food Spoke with Vickie Purpura, NP regarding bowel regimen.  NP adding lactulose.  Discussed with patient.      MONITORING, EVALUATION, GOAL: weight trends, intake   NEXT VISIT: Friday, June 23rd during infusion  Vickie Caldwell, [place], [place] Registered Dietitian [phone]

## 2021-09-19 NOTE — Progress Notes (Signed)
Patient's constipation unrelieved by senna and miralax. Prescription for lactulose sent to pharmacy.

## 2021-09-22 ENCOUNTER — Other Ambulatory Visit: Payer: Self-pay

## 2021-09-22 ENCOUNTER — Ambulatory Visit
Admission: RE | Admit: 2021-09-22 | Discharge: 2021-09-22 | Disposition: A | Discharge status: Home / Self Care | Payer: Medicare HMO | Source: Ambulatory Visit | Attending: Radiation Oncology | Admitting: Radiation Oncology

## 2021-09-22 DIAGNOSIS — Z510 Encounter for antineoplastic radiation therapy: Secondary | ICD-10-CM | POA: Diagnosis not present

## 2021-09-22 LAB — RAD ONC ARIA SESSION SUMMARY
Course Elapsed Days: 25
Plan Fractions Treated to Date: 16
Plan Prescribed Dose Per Fraction: 2 Gy
Plan Total Fractions Prescribed: 35
Plan Total Prescribed Dose: 70 Gy
Reference Point Dosage Given to Date: 32 Gy
Reference Point Session Dosage Given: 2 Gy
Session Number: 16

## 2021-09-23 ENCOUNTER — Ambulatory Visit
Admission: RE | Admit: 2021-09-23 | Discharge: 2021-09-23 | Disposition: A | Discharge status: Home / Self Care | Payer: Medicare HMO | Source: Ambulatory Visit | Attending: Radiation Oncology | Admitting: Radiation Oncology

## 2021-09-23 ENCOUNTER — Other Ambulatory Visit: Payer: Self-pay

## 2021-09-23 DIAGNOSIS — Z510 Encounter for antineoplastic radiation therapy: Secondary | ICD-10-CM | POA: Diagnosis not present

## 2021-09-23 LAB — RAD ONC ARIA SESSION SUMMARY
Course Elapsed Days: 26
Plan Fractions Treated to Date: 17
Plan Prescribed Dose Per Fraction: 2 Gy
Plan Total Fractions Prescribed: 35
Plan Total Prescribed Dose: 70 Gy
Reference Point Dosage Given to Date: 34 Gy
Reference Point Session Dosage Given: 2 Gy
Session Number: 17

## 2021-09-24 ENCOUNTER — Other Ambulatory Visit: Payer: Self-pay

## 2021-09-24 ENCOUNTER — Ambulatory Visit
Admission: RE | Admit: 2021-09-24 | Discharge: 2021-09-24 | Disposition: A | Discharge status: Home / Self Care | Payer: Medicare HMO | Source: Ambulatory Visit | Attending: Radiation Oncology | Admitting: Radiation Oncology

## 2021-09-24 DIAGNOSIS — Z510 Encounter for antineoplastic radiation therapy: Secondary | ICD-10-CM | POA: Diagnosis not present

## 2021-09-24 LAB — RAD ONC ARIA SESSION SUMMARY
Course Elapsed Days: 27
Plan Fractions Treated to Date: 18
Plan Prescribed Dose Per Fraction: 2 Gy
Plan Total Fractions Prescribed: 35
Plan Total Prescribed Dose: 70 Gy
Reference Point Dosage Given to Date: 36 Gy
Reference Point Session Dosage Given: 2 Gy
Session Number: 18

## 2021-09-25 ENCOUNTER — Inpatient Hospital Stay (HOSPITAL_BASED_OUTPATIENT_CLINIC_OR_DEPARTMENT_OTHER): Payer: Medicare HMO | Admitting: Internal Medicine

## 2021-09-25 ENCOUNTER — Inpatient Hospital Stay: Payer: Medicare HMO

## 2021-09-25 ENCOUNTER — Encounter: Payer: Self-pay | Admitting: Internal Medicine

## 2021-09-25 ENCOUNTER — Other Ambulatory Visit: Payer: Self-pay | Admitting: Nurse Practitioner

## 2021-09-25 ENCOUNTER — Ambulatory Visit
Admission: RE | Admit: 2021-09-25 | Discharge: 2021-09-25 | Disposition: A | Discharge status: Home / Self Care | Payer: Medicare HMO | Source: Ambulatory Visit | Attending: Radiation Oncology | Admitting: Radiation Oncology

## 2021-09-25 ENCOUNTER — Other Ambulatory Visit: Payer: Self-pay

## 2021-09-25 DIAGNOSIS — C119 Malignant neoplasm of nasopharynx, unspecified: Secondary | ICD-10-CM

## 2021-09-25 DIAGNOSIS — Z5111 Encounter for antineoplastic chemotherapy: Secondary | ICD-10-CM | POA: Diagnosis not present

## 2021-09-25 DIAGNOSIS — Z510 Encounter for antineoplastic radiation therapy: Secondary | ICD-10-CM | POA: Diagnosis not present

## 2021-09-25 LAB — CBC WITH DIFFERENTIAL/PLATELET
Abs Immature Granulocytes: 0.06 10*3/uL (ref 0.00–0.07)
Basophils Absolute: 0 10*3/uL (ref 0.0–0.1)
Basophils Relative: 0 %
Eosinophils Absolute: 0 10*3/uL (ref 0.0–0.5)
Eosinophils Relative: 0 %
HCT: 28.1 % — ABNORMAL LOW (ref 36.0–46.0)
Hemoglobin: 9.4 g/dL — ABNORMAL LOW (ref 12.0–15.0)
Immature Granulocytes: 1 %
Lymphocytes Relative: 2 %
Lymphs Abs: 0.2 10*3/uL — ABNORMAL LOW (ref 0.7–4.0)
MCH: 28.6 pg (ref 26.0–34.0)
MCHC: 33.5 g/dL (ref 30.0–36.0)
MCV: 85.4 fL (ref 80.0–100.0)
Monocytes Absolute: 1 10*3/uL (ref 0.1–1.0)
Monocytes Relative: 10 %
Neutro Abs: 8.8 10*3/uL — ABNORMAL HIGH (ref 1.7–7.7)
Neutrophils Relative %: 87 %
Platelets: 289 10*3/uL (ref 150–400)
RBC: 3.29 MIL/uL — ABNORMAL LOW (ref 3.87–5.11)
RDW: 12.2 % (ref 11.5–15.5)
WBC: 10.1 10*3/uL (ref 4.0–10.5)
nRBC: 0 % (ref 0.0–0.2)

## 2021-09-25 LAB — COMPREHENSIVE METABOLIC PANEL
ALT: 18 U/L (ref 0–44)
AST: 19 U/L (ref 15–41)
Albumin: 3.1 g/dL — ABNORMAL LOW (ref 3.5–5.0)
Alkaline Phosphatase: 98 U/L (ref 38–126)
Anion gap: 10 (ref 5–15)
BUN: 7 mg/dL — ABNORMAL LOW (ref 8–23)
CO2: 26 mmol/L (ref 22–32)
Calcium: 8.5 mg/dL — ABNORMAL LOW (ref 8.9–10.3)
Chloride: 82 mmol/L — ABNORMAL LOW (ref 98–111)
Creatinine, Ser: 0.35 mg/dL — ABNORMAL LOW (ref 0.44–1.00)
GFR, Estimated: >60 mL/min (ref >60)
Glucose, Bld: 139 mg/dL — ABNORMAL HIGH (ref 70–99)
Potassium: 3.3 mmol/L — ABNORMAL LOW (ref 3.5–5.1)
Sodium: 118 mmol/L — CL (ref 135–145)
Total Bilirubin: 0.4 mg/dL (ref 0.3–1.2)
Total Protein: 7.1 g/dL (ref 6.5–8.1)

## 2021-09-25 LAB — RAD ONC ARIA SESSION SUMMARY
Course Elapsed Days: 28
Plan Fractions Treated to Date: 19
Plan Prescribed Dose Per Fraction: 2 Gy
Plan Total Fractions Prescribed: 35
Plan Total Prescribed Dose: 70 Gy
Reference Point Dosage Given to Date: 38 Gy
Reference Point Session Dosage Given: 2 Gy
Session Number: 19

## 2021-09-25 LAB — MAGNESIUM: Magnesium: 1.8 mg/dL (ref 1.7–2.4)

## 2021-09-25 MED ORDER — HEPARIN SOD (PORK) LOCK FLUSH 100 UNIT/ML IV SOLN
500.0000 [IU] | Freq: Once | INTRAVENOUS | Status: AC | PRN
  Administered 2021-09-25: 500 [IU]
  Filled 2021-09-25: qty 5

## 2021-09-25 MED ORDER — HYDROCODONE-ACETAMINOPHEN 10-325 MG PO TABS: 1.0000 | ORAL_TABLET | Freq: Four times a day (QID) | ORAL | 0 refills | Status: AC | PRN

## 2021-09-25 MED ORDER — SODIUM CHLORIDE 0.9% FLUSH
10.0000 mL | Freq: Once | INTRAVENOUS | Status: AC
  Administered 2021-09-25: 10 mL via INTRAVENOUS
  Filled 2021-09-25: qty 10

## 2021-09-25 MED ORDER — SODIUM CHLORIDE 0.9% FLUSH
10.0000 mL | Freq: Once | INTRAVENOUS | Status: AC | PRN
  Administered 2021-09-25: 10 mL
  Filled 2021-09-25: qty 10

## 2021-09-25 MED ORDER — POTASSIUM CHLORIDE CRYS ER 20 MEQ PO TBCR: EXTENDED_RELEASE_TABLET | ORAL | 3 refills | Status: AC

## 2021-09-25 MED ORDER — HEPARIN SOD (PORK) LOCK FLUSH 100 UNIT/ML IV SOLN
500.0000 [IU] | Freq: Once | INTRAVENOUS | Status: AC
  Administered 2021-09-25: 500 [IU] via INTRAVENOUS
  Filled 2021-09-25: qty 5

## 2021-09-25 MED ORDER — SODIUM CHLORIDE 0.9 % IV SOLN
Freq: Once | INTRAVENOUS | Status: AC
  Filled 2021-09-25: qty 250

## 2021-09-25 MED FILL — Dexamethasone Sodium Phosphate Inj 100 MG/10ML: INTRAMUSCULAR | Qty: 1 | Status: AC

## 2021-09-25 MED FILL — Fosaprepitant Dimeglumine For IV Infusion 150 MG (Base Eq): INTRAVENOUS | Qty: 5 | Status: AC

## 2021-09-25 NOTE — Progress Notes (Unsigned)
[place] NOTE  Patient Care Team: Revelo, Elyse Jarvis, MD as PCP - General (Family Medicine) Cammie Sickle, MD as Consulting Physician (Oncology)  CHIEF COMPLAINTS/PURPOSE OF CONSULTATION: head and neck cancer  #  Oncology History Overview Note  # April 26th, 2023-  NASOPHARYNGEAL MASS; ENDOSCOPIC BIOPSY:  - POORLY DIFFERENTIATED CARCINOMA, WITH SQUAMOUS DIFFERENTIATION AND  KERATINIZATION. EBV-NEGATIVE. [Dr.Bennett: ]  Comment:  Immunohistochemical studies show tumor cells to be strongly and  diffusely positive for CK5/6 and p40, and negative for S100, CD45 (LCA),  desmin, and p16  #  PET sacn May 5th, 2023-  Posterior nasopharyngeal mass maximum SUV 12.7, compatible with malignancy; Hypermetabolic right level IIb and right level IV lymph nodes compatible with malignant spread. 3. A 4 mm in short axis left level IIb lymph node has a maximum SUV of 2.1 which is about at the level of the blood pool, probably not involved. 4. Other imaging findings of potential clinical significance: Aortic Atherosclerosis (ICD10-I70.0). Coronary and systemic atherosclerosis. Emphysema (ICD10-J43.9). Sigmoid colon diverticulosis. Scoliosis.     Electronically Signed   By: Van Clines M.D.   On: 08/08/2021 10:56  # STAGE II nasopharyngeal carcinoma [Dr.Bennett];   # MAY 23rd- cisplatin weekly-RT; 5/25- RT   Nasopharyngeal carcinoma ([place])  08/11/2021 Initial Diagnosis   Nasopharyngeal carcinoma ([place])   08/11/2021 Cancer Staging   Staging form: Pharynx - Nasopharynx, AJCC 8th Edition - Clinical: Stage II (cT2, cN1, cM0) - Signed by Cammie Sickle, MD on 08/11/2021 Stage prefix: Initial diagnosis   08/26/2021 -  Chemotherapy   Patient is on Treatment Plan : HEAD/NECK Cisplatin q7d + XRT x 6 Cycles / Cisplatin D1 + 5FU IVCI D1-4 q28d x 3 Cycles        HISTORY OF PRESENTING ILLNESS: Alone.  Ambulating independently.  Vickie Caldwell 71 y.o.   female newly diagnosed nasopharyngeal carcinoma; chronic hyponatremia is currently on weekly cisplatin with radiation.   Patient has poor social support. Pt very depressed, crying. She states she usually can get over it in a couple of days.   Continues to have headaches.  Recently she has taken off Compazine; given a prescription for phenergan to the zofran/compazine combo causing headaches.  Has an appointment with nephrology for chronic  hyponatremia in June.   Patient notes to have a lump in the right neck not significantly worse.  Review of Systems  Constitutional:  Positive for malaise/fatigue. Negative for chills, diaphoresis and fever.  HENT:  Negative for nosebleeds and sore throat.   Eyes:  Negative for double vision.  Respiratory:  Negative for cough, hemoptysis, sputum production, shortness of breath and wheezing.   Cardiovascular:  Negative for chest pain, palpitations, orthopnea and leg swelling.  Gastrointestinal:  Negative for abdominal pain, blood in stool, constipation, diarrhea, heartburn, melena, nausea and vomiting.  Genitourinary:  Negative for dysuria, frequency and urgency.  Musculoskeletal:  Positive for back pain and joint pain.  Skin: Negative.  Negative for itching and rash.  Neurological:  Positive for headaches. Negative for dizziness, tingling, focal weakness and weakness.  Endo/Heme/Allergies:  Does not bruise/bleed easily.  Psychiatric/Behavioral:  Negative for depression. The patient is not nervous/anxious and does not have insomnia.      MEDICAL HISTORY:  Past Medical History:  Diagnosis Date  . Asthma   . COPD (chronic obstructive pulmonary disease) ([place])   . Dysrhythmia   . GERD (gastroesophageal reflux disease)   . History of kidney stones   . Hypertension   .  Osteoarthritis   . TIA (transient ischemic attack) 2011   No Deficits  . Vertigo     SURGICAL HISTORY: Past Surgical History:  Procedure Laterality Date  . ABDOMINAL HYSTERECTOMY     . BREAST BIOPSY Left 1980's   neg. Pt states punch biopsy at the nipple  . IR IMAGING GUIDED PORT INSERTION  08/14/2021  . LYMPH NODE DISSECTION Right    neck  . MANDIBLE SURGERY     Sagittal split  . NASAL ENDOSCOPY Bilateral 07/23/2021   Procedure: NASAL ENDOSCOPY WITH BIOPSY OF NASOPHARYNGEAL MASS;  Surgeon: Clyde Canterbury, MD;  Location: [facility] ORS;  Service: ENT;  Laterality: Bilateral;  . THROAT SURGERY     Vocal cord polyps "burned"  . TUBAL LIGATION    . WISDOM TOOTH EXTRACTION      SOCIAL HISTORY: Social History   Socioeconomic History  . Marital status: Single    Spouse name: Not on file  . Number of children: Not on file  . Years of education: Not on file  . Highest education level: Not on file  Occupational History  . Not on file  Tobacco Use  . Smoking status: Every Day    Packs/day: 1.00    Years: 51.00    Total pack years: 51.00    Types: Cigarettes  . Smokeless tobacco: Never  . Tobacco comments:    Started smoking around age 25  Vaping Use  . Vaping Use: Never used  Substance and Sexual Activity  . Alcohol use: Yes    Alcohol/week: 1.0 standard drink of alcohol    Types: 1 Cans of beer per week    Comment: occasional  . Drug use: Never  . Sexual activity: Not Currently    Birth control/protection: None  Other Topics Concern  . 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 Lives in [place]. With daughter-ZES//brother. From [place].    Social Determinants of Health   Financial Resource Strain: Not on file  Food Insecurity: Not on file  Transportation Needs: Not on file  Physical Activity: Not on file  Stress: Not on file  Social Connections: Not on file  Intimate Partner Violence: Not on file    FAMILY HISTORY: Family History  Problem Relation Age of Onset  . Coronary artery disease Mother   . Cancer Father 52       Oral  . Cancer Brother 51        testicular  . Prostate cancer Brother   . Heart failure Brother   . Breast cancer Neg Hx     ALLERGIES:  is allergic to codeine and penicillins.  MEDICATIONS:  Current Outpatient Medications  Medication Sig Dispense Refill  . acetaminophen (TYLENOL) 325 MG tablet Take 650 mg by mouth every 6 (six) hours as needed.    Marland Kitchen albuterol (VENTOLIN HFA) 108 (90 Base) MCG/ACT inhaler Inhale into the lungs every 6 (six) hours as needed for wheezing or shortness of breath.    Marland Kitchen amLODipine (NORVASC) 10 MG tablet Take 10 mg by mouth daily.    . ASPIRIN 81 PO Take by mouth daily.    . Cholecalciferol (VITAMIN D3 PO) Take by mouth daily.    . diazepam (VALIUM) 5 MG tablet Take 1 tablet (5 mg total) by mouth every 12 (twelve) hours as needed. 30 tablet 0  . famotidine (PEPCID) 10 MG tablet Take 20 mg by mouth daily.    Marland Kitchen  fluticasone (FLONASE) 50 MCG/ACT nasal spray Place into both nostrils daily.    Marland Kitchen HYDROcodone-acetaminophen (NORCO) 10-325 MG tablet Take 1 tablet by mouth every 6 (six) hours as needed. 45 tablet 0  . lactulose (CHRONULAC) 10 GM/15ML solution Take 15 mLs (10 g total) by mouth 2 (two) times daily as needed for moderate constipation. 236 mL 0  . lidocaine-prilocaine (EMLA) cream Apply on the port. 30 -45 min  prior to port access. 30 g 3  . metoprolol succinate (TOPROL-XL) 100 MG 24 hr tablet Take 100 mg by mouth daily. Take with or immediately following a meal.    . montelukast (SINGULAIR) 10 MG tablet Take 10 mg by mouth daily.    Marland Kitchen morphine (MS CONTIN) 30 MG 12 hr tablet Take 1 tablet (30 mg total) by mouth every 12 (twelve) hours. 60 tablet 0  . ondansetron (ZOFRAN) 8 MG tablet One pill every 8 hours as needed for nausea/vomitting. 40 tablet 1  . promethazine (PHENERGAN) 25 MG tablet Take 0.5 tablets (12.5 mg total) by mouth every 8 (eight) hours as needed for refractory nausea / vomiting. 30 tablet 0  . umeclidinium bromide (INCRUSE ELLIPTA) 62.5 MCG/ACT AEPB Inhale 1 puff into the  lungs daily.     No current facility-administered medications for this visit.     PHYSICAL EXAMINATION: ECOG PERFORMANCE STATUS: 1 - Symptomatic but completely ambulatory  Vitals:   09/25/21 1036  BP: 123/74  Pulse: 96  Temp: 99 F (37.2 C)  SpO2: 99%   Filed Weights   09/25/21 1036  Weight: 102 lb 6.4 oz (46.4 kg)   Approximately 2-3 cm lymph node noted in the right submandibular region.  Physical Exam Vitals and nursing note reviewed.  HENT:     Head: Normocephalic and atraumatic.     Mouth/Throat:     Pharynx: Oropharynx is clear.  Eyes:     Extraocular Movements: Extraocular movements intact.     Pupils: Pupils are equal, round, and reactive to light.  Cardiovascular:     Rate and Rhythm: Normal rate and regular rhythm.  Pulmonary:     Comments: Decreased breath sounds bilaterally.  Abdominal:     Palpations: Abdomen is soft.  Musculoskeletal:        General: Normal range of motion.     Cervical back: Normal range of motion.  Skin:    General: Skin is warm.  Neurological:     General: No focal deficit present.     Mental Status: She is alert and oriented to person, place, and time.  Psychiatric:        Behavior: Behavior normal.        Judgment: Judgment normal.     LABORATORY DATA:  I have reviewed the data as listed Lab Results  Component Value Date   WBC 9.8 09/18/2021   HGB 10.5 (L) 09/18/2021   HCT 31.3 (L) 09/18/2021   MCV 86.2 09/18/2021   PLT 319 09/18/2021   Recent Labs    09/12/21 0756 09/18/21 1011 09/25/21 0959  NA 121* 124* 118*  K 4.0 3.6 3.3*  CL 87* 87* 82*  CO2 '27 26 26  '$ GLUCOSE 144* 112* 139*  BUN 12 10 7*  CREATININE 0.43* 0.35* 0.35*  CALCIUM 9.1 9.1 8.5*  GFRNONAA >60 >60 >60  PROT 7.2 7.1 7.1  ALBUMIN 3.2* 3.2* 3.1*  AST '15 16 19  '$ ALT '18 18 18  '$ ALKPHOS 86 94 98  BILITOT 0.2* 0.5 0.4     RADIOGRAPHIC STUDIES:  I have personally reviewed the radiological images as listed and agreed with the findings in the  report. No results found.  ASSESSMENT & PLAN:   No problem-specific Assessment & Plan notes found for this encounter.  All questions were answered. The patient knows to call the clinic with any problems, questions or concerns.   Cammie Sickle, MD 09/25/2021 11:03 AM

## 2021-09-25 NOTE — Assessment & Plan Note (Addendum)
#   April 26th, 2023-  NASOPHARYNGEAL SQUAMOUS EBV-NEGATIVE. [Dr.Bennett: ]; STAGE II [T2 N1]; #  PET scan May 5th, 2023-  Posterior nasopharyngeal mass maximum SUV 12.7, compatible with malignancy; Hypermetabolic right level IIb and right level IV lymph nodes  Currently on definitive chemoradiation cisplatin followed by  from adjuvant chemotherapy. Discussed that the goal of treatment is cure. Currently on patient on weekly cisplatin-with concurrent radiation  [5/25 to july14th*].   # HOLD  with cycle #5  cisplatin tomorrow-given severe hyponatremia. Labs today reviewed;[see below].   #Electrolyte abnormalities: Hypokalemia potassium 3.0-recommend 20 of IV KCl; and also K-Dur 20 mg twice a day.  # Acute on chronic hyponatremia [120-125]; Today- 118-patient is not critically ill.  I again reviewed the recommendations from nephrology.  Recommend intake to one 16 ounce bottle a day. Overall she will have to limit her fluid intake to about 1 to 1.5 L daily. s/p evaluation with Dr.Singh/Nephrology.  Recommend IVFs [M/W/F]; discussed with Dr. Thedore Mins.  # Head aches; on hydrocodne q 6 hours; new script given. Also started on MS contin 30 mg BID- STABLE.    # Swallowing- worse;  S/p  with [facility] nutrition [ensure max-] -continue fluids; pain control as above.  If worsening weight loss recommend-PEG tube placement.  Continue follow-up with nutrition. STABLE.    # Smoking: 1ppd/day; in process of quitting smoking.   # Depression/anxiety- [Dr.macheno-]; valium prn. Prescription given. STABLE.    # IV access/Mediport-functional.   MD-D-1; cis-D-2  # DISPOSITION:  # ADD 1 lit IVFs today- if possible  # HOLD chemo tomorrow; IVFs 1 lit tomorrow; ADD KCL 20 meq tomorrow  # Mon/Wed/Friday-- 1 lit IVFs [coordinate with NP/MD appt]  # follow up in 1 week- NP; labs- cbc/cmp;mag; cisplatin; possible 20 KCL   # follow up in 2 weeks- NP; labs- cbc/cmp; mag; Csiplatin weekly;  possible 20 KCL   #follow up in 3  weeks- MD;  labs- cbc/cmp; mag; Csiplatin weekly;  possible 20 KCL -- Dr.B

## 2021-09-26 ENCOUNTER — Inpatient Hospital Stay: Payer: Medicare HMO

## 2021-09-26 ENCOUNTER — Ambulatory Visit: Payer: Medicare HMO

## 2021-09-26 ENCOUNTER — Other Ambulatory Visit: Payer: Self-pay

## 2021-09-26 ENCOUNTER — Encounter: Payer: Self-pay | Admitting: Internal Medicine

## 2021-09-26 ENCOUNTER — Ambulatory Visit
Admission: RE | Admit: 2021-09-26 | Discharge: 2021-09-26 | Disposition: A | Discharge status: Home / Self Care | Payer: Medicare HMO | Source: Ambulatory Visit | Attending: Radiation Oncology | Admitting: Radiation Oncology

## 2021-09-26 VITALS — BP 108/58 | HR 87 | Temp 98.7°F | Resp 17

## 2021-09-26 DIAGNOSIS — C119 Malignant neoplasm of nasopharynx, unspecified: Secondary | ICD-10-CM

## 2021-09-26 DIAGNOSIS — Z510 Encounter for antineoplastic radiation therapy: Secondary | ICD-10-CM | POA: Diagnosis not present

## 2021-09-26 DIAGNOSIS — Z5111 Encounter for antineoplastic chemotherapy: Secondary | ICD-10-CM | POA: Diagnosis not present

## 2021-09-26 LAB — RAD ONC ARIA SESSION SUMMARY
Course Elapsed Days: 29
Plan Fractions Treated to Date: 20
Plan Prescribed Dose Per Fraction: 2 Gy
Plan Total Fractions Prescribed: 35
Plan Total Prescribed Dose: 70 Gy
Reference Point Dosage Given to Date: 40 Gy
Reference Point Session Dosage Given: 2 Gy
Session Number: 20

## 2021-09-26 MED ORDER — SODIUM CHLORIDE 0.9% FLUSH
10.0000 mL | Freq: Once | INTRAVENOUS | Status: AC | PRN
  Administered 2021-09-26: 10 mL
  Filled 2021-09-26: qty 10

## 2021-09-26 MED ORDER — HEPARIN SOD (PORK) LOCK FLUSH 100 UNIT/ML IV SOLN
500.0000 [IU] | Freq: Once | INTRAVENOUS | Status: AC | PRN
  Administered 2021-09-26: 500 [IU]
  Filled 2021-09-26: qty 5

## 2021-09-26 MED ORDER — POTASSIUM CHLORIDE 20 MEQ/100ML IV SOLN
20.0000 meq | Freq: Once | INTRAVENOUS | Status: AC
  Administered 2021-09-26: 20 meq via INTRAVENOUS

## 2021-09-26 MED ORDER — SODIUM CHLORIDE 0.9 % IV SOLN
Freq: Once | INTRAVENOUS | Status: AC
  Filled 2021-09-26: qty 250

## 2021-09-26 NOTE — Progress Notes (Signed)
Patient tolerated 1 L IVF &  20 mEq K infusion well, no questions/concerns voiced. Patient stable at discharge. AVS given.

## 2021-09-29 ENCOUNTER — Telehealth: Payer: Self-pay

## 2021-09-29 ENCOUNTER — Other Ambulatory Visit: Payer: Self-pay

## 2021-09-29 ENCOUNTER — Inpatient Hospital Stay: Payer: Medicare HMO

## 2021-09-29 ENCOUNTER — Other Ambulatory Visit: Payer: Self-pay | Admitting: Internal Medicine

## 2021-09-29 ENCOUNTER — Ambulatory Visit
Admission: RE | Admit: 2021-09-29 | Discharge: 2021-09-29 | Disposition: A | Discharge status: Home / Self Care | Payer: Medicare HMO | Source: Ambulatory Visit | Attending: Radiation Oncology | Admitting: Radiation Oncology

## 2021-09-29 VITALS — BP 112/92 | HR 101 | Temp 98.0°F | Resp 20

## 2021-09-29 DIAGNOSIS — Z5111 Encounter for antineoplastic chemotherapy: Secondary | ICD-10-CM | POA: Diagnosis not present

## 2021-09-29 DIAGNOSIS — Z510 Encounter for antineoplastic radiation therapy: Secondary | ICD-10-CM | POA: Diagnosis not present

## 2021-09-29 DIAGNOSIS — C119 Malignant neoplasm of nasopharynx, unspecified: Secondary | ICD-10-CM

## 2021-09-29 LAB — RAD ONC ARIA SESSION SUMMARY
Course Elapsed Days: 32
Plan Fractions Treated to Date: 21
Plan Prescribed Dose Per Fraction: 2 Gy
Plan Total Fractions Prescribed: 35
Plan Total Prescribed Dose: 70 Gy
Reference Point Dosage Given to Date: 42 Gy
Reference Point Session Dosage Given: 2 Gy
Session Number: 21

## 2021-09-29 MED ORDER — HEPARIN SOD (PORK) LOCK FLUSH 100 UNIT/ML IV SOLN
500.0000 [IU] | Freq: Once | INTRAVENOUS | Status: AC | PRN
  Administered 2021-09-29: 500 [IU]
  Filled 2021-09-29: qty 5

## 2021-09-29 MED ORDER — MORPHINE SULFATE (PF) 2 MG/ML IV SOLN
2.0000 mg | Freq: Once | INTRAVENOUS | Status: AC | PRN
  Administered 2021-09-29: 2 mg via INTRAVENOUS
  Filled 2021-09-29: qty 1

## 2021-09-29 MED ORDER — SODIUM CHLORIDE 0.9% FLUSH
10.0000 mL | Freq: Once | INTRAVENOUS | Status: AC | PRN
  Administered 2021-09-29: 10 mL
  Filled 2021-09-29: qty 10

## 2021-09-29 MED ORDER — POTASSIUM CHLORIDE 20 MEQ/15ML (10%) PO SOLN: 20.0000 meq | Freq: Three times a day (TID) | ORAL | 2 refills | Status: AC

## 2021-09-29 MED ORDER — OXYCODONE HCL 10 MG PO TABS: 10.0000 mg | ORAL_TABLET | ORAL | 0 refills | Status: AC | PRN

## 2021-09-29 MED ORDER — SODIUM CHLORIDE 0.9 % IV SOLN
Freq: Once | INTRAVENOUS | Status: AC
  Filled 2021-09-29: qty 250

## 2021-09-30 ENCOUNTER — Other Ambulatory Visit: Payer: Self-pay

## 2021-09-30 ENCOUNTER — Ambulatory Visit
Admission: RE | Admit: 2021-09-30 | Discharge: 2021-09-30 | Disposition: A | Discharge status: Home / Self Care | Payer: Medicare HMO | Source: Ambulatory Visit | Attending: Radiation Oncology | Admitting: Radiation Oncology

## 2021-09-30 DIAGNOSIS — Z510 Encounter for antineoplastic radiation therapy: Secondary | ICD-10-CM | POA: Diagnosis not present

## 2021-09-30 LAB — RAD ONC ARIA SESSION SUMMARY
Course Elapsed Days: 33
Plan Fractions Treated to Date: 22
Plan Prescribed Dose Per Fraction: 2 Gy
Plan Total Fractions Prescribed: 35
Plan Total Prescribed Dose: 70 Gy
Reference Point Dosage Given to Date: 44 Gy
Reference Point Session Dosage Given: 2 Gy
Session Number: 22

## 2021-10-01 ENCOUNTER — Inpatient Hospital Stay: Payer: Medicare HMO

## 2021-10-01 ENCOUNTER — Other Ambulatory Visit: Payer: Self-pay | Admitting: Internal Medicine

## 2021-10-01 ENCOUNTER — Other Ambulatory Visit: Payer: Self-pay

## 2021-10-01 ENCOUNTER — Other Ambulatory Visit: Payer: Medicare HMO

## 2021-10-01 ENCOUNTER — Ambulatory Visit
Admission: RE | Admit: 2021-10-01 | Discharge: 2021-10-01 | Disposition: A | Discharge status: Home / Self Care | Payer: Medicare HMO | Source: Ambulatory Visit | Attending: Radiation Oncology | Admitting: Radiation Oncology

## 2021-10-01 ENCOUNTER — Ambulatory Visit: Payer: Medicare HMO | Admitting: Medical Oncology

## 2021-10-01 DIAGNOSIS — Z510 Encounter for antineoplastic radiation therapy: Secondary | ICD-10-CM | POA: Diagnosis not present

## 2021-10-01 DIAGNOSIS — C119 Malignant neoplasm of nasopharynx, unspecified: Secondary | ICD-10-CM

## 2021-10-01 DIAGNOSIS — Z5111 Encounter for antineoplastic chemotherapy: Secondary | ICD-10-CM | POA: Diagnosis not present

## 2021-10-01 LAB — RAD ONC ARIA SESSION SUMMARY
Course Elapsed Days: 34
Plan Fractions Treated to Date: 23
Plan Prescribed Dose Per Fraction: 2 Gy
Plan Total Fractions Prescribed: 35
Plan Total Prescribed Dose: 70 Gy
Reference Point Dosage Given to Date: 46 Gy
Reference Point Session Dosage Given: 2 Gy
Session Number: 23

## 2021-10-01 MED ORDER — SODIUM CHLORIDE 0.9 % IV SOLN
Freq: Once | INTRAVENOUS | Status: AC
  Filled 2021-10-01: qty 250

## 2021-10-01 MED ORDER — DIAZEPAM 5 MG PO TABS: 5.0000 mg | ORAL_TABLET | Freq: Two times a day (BID) | ORAL | 0 refills | Status: AC | PRN

## 2021-10-01 MED ORDER — HEPARIN SOD (PORK) LOCK FLUSH 100 UNIT/ML IV SOLN
500.0000 [IU] | Freq: Once | INTRAVENOUS | Status: AC | PRN
  Administered 2021-10-01: 500 [IU]
  Filled 2021-10-01: qty 5

## 2021-10-01 MED ORDER — SODIUM CHLORIDE 0.9% FLUSH
10.0000 mL | Freq: Once | INTRAVENOUS | Status: AC | PRN
  Administered 2021-10-01: 10 mL
  Filled 2021-10-01: qty 10

## 2021-10-01 NOTE — Progress Notes (Addendum)
Nutrition Follow-up:   Patient with SCC of nasopharynx.  Patient receiving concurrent chemotherapy and radiation.    Met with patient during infusion.  Patient was tearful.  Wants clarification on how to take potassium medication. Upset about having reliable transportation to cancer center and daughter having multiple people come to house.  "I have a lot on me right now."  Says that she is drinking ensure max protein (150 calories) shake. "I am mostly doing liquids." Trying to manage fluids with hyponatremia and dry mouth.  Reports that she had a good bowel movement and got "cleaned out."      Medications: reviewed  Labs: reviewed  Anthropometrics:   Weight 97.1 lb in [facility] on 6/27  102 lb 6.4 oz on 6/23 101 lb on 6/15 104 lb 8 oz on 6/2 106 lb on 5/23   8% weight loss in the last month, significant  NUTRITION DIAGNOSIS: Predicted sub optimal energy intake continues   INTERVENTION:  Offered LCSW to offer support/counseling and patient declined at this time.  Complimentary case of ensure enlive (350 calorie) given to patient.  Encouraged 350 calorie shake vs 150 calorie.   Encouraged patient to add ice cream to shakes for added calories.   Provided medicine cups and marked 36m line for patient and reviewed how to take K medication.  Consider discussion about feeding tube placement with poor po intake, 8% weight loss in the last month   MONITORING, EVALUATION, GOAL: weight trends, intake   NEXT VISIT: Friday, July 14 during infusion  Alaycia Eardley B. AZenia Resides [place] L[place]olaRegistered Dietitian [phone]

## 2021-10-02 ENCOUNTER — Inpatient Hospital Stay: Payer: Medicare HMO

## 2021-10-02 ENCOUNTER — Inpatient Hospital Stay (HOSPITAL_BASED_OUTPATIENT_CLINIC_OR_DEPARTMENT_OTHER): Payer: Medicare HMO | Admitting: Medical Oncology

## 2021-10-02 ENCOUNTER — Ambulatory Visit
Admission: RE | Admit: 2021-10-02 | Discharge: 2021-10-02 | Disposition: A | Discharge status: Home / Self Care | Payer: Medicare HMO | Source: Ambulatory Visit | Attending: Radiation Oncology | Admitting: Radiation Oncology

## 2021-10-02 ENCOUNTER — Other Ambulatory Visit: Payer: Self-pay

## 2021-10-02 VITALS — BP 127/76 | HR 93 | Temp 98.7°F | Resp 16 | Wt 98.0 lb

## 2021-10-02 DIAGNOSIS — Z5111 Encounter for antineoplastic chemotherapy: Secondary | ICD-10-CM | POA: Diagnosis not present

## 2021-10-02 DIAGNOSIS — E876 Hypokalemia: Secondary | ICD-10-CM

## 2021-10-02 DIAGNOSIS — C119 Malignant neoplasm of nasopharynx, unspecified: Secondary | ICD-10-CM | POA: Diagnosis not present

## 2021-10-02 DIAGNOSIS — E871 Hypo-osmolality and hyponatremia: Secondary | ICD-10-CM

## 2021-10-02 DIAGNOSIS — Z510 Encounter for antineoplastic radiation therapy: Secondary | ICD-10-CM | POA: Diagnosis not present

## 2021-10-02 DIAGNOSIS — Z95828 Presence of other vascular implants and grafts: Secondary | ICD-10-CM

## 2021-10-02 LAB — RAD ONC ARIA SESSION SUMMARY
Course Elapsed Days: 35
Plan Fractions Treated to Date: 24
Plan Prescribed Dose Per Fraction: 2 Gy
Plan Total Fractions Prescribed: 35
Plan Total Prescribed Dose: 70 Gy
Reference Point Dosage Given to Date: 48 Gy
Reference Point Session Dosage Given: 2 Gy
Session Number: 24

## 2021-10-02 LAB — CBC WITH DIFFERENTIAL/PLATELET
Abs Immature Granulocytes: 0.07 10*3/uL (ref 0.00–0.07)
Basophils Absolute: 0 10*3/uL (ref 0.0–0.1)
Basophils Relative: 0 %
Eosinophils Absolute: 0 10*3/uL (ref 0.0–0.5)
Eosinophils Relative: 0 %
HCT: 29.1 % — ABNORMAL LOW (ref 36.0–46.0)
Hemoglobin: 9.8 g/dL — ABNORMAL LOW (ref 12.0–15.0)
Immature Granulocytes: 1 %
Lymphocytes Relative: 2 %
Lymphs Abs: 0.2 10*3/uL — ABNORMAL LOW (ref 0.7–4.0)
MCH: 28.5 pg (ref 26.0–34.0)
MCHC: 33.7 g/dL (ref 30.0–36.0)
MCV: 84.6 fL (ref 80.0–100.0)
Monocytes Absolute: 1 10*3/uL (ref 0.1–1.0)
Monocytes Relative: 9 %
Neutro Abs: 8.8 10*3/uL — ABNORMAL HIGH (ref 1.7–7.7)
Neutrophils Relative %: 88 %
Platelets: 388 10*3/uL (ref 150–400)
RBC: 3.44 MIL/uL — ABNORMAL LOW (ref 3.87–5.11)
RDW: 12.5 % (ref 11.5–15.5)
WBC: 10.1 10*3/uL (ref 4.0–10.5)
nRBC: 0 % (ref 0.0–0.2)

## 2021-10-02 LAB — COMPREHENSIVE METABOLIC PANEL
ALT: 24 U/L (ref 0–44)
AST: 25 U/L (ref 15–41)
Albumin: 2.8 g/dL — ABNORMAL LOW (ref 3.5–5.0)
Alkaline Phosphatase: 96 U/L (ref 38–126)
Anion gap: 12 (ref 5–15)
BUN: <5 mg/dL — ABNORMAL LOW (ref 8–23)
CO2: 27 mmol/L (ref 22–32)
Calcium: 8.6 mg/dL — ABNORMAL LOW (ref 8.9–10.3)
Chloride: 82 mmol/L — ABNORMAL LOW (ref 98–111)
Creatinine, Ser: 0.43 mg/dL — ABNORMAL LOW (ref 0.44–1.00)
GFR, Estimated: >60 mL/min (ref >60)
Glucose, Bld: 130 mg/dL — ABNORMAL HIGH (ref 70–99)
Potassium: 2.7 mmol/L — CL (ref 3.5–5.1)
Sodium: 121 mmol/L — ABNORMAL LOW (ref 135–145)
Total Bilirubin: 0.5 mg/dL (ref 0.3–1.2)
Total Protein: 6.7 g/dL (ref 6.5–8.1)

## 2021-10-02 LAB — MAGNESIUM: Magnesium: 1.8 mg/dL (ref 1.7–2.4)

## 2021-10-02 MED ORDER — SODIUM CHLORIDE 0.9% FLUSH
10.0000 mL | Freq: Once | INTRAVENOUS | Status: AC
  Administered 2021-10-02: 10 mL via INTRAVENOUS
  Filled 2021-10-02: qty 10

## 2021-10-02 MED ORDER — POTASSIUM CHLORIDE IN NACL 20-0.9 MEQ/L-% IV SOLN
INTRAVENOUS | Status: AC
  Filled 2021-10-02: qty 1000

## 2021-10-02 MED ORDER — HEPARIN SOD (PORK) LOCK FLUSH 100 UNIT/ML IV SOLN
500.0000 [IU] | Freq: Once | INTRAVENOUS | Status: AC
  Administered 2021-10-02: 500 [IU] via INTRAVENOUS
  Filled 2021-10-02: qty 5

## 2021-10-02 MED FILL — Dexamethasone Sodium Phosphate Inj 100 MG/10ML: INTRAMUSCULAR | Qty: 1 | Status: AC

## 2021-10-02 MED FILL — Fosaprepitant Dimeglumine For IV Infusion 150 MG (Base Eq): INTRAVENOUS | Qty: 5 | Status: AC

## 2021-10-02 NOTE — Progress Notes (Signed)
[place] NOTE  Patient Care Team: Revelo, Elyse Jarvis, MD as PCP - General (Family Medicine) Cammie Sickle, MD as Consulting Physician (Oncology)  CHIEF COMPLAINTS/PURPOSE OF CONSULTATION: head and neck cancer  #  Oncology History Overview Note  # April 26th, 2023-  NASOPHARYNGEAL MASS; ENDOSCOPIC BIOPSY:  - POORLY DIFFERENTIATED CARCINOMA, WITH SQUAMOUS DIFFERENTIATION AND  KERATINIZATION. EBV-NEGATIVE. [Dr.Bennett: ]  Comment:  Immunohistochemical studies show tumor cells to be strongly and  diffusely positive for CK5/6 and p40, and negative for S100, CD45 (LCA),  desmin, and p16  #  PET sacn May 5th, 2023-  Posterior nasopharyngeal mass maximum SUV 12.7, compatible with malignancy; Hypermetabolic right level IIb and right level IV lymph nodes compatible with malignant spread. 3. A 4 mm in short axis left level IIb lymph node has a maximum SUV of 2.1 which is about at the level of the blood pool, probably not involved. 4. Other imaging findings of potential clinical significance: Aortic Atherosclerosis (ICD10-I70.0). Coronary and systemic atherosclerosis. Emphysema (ICD10-J43.9). Sigmoid colon diverticulosis. Scoliosis.     Electronically Signed   By: Van Clines M.D.   On: 08/08/2021 10:56  # STAGE II nasopharyngeal carcinoma [Dr.Bennett];   # MAY 23rd- cisplatin weekly-RT; 5/25- RT   Nasopharyngeal carcinoma ([place])  08/11/2021 Initial Diagnosis   Nasopharyngeal carcinoma ([place])   08/11/2021 Cancer Staging   Staging form: Pharynx - Nasopharynx, AJCC 8th Edition - Clinical: Stage II (cT2, cN1, cM0) - Signed by Cammie Sickle, MD on 08/11/2021 Stage prefix: Initial diagnosis   08/26/2021 -  Chemotherapy   Patient is on Treatment Plan : HEAD/NECK Cisplatin q7d + XRT x 6 Cycles / Cisplatin D1 + 5FU IVCI D1-4 q28d x 3 Cycles        HISTORY OF PRESENTING ILLNESS: Alone.  Ambulating independently.  Vickie Caldwell 71 y.o.   female newly diagnosed nasopharyngeal carcinoma; chronic hyponatremia is currently on weekly cisplatin with radiation.   Continues to be followed by nutrition and nephrology for her hyponatremia, electrolyte imbalance and weight loss.   Eating and drinking continues to improve- she is now adding in some soft foods like eggs and tolerating this well. The lump in her neck is much smaller per patient which is helping her swallow easier. Having a bit more energy but still fatigued.   Is having difficulty opening pill bottles. She discussed this more with our nurse during intake and is going to go to the pharmacy after our visit to ask for arthritis bottles or non-child proof containers instead.    Review of Systems  Constitutional:  Positive for malaise/fatigue. Negative for chills, diaphoresis and fever.  HENT:  Negative for nosebleeds and sore throat.   Eyes:  Negative for double vision.  Respiratory:  Negative for cough, hemoptysis, sputum production, shortness of breath and wheezing.   Cardiovascular:  Negative for chest pain, palpitations, orthopnea and leg swelling.  Gastrointestinal:  Negative for abdominal pain, blood in stool, constipation, diarrhea, heartburn, melena, nausea and vomiting.  Genitourinary:  Negative for dysuria, frequency and urgency.  Musculoskeletal:  Positive for back pain and joint pain.  Skin: Negative.  Negative for itching and rash.  Neurological:  Negative for dizziness, tingling, focal weakness, weakness and headaches.  Endo/Heme/Allergies:  Does not bruise/bleed easily.  Psychiatric/Behavioral:  Negative for depression. The patient is not nervous/anxious and does not have insomnia.      MEDICAL HISTORY:  Past Medical History:  Diagnosis Date  . Asthma   . COPD (chronic  obstructive pulmonary disease) ([place])   . Dysrhythmia   . GERD (gastroesophageal reflux disease)   . History of kidney stones   . Hypertension   . Osteoarthritis   . TIA (transient  ischemic attack) 2011   No Deficits  . Vertigo     SURGICAL HISTORY: Past Surgical History:  Procedure Laterality Date  . ABDOMINAL HYSTERECTOMY    . BREAST BIOPSY Left 1980's   neg. Pt states punch biopsy at the nipple  . IR IMAGING GUIDED PORT INSERTION  08/14/2021  . LYMPH NODE DISSECTION Right    neck  . MANDIBLE SURGERY     Sagittal split  . NASAL ENDOSCOPY Bilateral 07/23/2021   Procedure: NASAL ENDOSCOPY WITH BIOPSY OF NASOPHARYNGEAL MASS;  Surgeon: Clyde Canterbury, MD;  Location: [facility] ORS;  Service: ENT;  Laterality: Bilateral;  . THROAT SURGERY     Vocal cord polyps "burned"  . TUBAL LIGATION    . WISDOM TOOTH EXTRACTION      SOCIAL HISTORY: Social History   Socioeconomic History  . Marital status: Single    Spouse name: Not on file  . Number of children: Not on file  . Years of education: Not on file  . Highest education level: Not on file  Occupational History  . Not on file  Tobacco Use  . Smoking status: Every Day    Packs/day: 1.00    Years: 51.00    Total pack years: 51.00    Types: Cigarettes  . Smokeless tobacco: Never  . Tobacco comments:    Started smoking around age 33  Vaping Use  . Vaping Use: Never used  Substance and Sexual Activity  . Alcohol use: Yes    Alcohol/week: 1.0 standard drink of alcohol    Types: 1 Cans of beer per week    Comment: occasional  . Drug use: Never  . Sexual activity: Not Currently    Birth control/protection: None  Other Topics Concern  . 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 Lives in [place]. With daughter-ZES//brother. From [place].    Social Determinants of Health   Financial Resource Strain: Not on file  Food Insecurity: Not on file  Transportation Needs: No Transportation Needs (10/02/2021)   PRAPARE - Transportation   . Lack of Transportation (Medical): No   . Lack of Transportation (Non-Medical): No  Physical  Activity: Not on file  Stress: Not on file  Social Connections: Not on file  Intimate Partner Violence: Not on file    FAMILY HISTORY: Family History  Problem Relation Age of Onset  . Coronary artery disease Mother   . Cancer Father 67       Oral  . Cancer Brother 36       testicular  . Prostate cancer Brother   . Heart failure Brother   . Breast cancer Neg Hx     ALLERGIES:  is allergic to codeine and penicillins.  MEDICATIONS:  Current Outpatient Medications  Medication Sig Dispense Refill  . acetaminophen (TYLENOL) 325 MG tablet Take 650 mg by mouth every 6 (six) hours as needed.    Marland Kitchen albuterol (VENTOLIN HFA) 108 (90 Base) MCG/ACT inhaler Inhale into the lungs every 6 (six) hours as needed for wheezing or shortness of breath.    Marland Kitchen amLODipine (NORVASC) 10 MG tablet Take 10 mg by mouth daily.    . ASPIRIN 81 PO Take by mouth daily.    Marland Kitchen  Cholecalciferol (VITAMIN D3 PO) Take by mouth daily.    . diazepam (VALIUM) 5 MG tablet Take 1 tablet (5 mg total) by mouth every 12 (twelve) hours as needed. 60 tablet 0  . famotidine (PEPCID) 10 MG tablet Take 20 mg by mouth daily.    . fluticasone (FLONASE) 50 MCG/ACT nasal spray Place into both nostrils daily.    Marland Kitchen HYDROcodone-acetaminophen (NORCO) 10-325 MG tablet Take 1 tablet by mouth every 6 (six) hours as needed. 45 tablet 0  . lactulose (CHRONULAC) 10 GM/15ML solution Take 15 mLs (10 g total) by mouth 2 (two) times daily as needed for moderate constipation. 236 mL 0  . lidocaine-prilocaine (EMLA) cream Apply on the port. 30 -45 min  prior to port access. 30 g 3  . metoprolol succinate (TOPROL-XL) 100 MG 24 hr tablet Take 100 mg by mouth daily. Take with or immediately following a meal.    . montelukast (SINGULAIR) 10 MG tablet Take 10 mg by mouth daily.    Marland Kitchen morphine (MS CONTIN) 30 MG 12 hr tablet Take 1 tablet (30 mg total) by mouth every 12 (twelve) hours. 60 tablet 0  . ondansetron (ZOFRAN) 8 MG tablet One pill every 8 hours as  needed for nausea/vomitting. 40 tablet 1  . Oxycodone HCl 10 MG TABS Take 1 tablet (10 mg total) by mouth every 4 (four) hours as needed. 84 tablet 0  . potassium chloride 20 MEQ/15ML (10%) SOLN Take 15 mLs (20 mEq total) by mouth 3 (three) times daily. 473 mL 2  . potassium chloride SA (KLOR-CON M) 20 MEQ tablet 1 pill twice a day 60 tablet 3  . promethazine (PHENERGAN) 25 MG tablet Take 0.5 tablets (12.5 mg total) by mouth every 8 (eight) hours as needed for refractory nausea / vomiting. 30 tablet 0  . umeclidinium bromide (INCRUSE ELLIPTA) 62.5 MCG/ACT AEPB Inhale 1 puff into the lungs daily.     No current facility-administered medications for this visit.     PHYSICAL EXAMINATION: ECOG PERFORMANCE STATUS: 1 - Symptomatic but completely ambulatory  Vitals:   10/02/21 1013  BP: 127/76  Pulse: 93  Resp: 16  Temp: 98.7 F (37.1 C)   Filed Weights   10/02/21 1013  Weight: 98 lb (44.5 kg)   Approximately 2-3 cm lymph node noted in the right submandibular region.  Multiple oral ulcers noted.  Physical Exam Vitals and nursing note reviewed.  HENT:     Head: Normocephalic and atraumatic.     Mouth/Throat:     Pharynx: Oropharynx is clear.  Eyes:     Extraocular Movements: Extraocular movements intact.     Pupils: Pupils are equal, round, and reactive to light.  Cardiovascular:     Rate and Rhythm: Normal rate and regular rhythm.  Pulmonary:     Comments: Decreased breath sounds bilaterally.  Abdominal:     Palpations: Abdomen is soft.  Musculoskeletal:        General: Normal range of motion.     Cervical back: Normal range of motion.  Skin:    General: Skin is warm.  Neurological:     General: No focal deficit present.     Mental Status: She is alert and oriented to person, place, and time.  Psychiatric:        Behavior: Behavior normal.        Judgment: Judgment normal.     LABORATORY DATA:  I have reviewed the data as listed Lab Results  Component Value Date  WBC 10.1 10/02/2021   HGB 9.8 (L) 10/02/2021   HCT 29.1 (L) 10/02/2021   MCV 84.6 10/02/2021   PLT 388 10/02/2021   Recent Labs    09/18/21 1011 09/25/21 0959 10/02/21 0944  NA 124* 118* 121*  K 3.6 3.3* 2.7*  CL 87* 82* 82*  CO2 '26 26 27  '$ GLUCOSE 112* 139* 130*  BUN 10 7* <5*  CREATININE 0.35* 0.35* 0.43*  CALCIUM 9.1 8.5* 8.6*  GFRNONAA >60 >60 >60  PROT 7.1 7.1 6.7  ALBUMIN 3.2* 3.1* 2.8*  AST '16 19 25  '$ ALT '18 18 24  '$ ALKPHOS 94 98 96  BILITOT 0.5 0.4 0.5     RADIOGRAPHIC STUDIES: I have personally reviewed the radiological images as listed and agreed with the findings in the report. No results found.  ASSESSMENT & PLAN:  Encounter Diagnoses  Name Primary?  . Nasopharyngeal carcinoma ([place]) Yes  . Encounter for antineoplastic chemotherapy   . Chronic hyponatremia   . Hypokalemia    Improving with treatment and radiation. Cycle 5 has been held due to electrolyte imbalance.  2. Cycle 5 of Cisplatin on hold. Will reevaluate tomorrow 3. Chronic. Improved with IVF. 1L IVF today. Follow up tomorrow. Continue following nephrologies recommendations 4. Acute on chronic. [place] today IV. Continue home supplementation. Follow up tomorrow for fluids, likely K. Reviewed red flags.    All questions were answered. The patient knows to call the clinic with any problems, questions or concerns.   Hughie Closs, PA-C 10/02/2021 11:06 AM

## 2021-10-02 NOTE — Progress Notes (Signed)
Pt returns for follow-up. She verbalizes frustration due to pain and the inability to open her pill bottles. Assisted patient with opening her pain medication, as she had not had a dose in over 12 hours. Encouraged pt to contact pharmacy for recommendations.

## 2021-10-02 NOTE — Progress Notes (Signed)
Pt's main source of transportation will not be able to provide starting Wednesday 7/5. Only 1 car in her household and will not be able to depend on family for all transportation needs.

## 2021-10-03 ENCOUNTER — Ambulatory Visit: Payer: Medicare HMO

## 2021-10-03 ENCOUNTER — Ambulatory Visit
Admission: RE | Admit: 2021-10-03 | Discharge: 2021-10-03 | Disposition: A | Discharge status: Home / Self Care | Payer: Medicare HMO | Source: Ambulatory Visit | Attending: Radiation Oncology | Admitting: Radiation Oncology

## 2021-10-03 ENCOUNTER — Inpatient Hospital Stay: Payer: Medicare HMO

## 2021-10-03 ENCOUNTER — Other Ambulatory Visit: Payer: Self-pay

## 2021-10-03 ENCOUNTER — Inpatient Hospital Stay (HOSPITAL_BASED_OUTPATIENT_CLINIC_OR_DEPARTMENT_OTHER): Payer: Medicare HMO | Admitting: Medical Oncology

## 2021-10-03 ENCOUNTER — Other Ambulatory Visit: Payer: Medicare HMO

## 2021-10-03 VITALS — BP 129/60 | HR 74 | Temp 98.1°F | Resp 16 | Ht <= 58 in | Wt 98.9 lb

## 2021-10-03 DIAGNOSIS — E876 Hypokalemia: Secondary | ICD-10-CM

## 2021-10-03 DIAGNOSIS — C119 Malignant neoplasm of nasopharynx, unspecified: Secondary | ICD-10-CM

## 2021-10-03 DIAGNOSIS — Z510 Encounter for antineoplastic radiation therapy: Secondary | ICD-10-CM | POA: Diagnosis not present

## 2021-10-03 DIAGNOSIS — Z5111 Encounter for antineoplastic chemotherapy: Secondary | ICD-10-CM | POA: Diagnosis not present

## 2021-10-03 LAB — COMPREHENSIVE METABOLIC PANEL
ALT: 23 U/L (ref 0–44)
AST: 21 U/L (ref 15–41)
Albumin: 2.6 g/dL — ABNORMAL LOW (ref 3.5–5.0)
Alkaline Phosphatase: 93 U/L (ref 38–126)
Anion gap: 10 (ref 5–15)
BUN: <5 mg/dL — ABNORMAL LOW (ref 8–23)
CO2: 28 mmol/L (ref 22–32)
Calcium: 8.6 mg/dL — ABNORMAL LOW (ref 8.9–10.3)
Chloride: 84 mmol/L — ABNORMAL LOW (ref 98–111)
Creatinine, Ser: 0.33 mg/dL — ABNORMAL LOW (ref 0.44–1.00)
GFR, Estimated: >60 mL/min (ref >60)
Glucose, Bld: 132 mg/dL — ABNORMAL HIGH (ref 70–99)
Potassium: 2.7 mmol/L — CL (ref 3.5–5.1)
Sodium: 122 mmol/L — ABNORMAL LOW (ref 135–145)
Total Bilirubin: 0.6 mg/dL (ref 0.3–1.2)
Total Protein: 6.6 g/dL (ref 6.5–8.1)

## 2021-10-03 LAB — CBC WITH DIFFERENTIAL/PLATELET
Abs Immature Granulocytes: 0.09 10*3/uL — ABNORMAL HIGH (ref 0.00–0.07)
Basophils Absolute: 0 10*3/uL (ref 0.0–0.1)
Basophils Relative: 0 %
Eosinophils Absolute: 0 10*3/uL (ref 0.0–0.5)
Eosinophils Relative: 0 %
HCT: 28.6 % — ABNORMAL LOW (ref 36.0–46.0)
Hemoglobin: 9.5 g/dL — ABNORMAL LOW (ref 12.0–15.0)
Immature Granulocytes: 1 %
Lymphocytes Relative: 2 %
Lymphs Abs: 0.3 10*3/uL — ABNORMAL LOW (ref 0.7–4.0)
MCH: 28.1 pg (ref 26.0–34.0)
MCHC: 33.2 g/dL (ref 30.0–36.0)
MCV: 84.6 fL (ref 80.0–100.0)
Monocytes Absolute: 1.3 10*3/uL — ABNORMAL HIGH (ref 0.1–1.0)
Monocytes Relative: 10 %
Neutro Abs: 11 10*3/uL — ABNORMAL HIGH (ref 1.7–7.7)
Neutrophils Relative %: 87 %
Platelets: 416 10*3/uL — ABNORMAL HIGH (ref 150–400)
RBC: 3.38 MIL/uL — ABNORMAL LOW (ref 3.87–5.11)
RDW: 12.4 % (ref 11.5–15.5)
WBC: 12.6 10*3/uL — ABNORMAL HIGH (ref 4.0–10.5)
nRBC: 0 % (ref 0.0–0.2)

## 2021-10-03 LAB — RAD ONC ARIA SESSION SUMMARY
Course Elapsed Days: 36
Plan Fractions Treated to Date: 25
Plan Prescribed Dose Per Fraction: 2 Gy
Plan Total Fractions Prescribed: 35
Plan Total Prescribed Dose: 70 Gy
Reference Point Dosage Given to Date: 50 Gy
Reference Point Session Dosage Given: 2 Gy
Session Number: 25

## 2021-10-03 LAB — MAGNESIUM: Magnesium: 1.7 mg/dL (ref 1.7–2.4)

## 2021-10-03 MED ORDER — HEPARIN SOD (PORK) LOCK FLUSH 100 UNIT/ML IV SOLN
INTRAVENOUS | Status: AC
  Filled 2021-10-03: qty 5

## 2021-10-03 MED ORDER — POTASSIUM CHLORIDE 20 MEQ/100ML IV SOLN
20.0000 meq | Freq: Once | INTRAVENOUS | Status: AC
  Administered 2021-10-03: 20 meq via INTRAVENOUS

## 2021-10-03 MED ORDER — SODIUM CHLORIDE 0.9 % IV SOLN: 40.0000 meq | Freq: Once | INTRAVENOUS | Status: DC

## 2021-10-03 MED ORDER — HEPARIN SOD (PORK) LOCK FLUSH 100 UNIT/ML IV SOLN
500.0000 [IU] | Freq: Once | INTRAVENOUS | Status: AC | PRN
  Administered 2021-10-03: 500 [IU]
  Filled 2021-10-03: qty 5

## 2021-10-03 MED ORDER — SODIUM CHLORIDE 0.9 % IV SOLN
Freq: Once | INTRAVENOUS | Status: AC
  Filled 2021-10-03: qty 250

## 2021-10-03 NOTE — Patient Instructions (Signed)
Hypokalemia Hypokalemia means that the amount of potassium in the blood is lower than normal. Potassium is a mineral (electrolyte) that helps regulate the amount of fluid in the body. It also stimulates muscle tightening (contraction) and helps nerves work properly. Normally, most of the body's potassium is inside cells, and only a very small amount is in the blood. Because the amount in the blood is so small, minor changes to potassium levels in the blood can be life-threatening. What are the causes? This condition may be caused by: Antibiotic medicine. Diarrhea or vomiting. Taking too much of a medicine that helps you have a bowel movement (laxative) can cause diarrhea and lead to hypokalemia. Chronic kidney disease (CKD). Medicines that help the body get rid of excess fluid (diuretics). Eating disorders, such as anorexia or bulimia. Low magnesium levels in the body. Sweating a lot. What are the signs or symptoms? Symptoms of this condition include: Weakness. Constipation. Fatigue. Muscle cramps. Mental confusion. Skipped heartbeats or irregular heartbeat (palpitations). Tingling or numbness. How is this diagnosed? This condition is diagnosed with a blood test. How is this treated? This condition may be treated by: Taking potassium supplements. Adjusting the medicines that you take. Eating more foods that contain a lot of potassium. If your potassium level is very low, you may need to get potassium through an IV and be monitored in the hospital. Follow these instructions at home: Eating and drinking  Eat a healthy diet. A healthy diet includes fresh fruits and vegetables, whole grains, healthy fats, and lean proteins. If told, eat more foods that contain a lot of potassium. These include: Nuts, such as peanuts and pistachios. Seeds, such as sunflower seeds and pumpkin seeds. Peas, lentils, and lima beans. Whole grain and bran cereals and breads. Fresh fruits and vegetables,  such as apricots, avocado, bananas, cantaloupe, kiwi, oranges, tomatoes, asparagus, and potatoes. Juices, such as orange, tomato, and prune. Lean meats, including fish. Milk and milk products, such as yogurt. General instructions Take over-the-counter and prescription medicines only as told by your health care provider. This includes vitamins, natural food products, and supplements. Keep all follow-up visits. This is important. Contact a health care provider if: You have weakness that gets worse. You feel your heart pounding or racing. You vomit. You have diarrhea. You have diabetes and you have trouble keeping your blood sugar in your target range. Get help right away if: You have chest pain. You have shortness of breath. You have vomiting or diarrhea that lasts for more than 2 days. You faint. These symptoms may be an emergency. Get help right away. Call 911. Do not wait to see if the symptoms will go away. Do not drive yourself to the hospital. Summary Hypokalemia means that the amount of potassium in the blood is lower than normal. This condition is diagnosed with a blood test. Hypokalemia may be treated by taking potassium supplements, adjusting the medicines that you take, or eating more foods that are high in potassium. If your potassium level is very low, you may need to get potassium through an IV and be monitored in the hospital. This information is not intended to replace advice given to you by your health care provider. Make sure you discuss any questions you have with your health care provider. Document Revised: 12/05/2020 Document Reviewed: 12/05/2020 Elsevier Patient Education  2023 [employer]  

## 2021-10-03 NOTE — Progress Notes (Signed)
[place] NOTE  Patient Care Team: Revelo, Elyse Jarvis, MD as PCP - General (Family Medicine) Cammie Sickle, MD as Consulting Physician (Oncology)  CHIEF COMPLAINTS/PURPOSE OF CONSULTATION: head and neck cancer  #  Oncology History Overview Note  # April 26th, 2023-  NASOPHARYNGEAL MASS; ENDOSCOPIC BIOPSY:  - POORLY DIFFERENTIATED CARCINOMA, WITH SQUAMOUS DIFFERENTIATION AND  KERATINIZATION. EBV-NEGATIVE. [Dr.Bennett: ]  Comment:  Immunohistochemical studies show tumor cells to be strongly and  diffusely positive for CK5/6 and p40, and negative for S100, CD45 (LCA),  desmin, and p16  #  PET sacn May 5th, 2023-  Posterior nasopharyngeal mass maximum SUV 12.7, compatible with malignancy; Hypermetabolic right level IIb and right level IV lymph nodes compatible with malignant spread. 3. A 4 mm in short axis left level IIb lymph node has a maximum SUV of 2.1 which is about at the level of the blood pool, probably not involved. 4. Other imaging findings of potential clinical significance: Aortic Atherosclerosis (ICD10-I70.0). Coronary and systemic atherosclerosis. Emphysema (ICD10-J43.9). Sigmoid colon diverticulosis. Scoliosis.     Electronically Signed   By: Van Clines M.D.   On: 08/08/2021 10:56  # STAGE II nasopharyngeal carcinoma [Dr.Bennett];   # MAY 23rd- cisplatin weekly-RT; 5/25- RT   Nasopharyngeal carcinoma ([place])  08/11/2021 Initial Diagnosis   Nasopharyngeal carcinoma ([place])   08/11/2021 Cancer Staging   Staging form: Pharynx - Nasopharynx, AJCC 8th Edition - Clinical: Stage II (cT2, cN1, cM0) - Signed by Cammie Sickle, MD on 08/11/2021 Stage prefix: Initial diagnosis   08/26/2021 -  Chemotherapy   Patient is on Treatment Plan : HEAD/NECK Cisplatin q7d + XRT x 6 Cycles / Cisplatin D1 + 5FU IVCI D1-4 q28d x 3 Cycles        HISTORY OF PRESENTING ILLNESS: Alone.  Ambulating independently.  Vickie Caldwell 71 y.o.   female newly diagnosed nasopharyngeal carcinoma; chronic hyponatremia is currently on weekly cisplatin with radiation.   Following up today for labs and fluids. At her last visit she reported that she was not taking her liquid potassium supplement due to trouble opening the bottle. This has been fixed and she now has a correct measuring cup as well. Due to having to wake up so early for today's appointment she has not taken this medication yet.   She reports that other than being a bit tired from waking up so early she is feeling well. No chest pain, palpitations, dizziness, fatigue, SOB.   Review of Systems  Constitutional:  Positive for malaise/fatigue. Negative for chills, diaphoresis and fever.  HENT:  Negative for nosebleeds and sore throat.   Eyes:  Negative for double vision.  Respiratory:  Negative for cough, hemoptysis, sputum production, shortness of breath and wheezing.   Cardiovascular:  Negative for chest pain, palpitations, orthopnea and leg swelling.  Gastrointestinal:  Negative for abdominal pain, blood in stool, constipation, diarrhea, heartburn, melena, nausea and vomiting.  Genitourinary:  Negative for dysuria, frequency and urgency.  Musculoskeletal:  Positive for back pain and joint pain.  Skin: Negative.  Negative for itching and rash.  Neurological:  Negative for dizziness, tingling, focal weakness, weakness and headaches.  Endo/Heme/Allergies:  Does not bruise/bleed easily.  Psychiatric/Behavioral:  Negative for depression. The patient is not nervous/anxious and does not have insomnia.      MEDICAL HISTORY:  Past Medical History:  Diagnosis Date   Asthma    COPD (chronic obstructive pulmonary disease) (HCC)    Dysrhythmia    GERD (gastroesophageal reflux  disease)    History of kidney stones    Hypertension    Osteoarthritis    TIA (transient ischemic attack) 2011   No Deficits   Vertigo     SURGICAL HISTORY: Past Surgical History:  Procedure Laterality  Date   ABDOMINAL HYSTERECTOMY     BREAST BIOPSY Left 1980's   neg. Pt states punch biopsy at the nipple   IR IMAGING GUIDED PORT INSERTION  08/14/2021   LYMPH NODE DISSECTION Right    neck   MANDIBLE SURGERY     Sagittal split   NASAL ENDOSCOPY Bilateral 07/23/2021   Procedure: NASAL ENDOSCOPY WITH BIOPSY OF NASOPHARYNGEAL MASS;  Surgeon: Clyde Canterbury, MD;  Location: [facility] ORS;  Service: ENT;  Laterality: Bilateral;   THROAT SURGERY     Vocal cord polyps "burned"   TUBAL LIGATION     WISDOM TOOTH EXTRACTION      SOCIAL HISTORY: Social History   Socioeconomic History   Marital status: Single    Spouse name: Not on file   Number of children: Not on file   Years of education: Not on file   Highest education level: Not on file  Occupational History   Not on file  Tobacco Use   Smoking status: Every Day    Packs/day: 1.00    Years: 51.00    Total pack years: 51.00    Types: Cigarettes   Smokeless tobacco: Never   Tobacco comments:    Started smoking around age 60  Vaping Use   Vaping Use: Never used  Substance and Sexual Activity   Alcohol use: Yes    Alcohol/week: 1.0 standard drink of alcohol    Types: 1 Cans of beer per week    Comment: occasional   Drug use: Never   Sexual activity: Not Currently    Birth control/protection: None  Other Topics Concern   Not on file  Social History Narrative   Smoker; sometimes beer/wine; used to work for Therapist, 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 With daughter-ZES//brother. From [place].    Social Determinants of Health   Financial Resource Strain: Not on file  Food Insecurity: Not on file  Transportation Needs: No Transportation Needs (10/02/2021)   PRAPARE - Hydrologist (Medical): No    Lack of Transportation (Non-Medical): No  Physical Activity: Not on file  Stress: Not on file  Social Connections: Not on file  Intimate Partner Violence: Not on file    FAMILY  HISTORY: Family History  Problem Relation Age of Onset   Coronary artery disease Mother    Cancer Father 31       Oral   Cancer Brother 74       testicular   Prostate cancer Brother    Heart failure Brother    Breast cancer Neg Hx     ALLERGIES:  is allergic to codeine and penicillins.  MEDICATIONS:  Current Outpatient Medications  Medication Sig Dispense Refill   acetaminophen (TYLENOL) 325 MG tablet Take 650 mg by mouth every 6 (six) hours as needed.     albuterol (VENTOLIN HFA) 108 (90 Base) MCG/ACT inhaler Inhale into the lungs every 6 (six) hours as needed for wheezing or shortness of breath.     amLODipine (NORVASC) 10 MG tablet Take 10 mg by mouth daily.     ASPIRIN 81 PO Take by mouth daily.     Cholecalciferol (VITAMIN D3 PO) Take by mouth daily.     diazepam (VALIUM)  5 MG tablet Take 1 tablet (5 mg total) by mouth every 12 (twelve) hours as needed. 60 tablet 0   famotidine (PEPCID) 10 MG tablet Take 20 mg by mouth daily.     fluticasone (FLONASE) 50 MCG/ACT nasal spray Place into both nostrils daily.     HYDROcodone-acetaminophen (NORCO) 10-325 MG tablet Take 1 tablet by mouth every 6 (six) hours as needed. 45 tablet 0   lactulose (CHRONULAC) 10 GM/15ML solution Take 15 mLs (10 g total) by mouth 2 (two) times daily as needed for moderate constipation. 236 mL 0   lidocaine-prilocaine (EMLA) cream Apply on the port. 30 -45 min  prior to port access. 30 g 3   metoprolol succinate (TOPROL-XL) 100 MG 24 hr tablet Take 100 mg by mouth daily. Take with or immediately following a meal.     montelukast (SINGULAIR) 10 MG tablet Take 10 mg by mouth daily.     morphine (MS CONTIN) 30 MG 12 hr tablet Take 1 tablet (30 mg total) by mouth every 12 (twelve) hours. 60 tablet 0   ondansetron (ZOFRAN) 8 MG tablet One pill every 8 hours as needed for nausea/vomitting. 40 tablet 1   Oxycodone HCl 10 MG TABS Take 1 tablet (10 mg total) by mouth every 4 (four) hours as needed. 84 tablet 0    potassium chloride 20 MEQ/15ML (10%) SOLN Take 15 mLs (20 mEq total) by mouth 3 (three) times daily. 473 mL 2   potassium chloride SA (KLOR-CON M) 20 MEQ tablet 1 pill twice a day 60 tablet 3   promethazine (PHENERGAN) 25 MG tablet Take 0.5 tablets (12.5 mg total) by mouth every 8 (eight) hours as needed for refractory nausea / vomiting. 30 tablet 0   umeclidinium bromide (INCRUSE ELLIPTA) 62.5 MCG/ACT AEPB Inhale 1 puff into the lungs daily.     Current Facility-Administered Medications  Medication Dose Route Frequency Provider Last Rate Last Admin   potassium chloride 20 mEq in 100 mL IVPB  20 mEq Intravenous Once Cammie Sickle, MD 100 mL/hr at 10/03/21 0933 20 mEq at 10/03/21 0933   potassium chloride 20 mEq in 100 mL IVPB  20 mEq Intravenous Once Merisa Julio M, PA-C       Facility-Administered Medications Ordered in Other Visits  Medication Dose Route Frequency Provider Last Rate Last Admin   heparin lock flush 100 UNIT/ML injection              PHYSICAL EXAMINATION: ECOG PERFORMANCE STATUS: 1 - Symptomatic but completely ambulatory  Vitals:   10/03/21 0844  BP: 129/60  Pulse: 74  Resp: 16  Temp: 98.1 F (36.7 C)  SpO2: 99%   Filed Weights   10/03/21 0844  Weight: 98 lb 14 oz (44.8 kg)   Approximately 2-3 cm lymph node noted in the right submandibular region.  Multiple oral ulcers noted.  Physical Exam Vitals and nursing note reviewed.  HENT:     Head: Normocephalic and atraumatic.     Mouth/Throat:     Pharynx: Oropharynx is clear.  Eyes:     Extraocular Movements: Extraocular movements intact.     Pupils: Pupils are equal, round, and reactive to light.  Cardiovascular:     Rate and Rhythm: Normal rate and regular rhythm.  Pulmonary:     Comments: Decreased breath sounds bilaterally.  Abdominal:     Palpations: Abdomen is soft.  Musculoskeletal:        General: Normal range of motion.     Cervical back:  Normal range of motion.  Skin:    General:  Skin is warm.  Neurological:     General: No focal deficit present.     Mental Status: She is alert and oriented to person, place, and time.  Psychiatric:        Behavior: Behavior normal.        Judgment: Judgment normal.      LABORATORY DATA:  I have reviewed the data as listed Lab Results  Component Value Date   WBC 12.6 (H) 10/03/2021   HGB 9.5 (L) 10/03/2021   HCT 28.6 (L) 10/03/2021   MCV 84.6 10/03/2021   PLT 416 (H) 10/03/2021   Recent Labs    09/25/21 0959 10/02/21 0944 10/03/21 0838  NA 118* 121* 122*  K 3.3* 2.7* 2.7*  CL 82* 82* 84*  CO2 '26 27 28  '$ GLUCOSE 139* 130* 132*  BUN 7* <5* <5*  CREATININE 0.35* 0.43* 0.33*  CALCIUM 8.5* 8.6* 8.6*  GFRNONAA >60 >60 >60  PROT 7.1 6.7 6.6  ALBUMIN 3.1* 2.8* 2.6*  AST '19 25 21  '$ ALT '18 24 23  '$ ALKPHOS 98 96 93  BILITOT 0.4 0.5 0.6     RADIOGRAPHIC STUDIES: I have personally reviewed the radiological images as listed and agreed with the findings in the report. No results found.  ASSESSMENT & PLAN:  Encounter Diagnoses  Name Primary?   Nasopharyngeal carcinoma ([place]) Yes   Hypokalemia    Improving with treatment and radiation. Cycle 5 has been held due to electrolyte imbalance.  2. Acute on chronic. [place] today IV. Start home supplementation. Follow up Monday for fluids, likely K. Reviewed red flags.    All questions were answered. The patient knows to call the clinic with any problems, questions or concerns.   Hughie Closs, PA-C 10/03/2021 9:35 AM

## 2021-10-06 ENCOUNTER — Inpatient Hospital Stay: Payer: Medicare HMO | Attending: Internal Medicine

## 2021-10-06 ENCOUNTER — Other Ambulatory Visit: Payer: Self-pay

## 2021-10-06 ENCOUNTER — Inpatient Hospital Stay: Payer: Medicare HMO

## 2021-10-06 ENCOUNTER — Ambulatory Visit
Admission: RE | Admit: 2021-10-06 | Discharge: 2021-10-06 | Disposition: A | Discharge status: Home / Self Care | Payer: Medicare HMO | Source: Ambulatory Visit | Attending: Radiation Oncology | Admitting: Radiation Oncology

## 2021-10-06 VITALS — BP 111/58 | HR 78 | Temp 98.0°F | Resp 16

## 2021-10-06 DIAGNOSIS — C119 Malignant neoplasm of nasopharynx, unspecified: Secondary | ICD-10-CM | POA: Insufficient documentation

## 2021-10-06 DIAGNOSIS — E878 Other disorders of electrolyte and fluid balance, not elsewhere classified: Secondary | ICD-10-CM | POA: Insufficient documentation

## 2021-10-06 DIAGNOSIS — Z79899 Other long term (current) drug therapy: Secondary | ICD-10-CM | POA: Diagnosis not present

## 2021-10-06 DIAGNOSIS — E871 Hypo-osmolality and hyponatremia: Secondary | ICD-10-CM | POA: Diagnosis not present

## 2021-10-06 DIAGNOSIS — F17210 Nicotine dependence, cigarettes, uncomplicated: Secondary | ICD-10-CM | POA: Diagnosis not present

## 2021-10-06 DIAGNOSIS — Z510 Encounter for antineoplastic radiation therapy: Secondary | ICD-10-CM | POA: Diagnosis not present

## 2021-10-06 DIAGNOSIS — R1310 Dysphagia, unspecified: Secondary | ICD-10-CM | POA: Insufficient documentation

## 2021-10-06 LAB — CBC WITH DIFFERENTIAL/PLATELET
Abs Immature Granulocytes: 0.15 10*3/uL — ABNORMAL HIGH (ref 0.00–0.07)
Basophils Absolute: 0 10*3/uL (ref 0.0–0.1)
Basophils Relative: 0 %
Eosinophils Absolute: 0 10*3/uL (ref 0.0–0.5)
Eosinophils Relative: 0 %
HCT: 26.3 % — ABNORMAL LOW (ref 36.0–46.0)
Hemoglobin: 8.8 g/dL — ABNORMAL LOW (ref 12.0–15.0)
Immature Granulocytes: 1 %
Lymphocytes Relative: 2 %
Lymphs Abs: 0.2 10*3/uL — ABNORMAL LOW (ref 0.7–4.0)
MCH: 28 pg (ref 26.0–34.0)
MCHC: 33.5 g/dL (ref 30.0–36.0)
MCV: 83.8 fL (ref 80.0–100.0)
Monocytes Absolute: 1.5 10*3/uL — ABNORMAL HIGH (ref 0.1–1.0)
Monocytes Relative: 12 %
Neutro Abs: 10.2 10*3/uL — ABNORMAL HIGH (ref 1.7–7.7)
Neutrophils Relative %: 85 %
Platelets: 446 10*3/uL — ABNORMAL HIGH (ref 150–400)
RBC: 3.14 MIL/uL — ABNORMAL LOW (ref 3.87–5.11)
RDW: 13 % (ref 11.5–15.5)
WBC: 12.1 10*3/uL — ABNORMAL HIGH (ref 4.0–10.5)
nRBC: 0 % (ref 0.0–0.2)

## 2021-10-06 LAB — RAD ONC ARIA SESSION SUMMARY
Course Elapsed Days: 39
Plan Fractions Treated to Date: 26
Plan Prescribed Dose Per Fraction: 2 Gy
Plan Total Fractions Prescribed: 35
Plan Total Prescribed Dose: 70 Gy
Reference Point Dosage Given to Date: 52 Gy
Reference Point Session Dosage Given: 2 Gy
Session Number: 26

## 2021-10-06 LAB — COMPREHENSIVE METABOLIC PANEL WITH GFR
ALT: 26 U/L (ref 0–44)
AST: 25 U/L (ref 15–41)
Albumin: 2.6 g/dL — ABNORMAL LOW (ref 3.5–5.0)
Alkaline Phosphatase: 94 U/L (ref 38–126)
Anion gap: 13 (ref 5–15)
BUN: 9 mg/dL (ref 8–23)
CO2: 27 mmol/L (ref 22–32)
Calcium: 8.6 mg/dL — ABNORMAL LOW (ref 8.9–10.3)
Chloride: 80 mmol/L — ABNORMAL LOW (ref 98–111)
Creatinine, Ser: 0.36 mg/dL — ABNORMAL LOW (ref 0.44–1.00)
GFR, Estimated: >60 mL/min
Glucose, Bld: 126 mg/dL — ABNORMAL HIGH (ref 70–99)
Potassium: 3.2 mmol/L — ABNORMAL LOW (ref 3.5–5.1)
Sodium: 120 mmol/L — ABNORMAL LOW (ref 135–145)
Total Bilirubin: 0.7 mg/dL (ref 0.3–1.2)
Total Protein: 6.4 g/dL — ABNORMAL LOW (ref 6.5–8.1)

## 2021-10-06 LAB — MAGNESIUM: Magnesium: 1.7 mg/dL (ref 1.7–2.4)

## 2021-10-06 MED ORDER — POTASSIUM CHLORIDE 20 MEQ/100ML IV SOLN
20.0000 meq | Freq: Once | INTRAVENOUS | Status: AC
  Administered 2021-10-06: 20 meq via INTRAVENOUS

## 2021-10-06 MED ORDER — HEPARIN SOD (PORK) LOCK FLUSH 100 UNIT/ML IV SOLN
500.0000 [IU] | Freq: Once | INTRAVENOUS | Status: AC | PRN
  Administered 2021-10-06: 500 [IU]
  Filled 2021-10-06: qty 5

## 2021-10-06 MED ORDER — SODIUM CHLORIDE 0.9 % IV SOLN
Freq: Once | INTRAVENOUS | Status: AC
  Filled 2021-10-06: qty 250

## 2021-10-08 ENCOUNTER — Other Ambulatory Visit: Payer: Self-pay

## 2021-10-08 ENCOUNTER — Inpatient Hospital Stay: Payer: Medicare HMO

## 2021-10-08 ENCOUNTER — Telehealth: Payer: Self-pay

## 2021-10-08 ENCOUNTER — Ambulatory Visit
Admission: RE | Admit: 2021-10-08 | Discharge: 2021-10-08 | Disposition: A | Discharge status: Home / Self Care | Payer: Medicare HMO | Source: Ambulatory Visit | Attending: Radiation Oncology | Admitting: Radiation Oncology

## 2021-10-08 DIAGNOSIS — Z510 Encounter for antineoplastic radiation therapy: Secondary | ICD-10-CM | POA: Diagnosis not present

## 2021-10-08 DIAGNOSIS — C119 Malignant neoplasm of nasopharynx, unspecified: Secondary | ICD-10-CM

## 2021-10-08 LAB — RAD ONC ARIA SESSION SUMMARY
Course Elapsed Days: 41
Plan Fractions Treated to Date: 27
Plan Prescribed Dose Per Fraction: 2 Gy
Plan Total Fractions Prescribed: 35
Plan Total Prescribed Dose: 70 Gy
Reference Point Dosage Given to Date: 54 Gy
Reference Point Session Dosage Given: 2 Gy
Session Number: 27

## 2021-10-08 NOTE — Telephone Encounter (Signed)
Patients daughter Butch Penny) calling stating that the patient needs refills on her pain medication and that the pain medication was supposed to be switched over to a liquid form because she is having trouble swallowing.  Call back # [phone]

## 2021-10-09 ENCOUNTER — Ambulatory Visit: Payer: Medicare HMO

## 2021-10-09 ENCOUNTER — Inpatient Hospital Stay: Payer: Medicare HMO

## 2021-10-09 ENCOUNTER — Inpatient Hospital Stay (HOSPITAL_BASED_OUTPATIENT_CLINIC_OR_DEPARTMENT_OTHER): Payer: Medicare HMO | Admitting: Medical Oncology

## 2021-10-09 ENCOUNTER — Inpatient Hospital Stay
Admission: EM | Admit: 2021-10-09 | Discharge: 2021-11-04 | DRG: 643 | Disposition: E | Payer: Medicare HMO | Source: Ambulatory Visit | Attending: Internal Medicine | Admitting: Internal Medicine

## 2021-10-09 ENCOUNTER — Other Ambulatory Visit: Payer: Self-pay

## 2021-10-09 ENCOUNTER — Emergency Department: Payer: Medicare HMO

## 2021-10-09 VITALS — BP 118/68 | HR 99 | Temp 97.9°F | Resp 18 | Wt 93.0 lb

## 2021-10-09 DIAGNOSIS — C119 Malignant neoplasm of nasopharynx, unspecified: Secondary | ICD-10-CM | POA: Diagnosis present

## 2021-10-09 DIAGNOSIS — D630 Anemia in neoplastic disease: Secondary | ICD-10-CM | POA: Diagnosis present

## 2021-10-09 DIAGNOSIS — I110 Hypertensive heart disease with heart failure: Secondary | ICD-10-CM | POA: Diagnosis present

## 2021-10-09 DIAGNOSIS — I82890 Acute embolism and thrombosis of other specified veins: Secondary | ICD-10-CM | POA: Diagnosis not present

## 2021-10-09 DIAGNOSIS — Z885 Allergy status to narcotic agent status: Secondary | ICD-10-CM

## 2021-10-09 DIAGNOSIS — Z66 Do not resuscitate: Secondary | ICD-10-CM | POA: Diagnosis not present

## 2021-10-09 DIAGNOSIS — E43 Unspecified severe protein-calorie malnutrition: Secondary | ICD-10-CM | POA: Diagnosis present

## 2021-10-09 DIAGNOSIS — Z8673 Personal history of transient ischemic attack (TIA), and cerebral infarction without residual deficits: Secondary | ICD-10-CM

## 2021-10-09 DIAGNOSIS — E861 Hypovolemia: Secondary | ICD-10-CM | POA: Diagnosis present

## 2021-10-09 DIAGNOSIS — E876 Hypokalemia: Secondary | ICD-10-CM | POA: Diagnosis present

## 2021-10-09 DIAGNOSIS — R042 Hemoptysis: Secondary | ICD-10-CM | POA: Diagnosis not present

## 2021-10-09 DIAGNOSIS — E222 Syndrome of inappropriate secretion of antidiuretic hormone: Secondary | ICD-10-CM | POA: Diagnosis present

## 2021-10-09 DIAGNOSIS — H9190 Unspecified hearing loss, unspecified ear: Secondary | ICD-10-CM | POA: Diagnosis present

## 2021-10-09 DIAGNOSIS — J9601 Acute respiratory failure with hypoxia: Secondary | ICD-10-CM | POA: Diagnosis not present

## 2021-10-09 DIAGNOSIS — Z880 Allergy status to penicillin: Secondary | ICD-10-CM

## 2021-10-09 DIAGNOSIS — J392 Other diseases of pharynx: Secondary | ICD-10-CM | POA: Diagnosis present

## 2021-10-09 DIAGNOSIS — Z79899 Other long term (current) drug therapy: Secondary | ICD-10-CM

## 2021-10-09 DIAGNOSIS — K219 Gastro-esophageal reflux disease without esophagitis: Secondary | ICD-10-CM | POA: Diagnosis present

## 2021-10-09 DIAGNOSIS — I10 Essential (primary) hypertension: Secondary | ICD-10-CM | POA: Diagnosis present

## 2021-10-09 DIAGNOSIS — Z978 Presence of other specified devices: Secondary | ICD-10-CM

## 2021-10-09 DIAGNOSIS — Y842 Radiological procedure and radiotherapy as the cause of abnormal reaction of the patient, or of later complication, without mention of misadventure at the time of the procedure: Secondary | ICD-10-CM | POA: Diagnosis present

## 2021-10-09 DIAGNOSIS — Z8042 Family history of malignant neoplasm of prostate: Secondary | ICD-10-CM

## 2021-10-09 DIAGNOSIS — I5032 Chronic diastolic (congestive) heart failure: Secondary | ICD-10-CM | POA: Diagnosis present

## 2021-10-09 DIAGNOSIS — R58 Hemorrhage, not elsewhere classified: Secondary | ICD-10-CM | POA: Diagnosis not present

## 2021-10-09 DIAGNOSIS — I6523 Occlusion and stenosis of bilateral carotid arteries: Secondary | ICD-10-CM | POA: Diagnosis present

## 2021-10-09 DIAGNOSIS — I445 Left posterior fascicular block: Secondary | ICD-10-CM | POA: Diagnosis present

## 2021-10-09 DIAGNOSIS — Z9911 Dependence on respirator [ventilator] status: Secondary | ICD-10-CM

## 2021-10-09 DIAGNOSIS — I700 Atherosclerosis of aorta: Secondary | ICD-10-CM | POA: Diagnosis present

## 2021-10-09 DIAGNOSIS — J432 Centrilobular emphysema: Secondary | ICD-10-CM | POA: Diagnosis present

## 2021-10-09 DIAGNOSIS — I471 Supraventricular tachycardia: Secondary | ICD-10-CM | POA: Diagnosis present

## 2021-10-09 DIAGNOSIS — H9193 Unspecified hearing loss, bilateral: Secondary | ICD-10-CM

## 2021-10-09 DIAGNOSIS — Z95828 Presence of other vascular implants and grafts: Secondary | ICD-10-CM

## 2021-10-09 DIAGNOSIS — H547 Unspecified visual loss: Secondary | ICD-10-CM

## 2021-10-09 DIAGNOSIS — I959 Hypotension, unspecified: Secondary | ICD-10-CM | POA: Diagnosis present

## 2021-10-09 DIAGNOSIS — Z7982 Long term (current) use of aspirin: Secondary | ICD-10-CM

## 2021-10-09 DIAGNOSIS — Z923 Personal history of irradiation: Secondary | ICD-10-CM

## 2021-10-09 DIAGNOSIS — K1230 Oral mucositis (ulcerative), unspecified: Secondary | ICD-10-CM | POA: Diagnosis present

## 2021-10-09 DIAGNOSIS — Z515 Encounter for palliative care: Secondary | ICD-10-CM

## 2021-10-09 DIAGNOSIS — J381 Polyp of vocal cord and larynx: Secondary | ICD-10-CM | POA: Diagnosis present

## 2021-10-09 DIAGNOSIS — R112 Nausea with vomiting, unspecified: Secondary | ICD-10-CM | POA: Diagnosis present

## 2021-10-09 DIAGNOSIS — F17200 Nicotine dependence, unspecified, uncomplicated: Secondary | ICD-10-CM | POA: Diagnosis present

## 2021-10-09 DIAGNOSIS — R1310 Dysphagia, unspecified: Secondary | ICD-10-CM | POA: Diagnosis not present

## 2021-10-09 DIAGNOSIS — R197 Diarrhea, unspecified: Secondary | ICD-10-CM | POA: Diagnosis present

## 2021-10-09 DIAGNOSIS — F17210 Nicotine dependence, cigarettes, uncomplicated: Secondary | ICD-10-CM | POA: Diagnosis present

## 2021-10-09 DIAGNOSIS — Z6821 Body mass index (BMI) 21.0-21.9, adult: Secondary | ICD-10-CM

## 2021-10-09 DIAGNOSIS — E871 Hypo-osmolality and hyponatremia: Secondary | ICD-10-CM | POA: Diagnosis present

## 2021-10-09 DIAGNOSIS — I4510 Unspecified right bundle-branch block: Secondary | ICD-10-CM | POA: Diagnosis present

## 2021-10-09 DIAGNOSIS — E860 Dehydration: Secondary | ICD-10-CM | POA: Diagnosis present

## 2021-10-09 DIAGNOSIS — I720 Aneurysm of carotid artery: Secondary | ICD-10-CM | POA: Diagnosis present

## 2021-10-09 DIAGNOSIS — I4891 Unspecified atrial fibrillation: Secondary | ICD-10-CM | POA: Diagnosis not present

## 2021-10-09 DIAGNOSIS — T451X5A Adverse effect of antineoplastic and immunosuppressive drugs, initial encounter: Secondary | ICD-10-CM | POA: Diagnosis present

## 2021-10-09 DIAGNOSIS — Z87442 Personal history of urinary calculi: Secondary | ICD-10-CM

## 2021-10-09 DIAGNOSIS — C8594 Non-Hodgkin lymphoma, unspecified, lymph nodes of axilla and upper limb: Secondary | ICD-10-CM | POA: Diagnosis present

## 2021-10-09 DIAGNOSIS — Z8249 Family history of ischemic heart disease and other diseases of the circulatory system: Secondary | ICD-10-CM

## 2021-10-09 DIAGNOSIS — M1990 Unspecified osteoarthritis, unspecified site: Secondary | ICD-10-CM | POA: Diagnosis present

## 2021-10-09 DIAGNOSIS — G893 Neoplasm related pain (acute) (chronic): Secondary | ICD-10-CM

## 2021-10-09 DIAGNOSIS — Z9221 Personal history of antineoplastic chemotherapy: Secondary | ICD-10-CM

## 2021-10-09 DIAGNOSIS — Z7951 Long term (current) use of inhaled steroids: Secondary | ICD-10-CM

## 2021-10-09 LAB — COMPREHENSIVE METABOLIC PANEL
ALT: 31 U/L (ref 0–44)
AST: 33 U/L (ref 15–41)
Albumin: 2.6 g/dL — ABNORMAL LOW (ref 3.5–5.0)
Alkaline Phosphatase: 109 U/L (ref 38–126)
Anion gap: 13 (ref 5–15)
BUN: 10 mg/dL (ref 8–23)
CO2: 28 mmol/L (ref 22–32)
Calcium: 8.4 mg/dL — ABNORMAL LOW (ref 8.9–10.3)
Chloride: 76 mmol/L — ABNORMAL LOW (ref 98–111)
Creatinine, Ser: 0.42 mg/dL — ABNORMAL LOW (ref 0.44–1.00)
GFR, Estimated: >60 mL/min (ref >60)
Glucose, Bld: 158 mg/dL — ABNORMAL HIGH (ref 70–99)
Potassium: 2.7 mmol/L — CL (ref 3.5–5.1)
Sodium: 117 mmol/L — CL (ref 135–145)
Total Bilirubin: 0.6 mg/dL (ref 0.3–1.2)
Total Protein: 6.6 g/dL (ref 6.5–8.1)

## 2021-10-09 LAB — CBC WITH DIFFERENTIAL/PLATELET
Abs Immature Granulocytes: 0.13 10*3/uL — ABNORMAL HIGH (ref 0.00–0.07)
Basophils Absolute: 0 10*3/uL (ref 0.0–0.1)
Basophils Relative: 0 %
Eosinophils Absolute: 0 10*3/uL (ref 0.0–0.5)
Eosinophils Relative: 0 %
HCT: 29 % — ABNORMAL LOW (ref 36.0–46.0)
Hemoglobin: 9.7 g/dL — ABNORMAL LOW (ref 12.0–15.0)
Immature Granulocytes: 1 %
Lymphocytes Relative: 1 %
Lymphs Abs: 0.2 10*3/uL — ABNORMAL LOW (ref 0.7–4.0)
MCH: 27.6 pg (ref 26.0–34.0)
MCHC: 33.4 g/dL (ref 30.0–36.0)
MCV: 82.6 fL (ref 80.0–100.0)
Monocytes Absolute: 1.4 10*3/uL — ABNORMAL HIGH (ref 0.1–1.0)
Monocytes Relative: 10 %
Neutro Abs: 12.4 10*3/uL — ABNORMAL HIGH (ref 1.7–7.7)
Neutrophils Relative %: 88 %
Platelets: 512 10*3/uL — ABNORMAL HIGH (ref 150–400)
RBC: 3.51 MIL/uL — ABNORMAL LOW (ref 3.87–5.11)
RDW: 12.9 % (ref 11.5–15.5)
WBC: 14.1 10*3/uL — ABNORMAL HIGH (ref 4.0–10.5)
nRBC: 0 % (ref 0.0–0.2)

## 2021-10-09 LAB — URINALYSIS, COMPLETE (UACMP) WITH MICROSCOPIC
Bilirubin Urine: NEGATIVE
Glucose, UA: NEGATIVE mg/dL
Hgb urine dipstick: NEGATIVE
Ketones, ur: 5 mg/dL — AB
Leukocytes,Ua: NEGATIVE
Nitrite: NEGATIVE
Protein, ur: NEGATIVE mg/dL
Specific Gravity, Urine: 1.003 — ABNORMAL LOW (ref 1.005–1.030)
pH: 9 — ABNORMAL HIGH (ref 5.0–8.0)

## 2021-10-09 LAB — OSMOLALITY, URINE: Osmolality, Ur: 171 mOsm/kg — ABNORMAL LOW (ref 300–900)

## 2021-10-09 LAB — OSMOLALITY: Osmolality: 252 mOsm/kg — ABNORMAL LOW (ref 275–295)

## 2021-10-09 LAB — MRSA NEXT GEN BY PCR, NASAL: MRSA by PCR Next Gen: NOT DETECTED

## 2021-10-09 LAB — SODIUM
Sodium: 124 mmol/L — ABNORMAL LOW (ref 135–145)
Sodium: 122 mmol/L — ABNORMAL LOW (ref 135–145)
Sodium: 120 mmol/L — ABNORMAL LOW (ref 135–145)

## 2021-10-09 LAB — HIV ANTIBODY (ROUTINE TESTING W REFLEX): HIV Screen 4th Generation wRfx: NONREACTIVE

## 2021-10-09 LAB — SODIUM, URINE, RANDOM: Sodium, Ur: 45 mmol/L

## 2021-10-09 LAB — MAGNESIUM: Magnesium: 1.7 mg/dL (ref 1.7–2.4)

## 2021-10-09 MED ORDER — ENOXAPARIN SODIUM 40 MG/0.4ML IJ SOSY: 40.0000 mg | PREFILLED_SYRINGE | INTRAMUSCULAR | Status: DC

## 2021-10-09 MED ORDER — MORPHINE SULFATE ER 15 MG PO TBCR: 30.0000 mg | EXTENDED_RELEASE_TABLET | Freq: Two times a day (BID) | ORAL | Status: DC

## 2021-10-09 MED ORDER — ACETAMINOPHEN 325 MG PO TABS: 650.0000 mg | ORAL_TABLET | Freq: Four times a day (QID) | ORAL | Status: DC | PRN

## 2021-10-09 MED ORDER — OXYCODONE HCL 5 MG PO TABS
10.0000 mg | ORAL_TABLET | ORAL | Status: DC | PRN
  Administered 2021-10-09 – 2021-10-10 (×2): 10 mg via ORAL
  Filled 2021-10-09 (×2): qty 2

## 2021-10-09 MED ORDER — SODIUM CHLORIDE 3 % IV SOLN
INTRAVENOUS | Status: DC
  Filled 2021-10-09: qty 500

## 2021-10-09 MED ORDER — SODIUM CHLORIDE 0.9% FLUSH
3.0000 mL | Freq: Two times a day (BID) | INTRAVENOUS | Status: DC
Start: 2021-10-09 — End: 2021-10-16
  Administered 2021-10-09 – 2021-10-10 (×4): 3 mL via INTRAVENOUS
  Administered 2021-10-11: 10 mL via INTRAVENOUS
  Administered 2021-10-11: 3 mL via INTRAVENOUS
  Administered 2021-10-12 – 2021-10-13 (×2): 10 mL via INTRAVENOUS
  Administered 2021-10-13 – 2021-10-16 (×6): 3 mL via INTRAVENOUS

## 2021-10-09 MED ORDER — ONDANSETRON HCL 4 MG/2ML IJ SOLN
4.0000 mg | Freq: Four times a day (QID) | INTRAMUSCULAR | Status: DC | PRN
  Administered 2021-10-11 – 2021-10-16 (×6): 4 mg via INTRAVENOUS
  Filled 2021-10-09 (×6): qty 2

## 2021-10-09 MED ORDER — LIDOCAINE-PRILOCAINE 2.5-2.5 % EX CREA: 1.0000 | TOPICAL_CREAM | Freq: Once | CUTANEOUS | Status: DC

## 2021-10-09 MED ORDER — POTASSIUM CHLORIDE 10 MEQ/100ML IV SOLN
10.0000 meq | Freq: Once | INTRAVENOUS | Status: AC
  Administered 2021-10-09: 10 meq via INTRAVENOUS
  Filled 2021-10-09: qty 100

## 2021-10-09 MED ORDER — POTASSIUM CHLORIDE 20 MEQ PO PACK: 40.0000 meq | PACK | Freq: Every day | ORAL | Status: DC

## 2021-10-09 MED ORDER — MAGNESIUM SULFATE 2 GM/50ML IV SOLN
2.0000 g | Freq: Once | INTRAVENOUS | Status: AC
  Administered 2021-10-09: 2 g via INTRAVENOUS
  Filled 2021-10-09: qty 50

## 2021-10-09 MED ORDER — METOPROLOL SUCCINATE ER 50 MG PO TB24
100.0000 mg | ORAL_TABLET | Freq: Every day | ORAL | Status: DC
Start: 2021-10-09 — End: 2021-10-09

## 2021-10-09 MED ORDER — ENOXAPARIN SODIUM 30 MG/0.3ML IJ SOSY
30.0000 mg | PREFILLED_SYRINGE | INTRAMUSCULAR | Status: DC
  Administered 2021-10-09 – 2021-10-15 (×7): 30 mg via SUBCUTANEOUS
  Filled 2021-10-09 (×7): qty 0.3

## 2021-10-09 MED ORDER — SODIUM CHLORIDE 0.9 % IV SOLN: Freq: Once | INTRAVENOUS | Status: AC

## 2021-10-09 MED ORDER — METOPROLOL TARTRATE 25 MG PO TABS
25.0000 mg | ORAL_TABLET | Freq: Two times a day (BID) | ORAL | Status: DC
  Administered 2021-10-09 – 2021-10-10 (×3): 25 mg via ORAL
  Filled 2021-10-09 (×4): qty 1

## 2021-10-09 MED ORDER — SODIUM CHLORIDE 0.9 % IV SOLN
250.0000 mL | INTRAVENOUS | Status: DC | PRN
  Administered 2021-10-12 – 2021-10-14 (×3): 250 mL via INTRAVENOUS

## 2021-10-09 MED ORDER — LACTULOSE 10 GM/15ML PO SOLN: 10.0000 g | Freq: Two times a day (BID) | ORAL | Status: DC | PRN

## 2021-10-09 MED ORDER — ONDANSETRON HCL 4 MG PO TABS
4.0000 mg | ORAL_TABLET | Freq: Four times a day (QID) | ORAL | Status: DC | PRN
  Administered 2021-10-10: 4 mg via ORAL
  Filled 2021-10-09: qty 1

## 2021-10-09 MED ORDER — CHLORHEXIDINE GLUCONATE CLOTH 2 % EX PADS
6.0000 | MEDICATED_PAD | Freq: Every day | CUTANEOUS | Status: DC
  Administered 2021-10-10 – 2021-10-16 (×6): 6 via TOPICAL

## 2021-10-09 MED ORDER — POTASSIUM CHLORIDE 20 MEQ PO PACK
40.0000 meq | PACK | Freq: Two times a day (BID) | ORAL | Status: DC
  Administered 2021-10-09 – 2021-10-12 (×6): 40 meq via ORAL
  Filled 2021-10-09 (×7): qty 2

## 2021-10-09 MED ORDER — MONTELUKAST SODIUM 10 MG PO TABS
10.0000 mg | ORAL_TABLET | Freq: Every day | ORAL | Status: DC
Start: 2021-10-09 — End: 2021-10-16
  Administered 2021-10-09 – 2021-10-12 (×4): 10 mg via ORAL
  Filled 2021-10-09 (×5): qty 1

## 2021-10-09 MED ORDER — METOPROLOL SUCCINATE ER 50 MG PO TB24: 50.0000 mg | ORAL_TABLET | Freq: Every day | ORAL | Status: DC

## 2021-10-09 MED ORDER — FAMOTIDINE 20 MG PO TABS
20.0000 mg | ORAL_TABLET | Freq: Every day | ORAL | Status: DC
  Administered 2021-10-09: 20 mg via ORAL
  Filled 2021-10-09: qty 1

## 2021-10-09 MED ORDER — SODIUM CHLORIDE 0.9 % IV SOLN: INTRAVENOUS | Status: DC

## 2021-10-09 MED ORDER — SODIUM CHLORIDE 0.9% FLUSH
3.0000 mL | INTRAVENOUS | Status: DC | PRN
Start: 2021-10-09 — End: 2021-10-16

## 2021-10-09 MED ORDER — FLUTICASONE PROPIONATE 50 MCG/ACT NA SUSP
1.0000 | Freq: Every day | NASAL | Status: DC
  Administered 2021-10-09 – 2021-10-15 (×7): 1 via NASAL
  Filled 2021-10-09: qty 16

## 2021-10-09 MED ORDER — NICOTINE 7 MG/24HR TD PT24
7.0000 mg | MEDICATED_PATCH | Freq: Every day | TRANSDERMAL | Status: DC
  Administered 2021-10-09 – 2021-10-16 (×8): 7 mg via TRANSDERMAL
  Filled 2021-10-09 (×8): qty 1

## 2021-10-09 MED ORDER — ASPIRIN 81 MG PO CHEW
81.0000 mg | CHEWABLE_TABLET | Freq: Every day | ORAL | Status: DC
  Administered 2021-10-10 – 2021-10-12 (×3): 81 mg via ORAL
  Filled 2021-10-09 (×4): qty 1

## 2021-10-09 MED ORDER — DIAZEPAM 5 MG PO TABS: 5.0000 mg | ORAL_TABLET | Freq: Two times a day (BID) | ORAL | Status: DC | PRN

## 2021-10-09 MED ORDER — UMECLIDINIUM BROMIDE 62.5 MCG/ACT IN AEPB
1.0000 | INHALATION_SPRAY | Freq: Every day | RESPIRATORY_TRACT | Status: DC
  Administered 2021-10-09 – 2021-10-15 (×7): 1 via RESPIRATORY_TRACT
  Filled 2021-10-09 (×2): qty 7

## 2021-10-09 MED ORDER — ALBUTEROL SULFATE (2.5 MG/3ML) 0.083% IN NEBU
3.0000 mL | INHALATION_SOLUTION | Freq: Four times a day (QID) | RESPIRATORY_TRACT | Status: DC | PRN
Start: 2021-10-09 — End: 2021-10-16

## 2021-10-09 NOTE — Progress Notes (Signed)
[place] NOTE  Patient Care Team: Revelo, Elyse Jarvis, MD as PCP - General (Family Medicine) Cammie Sickle, MD as Consulting Physician (Oncology)  CHIEF COMPLAINTS/PURPOSE OF CONSULTATION: head and neck cancer  #  Oncology History Overview Note  # April 26th, 2023-  NASOPHARYNGEAL MASS; ENDOSCOPIC BIOPSY:  - POORLY DIFFERENTIATED CARCINOMA, WITH SQUAMOUS DIFFERENTIATION AND  KERATINIZATION. EBV-NEGATIVE. [Dr.Bennett: ]  Comment:  Immunohistochemical studies show tumor cells to be strongly and  diffusely positive for CK5/6 and p40, and negative for S100, CD45 (LCA),  desmin, and p16  #  PET sacn May 5th, 2023-  Posterior nasopharyngeal mass maximum SUV 12.7, compatible with malignancy; Hypermetabolic right level IIb and right level IV lymph nodes compatible with malignant spread. 3. A 4 mm in short axis left level IIb lymph node has a maximum SUV of 2.1 which is about at the level of the blood pool, probably not involved. 4. Other imaging findings of potential clinical significance: Aortic Atherosclerosis (ICD10-I70.0). Coronary and systemic atherosclerosis. Emphysema (ICD10-J43.9). Sigmoid colon diverticulosis. Scoliosis.     Electronically Signed   By: Van Clines M.D.   On: 08/08/2021 10:56  # STAGE II nasopharyngeal carcinoma [Dr.Bennett];   # MAY 23rd- cisplatin weekly-RT; 5/25- RT   Nasopharyngeal carcinoma ([place])  08/11/2021 Initial Diagnosis   Nasopharyngeal carcinoma ([place])   08/11/2021 Cancer Staging   Staging form: Pharynx - Nasopharynx, AJCC 8th Edition - Clinical: Stage II (cT2, cN1, cM0) - Signed by Cammie Sickle, MD on 08/11/2021 Stage prefix: Initial diagnosis   08/26/2021 -  Chemotherapy   Patient is on Treatment Plan : HEAD/NECK Cisplatin q7d + XRT x 6 Cycles / Cisplatin D1 + 5FU IVCI D1-4 q28d x 3 Cycles        HISTORY OF PRESENTING ILLNESS: Alone.  Ambulating independently.  Vickie Caldwell 71 y.o.   female newly diagnosed nasopharyngeal carcinoma; chronic hyponatremia is currently on weekly cisplatin with radiation.    Electrolyte abnormality: Patient presents by herself. She reports that she feels "loopy". Not confusion. Has lost some hearing, some vision and some ability to swallow over the past 1-2 days. Feeling tired and weak. Unable to tell me if she has been taking her medications.    Review of Systems  Constitutional:  Positive for malaise/fatigue. Negative for chills, diaphoresis and fever.  HENT:  Negative for nosebleeds and sore throat.   Eyes:  Negative for double vision.  Respiratory:  Negative for cough, hemoptysis, sputum production, shortness of breath and wheezing.   Cardiovascular:  Negative for chest pain, palpitations, orthopnea and leg swelling.  Gastrointestinal:  Negative for abdominal pain, blood in stool, constipation, diarrhea, heartburn, melena, nausea and vomiting.  Genitourinary:  Negative for dysuria, frequency and urgency.  Musculoskeletal:  Positive for back pain and joint pain.  Skin: Negative.  Negative for itching and rash.  Neurological:  Negative for dizziness, tingling, focal weakness, weakness and headaches.  Endo/Heme/Allergies:  Does not bruise/bleed easily.  Psychiatric/Behavioral:  Negative for depression. The patient is not nervous/anxious and does not have insomnia.      MEDICAL HISTORY:  Past Medical History:  Diagnosis Date   Asthma    COPD (chronic obstructive pulmonary disease) (HCC)    Dysrhythmia    GERD (gastroesophageal reflux disease)    History of kidney stones    Hypertension    Osteoarthritis    TIA (transient ischemic attack) 2011   No Deficits   Vertigo     SURGICAL HISTORY: Past Surgical History:  Procedure Laterality Date   ABDOMINAL HYSTERECTOMY     BREAST BIOPSY Left 1980's   neg. Pt states punch biopsy at the nipple   IR IMAGING GUIDED PORT INSERTION  08/14/2021   LYMPH NODE DISSECTION Right    neck    MANDIBLE SURGERY     Sagittal split   NASAL ENDOSCOPY Bilateral 07/23/2021   Procedure: NASAL ENDOSCOPY WITH BIOPSY OF NASOPHARYNGEAL MASS;  Surgeon: Clyde Canterbury, MD;  Location: [facility] ORS;  Service: ENT;  Laterality: Bilateral;   THROAT SURGERY     Vocal cord polyps "burned"   TUBAL LIGATION     WISDOM TOOTH EXTRACTION      SOCIAL HISTORY: Social History   Socioeconomic History   Marital status: Single    Spouse name: Not on file   Number of children: Not on file   Years of education: Not on file   Highest education level: Not on file  Occupational History   Not on file  Tobacco Use   Smoking status: Every Day    Packs/day: 1.00    Years: 51.00    Total pack years: 51.00    Types: Cigarettes   Smokeless tobacco: Never   Tobacco comments:    Started smoking around age 6  Vaping Use   Vaping Use: Never used  Substance and Sexual Activity   Alcohol use: Yes    Alcohol/week: 1.0 standard drink of alcohol    Types: 1 Cans of beer per week    Comment: occasional   Drug use: Never   Sexual activity: Not Currently    Birth control/protection: None  Other Topics Concern   Not on file  Social History Narrative   Smoker; sometimes beer/wine; used to work for Therapist, 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 With daughter-ZES//brother. From [place].    Social Determinants of Health   Financial Resource Strain: Not on file  Food Insecurity: Not on file  Transportation Needs: No Transportation Needs (10/02/2021)   PRAPARE - Hydrologist (Medical): No    Lack of Transportation (Non-Medical): No  Physical Activity: Not on file  Stress: Not on file  Social Connections: Not on file  Intimate Partner Violence: Not on file    FAMILY HISTORY: Family History  Problem Relation Age of Onset   Coronary artery disease Mother    Cancer Father 87       Oral   Cancer Brother 62       testicular   Prostate cancer Brother    Heart failure  Brother    Breast cancer Neg Hx     ALLERGIES:  is allergic to codeine and penicillins.  MEDICATIONS:  Current Outpatient Medications  Medication Sig Dispense Refill   acetaminophen (TYLENOL) 325 MG tablet Take 650 mg by mouth every 6 (six) hours as needed.     albuterol (VENTOLIN HFA) 108 (90 Base) MCG/ACT inhaler Inhale into the lungs every 6 (six) hours as needed for wheezing or shortness of breath.     amLODipine (NORVASC) 10 MG tablet Take 10 mg by mouth daily.     ASPIRIN 81 PO Take by mouth daily.     Cholecalciferol (VITAMIN D3 PO) Take by mouth daily.     diazepam (VALIUM) 5 MG tablet Take 1 tablet (5 mg total) by mouth every 12 (twelve) hours as needed. 60 tablet 0   famotidine (PEPCID) 10 MG tablet Take 20 mg by mouth daily.     fluticasone (FLONASE) 50 MCG/ACT  nasal spray Place into both nostrils daily.     HYDROcodone-acetaminophen (NORCO) 10-325 MG tablet Take 1 tablet by mouth every 6 (six) hours as needed. 45 tablet 0   lactulose (CHRONULAC) 10 GM/15ML solution Take 15 mLs (10 g total) by mouth 2 (two) times daily as needed for moderate constipation. 236 mL 0   lidocaine-prilocaine (EMLA) cream Apply on the port. 30 -45 min  prior to port access. 30 g 3   metoprolol succinate (TOPROL-XL) 100 MG 24 hr tablet Take 100 mg by mouth daily. Take with or immediately following a meal.     montelukast (SINGULAIR) 10 MG tablet Take 10 mg by mouth daily.     morphine (MS CONTIN) 30 MG 12 hr tablet Take 1 tablet (30 mg total) by mouth every 12 (twelve) hours. 60 tablet 0   ondansetron (ZOFRAN) 8 MG tablet One pill every 8 hours as needed for nausea/vomitting. 40 tablet 1   Oxycodone HCl 10 MG TABS Take 1 tablet (10 mg total) by mouth every 4 (four) hours as needed. 84 tablet 0   potassium chloride 20 MEQ/15ML (10%) SOLN Take 15 mLs (20 mEq total) by mouth 3 (three) times daily. 473 mL 2   potassium chloride SA (KLOR-CON M) 20 MEQ tablet 1 pill twice a day 60 tablet 3   promethazine  (PHENERGAN) 25 MG tablet Take 0.5 tablets (12.5 mg total) by mouth every 8 (eight) hours as needed for refractory nausea / vomiting. 30 tablet 0   umeclidinium bromide (INCRUSE ELLIPTA) 62.5 MCG/ACT AEPB Inhale 1 puff into the lungs daily.     No current facility-administered medications for this visit.   Facility-Administered Medications Ordered in Other Visits  Medication Dose Route Frequency Provider Last Rate Last Admin   heparin lock flush 100 UNIT/ML injection              PHYSICAL EXAMINATION: ECOG PERFORMANCE STATUS: 1 - Symptomatic but completely ambulatory  Vitals:   10/17/2021 0900  BP: 118/68  Pulse: 99  Resp: 18  Temp: 97.9 F (36.6 C)  SpO2: 99%   Filed Weights   10/30/2021 0900  Weight: 93 lb (42.2 kg)   Approximately 2-3 cm lymph node noted in the right submandibular region.  Multiple oral ulcers noted.  Physical Exam Vitals and nursing note reviewed.  Constitutional:      Appearance: She is ill-appearing.     Comments: Smells of urea   HENT:     Head: Normocephalic and atraumatic.     Mouth/Throat:     Mouth: Mucous membranes are dry.     Pharynx: Oropharynx is clear.  Eyes:     Extraocular Movements: Extraocular movements intact.     Pupils: Pupils are equal, round, and reactive to light.  Cardiovascular:     Rate and Rhythm: Normal rate and regular rhythm.  Pulmonary:     Effort: Pulmonary effort is normal.     Breath sounds: Normal breath sounds. No wheezing.     Comments: Decreased breath sounds bilaterally.  Musculoskeletal:     Cervical back: Normal range of motion.  Skin:    General: Skin is dry.     Coloration: Skin is pale.  Neurological:     General: No focal deficit present.     Mental Status: She is alert and oriented to person, place, and time.  Psychiatric:        Behavior: Behavior normal.        Judgment: Judgment normal.  LABORATORY DATA:  I have reviewed the data as listed Lab Results  Component Value Date   WBC  14.1 (H) 10/29/2021   HGB 9.7 (L) 10/27/2021   HCT 29.0 (L) 10/13/2021   MCV 82.6 10/14/2021   PLT 512 (H) 10/24/2021   Recent Labs    10/03/21 0838 10/06/21 1338 10/04/2021 0854  NA 122* 120* 117*  K 2.7* 3.2* 2.7*  CL 84* 80* 76*  CO2 '28 27 28  '$ GLUCOSE 132* 126* 158*  BUN <5* 9 10  CREATININE 0.33* 0.36* 0.42*  CALCIUM 8.6* 8.6* 8.4*  GFRNONAA >60 >60 >60  PROT 6.6 6.4* 6.6  ALBUMIN 2.6* 2.6* 2.6*  AST 21 25 33  ALT '23 26 31  '$ ALKPHOS 93 94 109  BILITOT 0.6 0.7 0.6     RADIOGRAPHIC STUDIES: I have personally reviewed the radiological images as listed and agreed with the findings in the report. No results found.  ASSESSMENT & PLAN:  No diagnosis found.  Encounter Diagnoses  Name Primary?   Nasopharyngeal carcinoma ([place]) Yes   Hypokalemia    Hyponatremia    Loss of vision    Hearing problem of both ears     Chronic. Cycle 5 has been held due to electrolyte imbalance.  2. Acute on chronic. Sending to ER for correction given clinical picture.  3. Acute on chronic with red flag signs and symptoms. Vitals stable. ER alerted to patients status and upcoming arrival.  4-5. New x 2 days. She will need further work up in [place].    All questions were answered. The patient knows to call the clinic with any problems, questions or concerns.   Hughie Closs, PA-C 10/30/2021 9:48 AM

## 2021-10-09 NOTE — Progress Notes (Signed)
Pt transported to ED as requested by Nelwyn Salisbury, [place]. Family notified.

## 2021-10-09 NOTE — Progress Notes (Signed)
Pt reports that she is losing her hearing, her vision, and her taste. She also reports trouble swallowing due to "dry throat". She also reports trouble taking her medications due to nausea, and endorses diarrhea this morning.

## 2021-10-09 NOTE — H&P (Signed)
History and Physical    Patient: Vickie Caldwell DOB: 05-28-1950 DOA: 10/13/2021 DOS: the patient was seen and examined on 10/21/2021 PCP: Theotis Burrow, MD  Patient coming from: Home  Chief Complaint:  Chief Complaint  Patient presents with   Abnormal Labs   HPI: Vickie Caldwell is a 71 y.o. 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 Patient reports being unable to take her medications due to nausea for several days and complains of diarrhea as well. She states that she has a history of hyponatremia and hypokalemia but her numbers were worse today during her follow-up and so she was sent to the ER for further evaluation. She complains of a dry throat following her radiation treatment and states that she has been asked to cut back on the amount of fluid intake due to her hyponatremia. She denies having any fever, no chills, no cough, no abdominal pain, no urinary symptoms, no leg swelling, no chest pain, no dizziness, no lightheadedness, no blurred vision no focal deficit. Patient noted to have a sodium level of 117, potassium level was 2.7 and a magnesium of 1.6 Discussed with nephrologist, who recommends to start patient on 3% saline.    Review of Systems: As mentioned in the history of present illness. All other systems reviewed and are negative. Past Medical History:  Diagnosis Date   Asthma    COPD (chronic obstructive pulmonary disease) (HCC)    Dysrhythmia    GERD (gastroesophageal reflux disease)    History of kidney stones    Hypertension    Osteoarthritis    TIA (transient ischemic attack) 2011   No Deficits   Vertigo    Past Surgical History:  Procedure Laterality Date   ABDOMINAL  HYSTERECTOMY     BREAST BIOPSY Left 1980's   neg. Pt states punch biopsy at the nipple   IR IMAGING GUIDED PORT INSERTION  08/14/2021   LYMPH NODE DISSECTION Right    neck   MANDIBLE SURGERY     Sagittal split   NASAL ENDOSCOPY Bilateral 07/23/2021   Procedure: NASAL ENDOSCOPY WITH BIOPSY OF NASOPHARYNGEAL MASS;  Surgeon: Clyde Canterbury, MD;  Location: [facility] ORS;  Service: ENT;  Laterality: Bilateral;   THROAT SURGERY     Vocal cord polyps "burned"   TUBAL LIGATION     WISDOM TOOTH EXTRACTION     Social History:  reports that she has been smoking cigarettes. She has a 51.00 pack-year smoking history. She has never used smokeless tobacco. She reports current alcohol use of about 1.0 standard drink of alcohol per week. She reports that she does not use drugs.  Allergies  Allergen Reactions   Codeine Nausea And Vomiting   Penicillins Rash    Family History  Problem Relation Age of Onset   Coronary artery disease Mother    Cancer Father 10       Oral   Cancer Brother 37       testicular   Prostate cancer Brother    Heart failure Brother    Breast cancer Neg Hx     Prior to Admission medications   Medication Sig Start Date End Date Taking? Authorizing Provider  acetaminophen (TYLENOL) 325 MG tablet Take 650 mg by mouth every 6 (six) hours as needed.  [provider]  albuterol (VENTOLIN HFA) 108 (90 Base) MCG/ACT inhaler Inhale into the lungs every 6 (six) hours as needed for wheezing or shortness of breath.    [provider]  amLODipine (NORVASC) 10 MG tablet Take 10 mg by mouth daily.    [provider]  ASPIRIN 81 PO Take by mouth daily.    [provider]  Cholecalciferol (VITAMIN D3 PO) Take by mouth daily.    [provider]  diazepam (VALIUM) 5 MG tablet Take 1 tablet (5 mg total) by mouth every 12 (twelve) hours as needed. 10/01/21   Cammie Sickle, MD  famotidine (PEPCID) 10 MG tablet Take 20 mg by mouth daily.     [provider]  fluticasone (FLONASE) 50 MCG/ACT nasal spray Place into both nostrils daily.    [provider]  HYDROcodone-acetaminophen (NORCO) 10-325 MG tablet Take 1 tablet by mouth every 6 (six) hours as needed. 09/25/21   Cammie Sickle, MD  lactulose (CHRONULAC) 10 GM/15ML solution Take 15 mLs (10 g total) by mouth 2 (two) times daily as needed for moderate constipation. 09/19/21   Verlon Au, NP  lidocaine-prilocaine (EMLA) cream Apply on the port. 30 -45 min  prior to port access. 08/11/21   Cammie Sickle, MD  metoprolol succinate (TOPROL-XL) 100 MG 24 hr tablet Take 100 mg by mouth daily. Take with or immediately following a meal.    [provider]  montelukast (SINGULAIR) 10 MG tablet Take 10 mg by mouth daily.    [provider]  morphine (MS CONTIN) 30 MG 12 hr tablet Take 1 tablet (30 mg total) by mouth every 12 (twelve) hours. 09/12/21   Cammie Sickle, MD  ondansetron (ZOFRAN) 8 MG tablet One pill every 8 hours as needed for nausea/vomitting. 08/11/21   Cammie Sickle, MD  Oxycodone HCl 10 MG TABS Take 1 tablet (10 mg total) by mouth every 4 (four) hours as needed. 09/29/21   Cammie Sickle, MD  potassium chloride 20 MEQ/15ML (10%) SOLN Take 15 mLs (20 mEq total) by mouth 3 (three) times daily. 09/29/21   Cammie Sickle, MD  potassium chloride SA (KLOR-CON M) 20 MEQ tablet 1 pill twice a day 09/25/21   Cammie Sickle, MD  promethazine (PHENERGAN) 25 MG tablet Take 0.5 tablets (12.5 mg total) by mouth every 8 (eight) hours as needed for refractory nausea / vomiting. 09/05/21   Verlon Au, NP  umeclidinium bromide (INCRUSE ELLIPTA) 62.5 MCG/ACT AEPB Inhale 1 puff into the lungs daily.    [provider]    Physical Exam: Vitals:   10/04/2021 1004 11/01/2021 1005  BP:  135/75  Pulse:  90  Resp:  16  Temp:  98.3 F (36.8 C)  TempSrc:  Oral  SpO2:  100%  Weight: 42.2 kg   Height: 4' 8"   (1.422 m)    Physical Exam Vitals and nursing note reviewed.  Constitutional:      Comments: Frail, chronically ill-appearing  HENT:     Head: Normocephalic and atraumatic.     Comments: Temporal wasting    Ears:     Comments: Hard of hearing.    Nose: Nose normal.     Mouth/Throat:     Mouth: Mucous membranes are dry.  Eyes:     Pupils: Pupils are equal, round, and reactive to light.  Cardiovascular:     Rate and Rhythm: Normal rate and regular rhythm.  Pulmonary:  Effort: Pulmonary effort is normal.     Breath sounds: Normal breath sounds.  Abdominal:     General: Abdomen is flat.     Palpations: Abdomen is soft.  Musculoskeletal:        General: Normal range of motion.     Cervical back: Normal range of motion and neck supple.  Skin:    General: Skin is warm and dry.  Neurological:     Mental Status: She is alert.     Motor: Weakness present.  Psychiatric:        Mood and Affect: Mood normal.        Behavior: Behavior normal.     Data Reviewed: Relevant notes from primary care and specialist visits, past discharge summaries as available in EHR, including Care Everywhere. Prior diagnostic testing as pertinent to current admission diagnoses Updated medications and problem lists for reconciliation ED course, including vitals, labs, imaging, treatment and response to treatment Triage notes, nursing and pharmacy notes and ED provider's notes Notable results as noted in HPI Labs reviewed.  Sodium 117, potassium 2.7, chloride 76, bicarb 28, glucose 158, BUN 10, creatinine 0.42, calcium 8.4, total protein 6.6, albumin 2.6, AST 33, ALT 31, alk phos 109, total bilirubin 0.6, total white count 14.1, hemoglobin 9.7, hematocrit 29, platelet count 512 Chest x-ray reviewed by me shows clear lungs. 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  Assessment and  Plan: * Hyponatremia Thought to be due to excessive fluid intake versus SIADH Patient states that she has cut back on her fluid intake as recommended She has worsening hyponatremia, and her sodium level is down to 117 today associated with weakness and some confusion Discussed with nephrology who recommends to start patient on 3% saline Monitor sodium levels every 4 hours Nephrology consult  Hypomagnesemia Most likely related to hypokalemia Supplement magnesium levels  Hypokalemia Noted to have severe hypokalemia Supplement potassium Check magnesium levels  Current smoker ` Smoking cessation discussed with patient in detail We will place her on a nicotine transdermal patch 7 mg daily  Benign essential hypertension Hold amlodipine Continue metoprolol but at one half has scheduled dose, 50 mg daily  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 Follow-up with oncology upon discharge      Advance Care Planning:   Code Status: Full Code   Consults: Nephrology  Family Communication: Greater than 50% of time was spent discussing patient's condition and plan of care with her at the bedside.  All questions and concerns have been addressed.  CODE STATUS was discussed and she is a full code.  Severity of Illness: The appropriate patient status for this patient is INPATIENT. Inpatient status is judged to be reasonable and necessary in order to provide the required intensity of service to ensure the patient's safety. The patient's presenting symptoms, physical exam findings, and initial radiographic and laboratory data in the context of their chronic comorbidities is felt to place them at high risk for further clinical deterioration. Furthermore, it is not anticipated that the patient will be medically stable for discharge from the hospital within 2 midnights of admission.   *  I certify that at the point of admission it is my clinical judgment that the patient will require inpatient hospital care spanning beyond 2 midnights from the point of admission due to  high intensity of service, high risk for further deterioration and high frequency of surveillance required.*  Author: Collier Bullock, MD 10/27/2021 12:44 PM  For on call review [email or web address].

## 2021-10-09 NOTE — Assessment & Plan Note (Addendum)
Smoking cessation discussed with patient in detail  nicotine transdermal patch 7 mg daily

## 2021-10-09 NOTE — Consult Note (Signed)
MEDICATION RELATED CONSULT NOTE   Pharmacy Consult for Hypertonic Saline Infusion Indication: Hyponatremia(chronic)  Allergies  Allergen Reactions   Codeine Nausea And Vomiting   Penicillins Rash    Patient Measurements: Height: '4\' 8"'$  (142.2 cm) Weight: 42.2 kg (93 lb 0.6 oz) IBW/kg (Calculated) : 36.3  Vital Signs: Temp: 98.3 F (36.8 C) (07/06 1005) Temp Source: Oral (07/06 1005) BP: 117/70 (07/06 1300) Pulse Rate: 88 (07/06 1300) Intake/Output from previous day: No intake/output data recorded. Intake/Output from this shift: Total I/O In: 428 [I.V.:328; IV Piggyback:100] Out: -   Labs: Recent Labs    10/07/2021 0854  WBC 14.1*  HGB 9.7*  HCT 29.0*  PLT 512*  CREATININE 0.42*  MG 1.7  ALBUMIN 2.6*  PROT 6.6  AST 33  ALT 31  ALKPHOS 109  BILITOT 0.6   Estimated Creatinine Clearance: 38 mL/min (A) (by C-G formula based on SCr of 0.42 mg/dL (L)).   Microbiology: No results found for this or any previous visit (from the past 720 hour(s)).  Medical History: Past Medical History:  Diagnosis Date   Asthma    COPD (chronic obstructive pulmonary disease) (HCC)    Dysrhythmia    GERD (gastroesophageal reflux disease)    History of kidney stones    Hypertension    Osteoarthritis    TIA (transient ischemic attack) 2011   No Deficits   Vertigo     Medications:  Medications Prior to Admission  Medication Sig Dispense Refill Last Dose   amLODipine (NORVASC) 10 MG tablet Take 10 mg by mouth daily.   Past Week   diazepam (VALIUM) 5 MG tablet Take 1 tablet (5 mg total) by mouth every 12 (twelve) hours as needed. 60 tablet 0 Past Week   fluticasone (FLONASE) 50 MCG/ACT nasal spray Place into both nostrils daily.   Past Week   metoprolol succinate (TOPROL-XL) 100 MG 24 hr tablet Take 100 mg by mouth daily. Take with or immediately following a meal.   Past Week   montelukast (SINGULAIR) 10 MG tablet Take 10 mg by mouth daily.   Past Week   morphine (MS CONTIN) 30  MG 12 hr tablet Take 1 tablet (30 mg total) by mouth every 12 (twelve) hours. 60 tablet 0 Past Week   Oxycodone HCl 10 MG TABS Take 1 tablet (10 mg total) by mouth every 4 (four) hours as needed. 84 tablet 0 Past Week   potassium chloride 20 MEQ/15ML (10%) SOLN Take 15 mLs (20 mEq total) by mouth 3 (three) times daily. 473 mL 2 Past Week   umeclidinium bromide (INCRUSE ELLIPTA) 62.5 MCG/ACT AEPB Inhale 1 puff into the lungs daily.   Past Week   acetaminophen (TYLENOL) 325 MG tablet Take 650 mg by mouth every 6 (six) hours as needed.   prn at prn   albuterol (VENTOLIN HFA) 108 (90 Base) MCG/ACT inhaler Inhale into the lungs every 6 (six) hours as needed for wheezing or shortness of breath.   prn at prn   ASPIRIN 81 PO Take by mouth daily.      Cholecalciferol (VITAMIN D3 PO) Take by mouth daily.      famotidine (PEPCID) 10 MG tablet Take 20 mg by mouth daily.      HYDROcodone-acetaminophen (NORCO) 10-325 MG tablet Take 1 tablet by mouth every 6 (six) hours as needed. 45 tablet 0 prn at prn   lactulose (CHRONULAC) 10 GM/15ML solution Take 15 mLs (10 g total) by mouth 2 (two) times daily as needed for moderate  constipation. 236 mL 0 prn at prn   lidocaine-prilocaine (EMLA) cream Apply on the port. 30 -45 min  prior to port access. 30 g 3 prn at prn   ondansetron (ZOFRAN) 8 MG tablet One pill every 8 hours as needed for nausea/vomitting. 40 tablet 1 prn at prn   potassium chloride SA (KLOR-CON M) 20 MEQ tablet 1 pill twice a day (Patient not taking: Reported on 10/24/2021) 60 tablet 3 Not Taking   promethazine (PHENERGAN) 25 MG tablet Take 0.5 tablets (12.5 mg total) by mouth every 8 (eight) hours as needed for refractory nausea / vomiting. 30 tablet 0 prn at prn   Scheduled:   [START ON 10/10/2021] aspirin  81 mg Oral Daily   enoxaparin (LOVENOX) injection  40 mg Subcutaneous Q24H   famotidine  20 mg Oral Daily   fluticasone  1 spray Each Nare Daily   lidocaine-prilocaine  1 Application Topical Once    metoprolol succinate  50 mg Oral Daily   montelukast  10 mg Oral Daily   morphine  30 mg Oral Q12H   nicotine  7 mg Transdermal Daily   potassium chloride  40 mEq Oral BID   sodium chloride flush  3 mL Intravenous Q12H   umeclidinium bromide  1 puff Inhalation Daily   Infusions:   sodium chloride     magnesium sulfate bolus IVPB     sodium chloride (hypertonic) 30 mL/hr at 10/15/2021 1255   PRN: sodium chloride, acetaminophen, albuterol, diazepam, lactulose, ondansetron **OR** ondansetron (ZOFRAN) IV, oxyCODONE, sodium chloride flush Anti-infectives (From admission, onward)    None       Assessment: Pharmacy has been consulted to assist and monitor hypertonic saline infusion in 71yo patient who was sent to the ER from the cancer center for evaluation of loss of vision, hearing, taste, and difficulty swallowing. Patient has a history of hyponatremia and hypokalemia but lab values were worse today during follow-up visit. Potassium level of 2.7, Magnesium 1.6 and sodium of 117. Thought to initially be d/t excessive fluid intake vs. SIADH, although she states she has cut back on her fluid intake as recommended. Primary team discussed with nephrology, who recommended starting patient on 3% saline.  Date/Time Na Rate 7/6'@1301'$  124 30 ml/h  Goal of Therapy:  Increase Na by 4-6 mEq/L in 4-6 hours  Plan:  - 3% hypertonic saline started'@30ml'$ /hr'@1255'$  on 7/5, but after initial Na level of 124 in the hospital(an increase of 7 in only a few hours), infusion has been stopped.  - recheck Na level q4h currently, with next level'@1700'$ . - contact RN and nephrologist if:  Na increases by 6 mEq/L or more in the first 4 hours  Jolyne Laye A Danyah Guastella 10/23/2021,1:47 PM

## 2021-10-09 NOTE — Assessment & Plan Note (Addendum)
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  Oncology team following while inpatient, including oncology palliative care NP  Pt reports difficulty swallowing -->   changed pain meds and GERD tx to IV  added IV hydralazine prn HTN  based on my discussion w/ patient she is amenable to and is in fact requesting a feeding tube will try to arrange this tomorrow 07/10 (Monday) w/ GI or IR   IR unable to place PEG due to high risk/anatomy, GEN surge consulted but cardiac issues will need to be stabilized before proceeding.  NG tube unlikely to pass d/t anatomy abnormal from mass effect of tumor

## 2021-10-09 NOTE — Progress Notes (Signed)
PHARMACIST - PHYSICIAN COMMUNICATION  CONCERNING:  Enoxaparin (Lovenox) for DVT Prophylaxis    RECOMMENDATION: Patient was prescribed enoxaparin '40mg'$  q24 hours for VTE prophylaxis.   Filed Weights   10/31/2021 1004  Weight: 42.2 kg (93 lb 0.6 oz)    Body mass index is 20.86 kg/m.  Estimated Creatinine Clearance: 38 mL/min (A) (by C-G formula based on SCr of 0.42 mg/dL (L)).  Patient is candidate for enoxaparin '30mg'$  every 24 hours based on CrCl <52m/min or Weight <45kg  DESCRIPTION: [facility]  Patient is now receiving enoxaparin 30 mg every 24 hours   ABenita Gutter7/09/2021 3:04 PM

## 2021-10-09 NOTE — ED Triage Notes (Signed)
Pt here from [facility] with abnormal labs. Pt sodium was 117 and potassium 2.7. Pt having weakness as well.

## 2021-10-09 NOTE — Assessment & Plan Note (Addendum)
Noted to have severe hypokalemia  replete this AM, rechecking now that in Afib  Continue serial BMP  Check magnesium levels

## 2021-10-09 NOTE — Assessment & Plan Note (Addendum)
   Hold home amlodipine  Continue metoprolol but at one half has scheduled dose, 50 mg daily, may not be able to take this po and if so will cancel this   Hydralazine prn HTN

## 2021-10-09 NOTE — Assessment & Plan Note (Addendum)
Most likely related to hypokalemia  Supplement magnesium  Follow levels

## 2021-10-09 NOTE — Assessment & Plan Note (Addendum)
Thought to be due to excessive fluid intake versus SIADH Patient states that she has cut back on her fluid intake as recommended Sodium level is down to 117 on admission 07/06, associated with weakness and some confusion  Nephrology consulted and following closely.  Sodium improving

## 2021-10-09 NOTE — ED Provider Notes (Signed)
[facility] Provider Note    Event Date/Time   First MD Initiated Contact with Patient 11/01/2021 1044     (approximate)   History   Abnormal Labs   HPI  Vickie Caldwell is a 71 y.o. female who was sent from the cancer center from her sodium 117 potassium 2.7.  She also says she is feeling loopy and not really dizzy just not right.  She has chronic hypokalemia and hyponatremia but it is almost never quite this low.  Cancer center note did not say anything about nausea vomiting or diarrhea but there is one note that says she had diarrhea this morning.  That I finally found noted at 10 AM today.      Physical Exam   Triage Vital Signs: ED Triage Vitals  Enc Vitals Group     BP 10/31/2021 1005 135/75     Pulse Rate 10/21/2021 1005 90     Resp 11/03/2021 1005 16     Temp 10/10/2021 1005 98.3 F (36.8 C)     Temp Source 10/04/2021 1005 Oral     SpO2 10/17/2021 1005 100 %     Weight 10/04/2021 1004 93 lb 0.6 oz (42.2 kg)     Height 10/05/2021 1004 '4\' 8"'$  (1.422 m)     Head Circumference --      Peak Flow --      Pain Score 10/26/2021 1004 0     Pain Loc --      Pain Edu? --      Excl. in [place]? --     Most recent vital signs: Vitals:   10/24/2021 1005  BP: 135/75  Pulse: 90  Resp: 16  Temp: 98.3 F (36.8 C)  SpO2: 100%     General: Awake, no distress.  CV:  Good peripheral perfusion.  Heart regular rate and rhythm no audible murmurs Resp:  Normal effort.  Lungs are clear Abd:  No distention.  Soft and nontender Other:  Slight trace edema bilaterally   ED Results / Procedures / Treatments   Labs (all labs ordered are listed, but only abnormal results are displayed) Labs Reviewed  SARS CORONAVIRUS 2 BY RT PCR     EKG  X-ray read and interpreted by me shows normal sinus rhythm rate of 89 rightward axis intraventricular conduction delay approaching right bundle also hemiblock no acute ST-T changes  RADIOLOGY  Chest x-ray read by radiology reviewed  and interpreted by me does not show any obvious disease  PROCEDURES:  Critical Care performed:   Procedures   MEDICATIONS ORDERED IN ED: Medications  potassium chloride 10 mEq in 100 mL IVPB (10 mEq Intravenous New Bag/Given 10/10/2021 1037)  0.9 %  sodium chloride infusion ( Intravenous New Bag/Given 10/06/2021 1023)     IMPRESSION / MDM / ASSESSMENT AND PLAN / ED COURSE  I reviewed the triage vital signs and the nursing notes. Patient with nasopharyngeal carcinoma and history of hyponatremia now with worse hyponatremia hypokalemia and not feeling right.  She is also very deaf.  We will begin giving her some IV saline and IV potassium and probably give her some p.o. potassium as well.  We will watch her in the hospital until she is a little bit better.  Patient's presentation is most consistent with acute complicated illness / injury requiring diagnostic workup.  The patient is on the cardiac monitor to evaluate for evidence of arrhythmia and/or significant heart rate changes.  None have been seen  FINAL CLINICAL IMPRESSION(S) / ED DIAGNOSES   Final diagnoses:  Hyponatremia  Hypokalemia     Rx / DC Orders   ED Discharge Orders     None        Note:  This document was prepared using Dragon voice recognition software and may include unintentional dictation errors.   Nena Polio, MD 10/10/2021 1049

## 2021-10-09 NOTE — Progress Notes (Signed)
Dr. Holley Raring contacted regarding the patients Na+ level decreasing. Current Na+ down to 120. No new orders given at this time.

## 2021-10-09 NOTE — Progress Notes (Signed)
[facility]  ROUNDING NOTE   Subjective:   Vickie Caldwell is a 71 y.o. female with past medical history of COPD, GERD, hypertension, chronic hyponatremia and nasopharyngeal cancer. She presents to the ED at the request of the cancer center for abnormal labs. She has been admitted for Hypokalemia [E87.6] Hyponatremia [E87.1]  Patient is known to our practice and is followed by Dr Candiss Norse outpatient. She was last seen in our office on 09/18/21 for continued hyponatremia follow up. It was determined during that visit that symptoms were likely due to excessive fluid intake with poor oral intake. Pateint was instructed to decrease fluid intake to 1-1.5L daily. Patient is seen and evaluated at the bedside in ICU. No family present and patient very hard of hearing. She states she has been fatigued more than usual for the past week. Continues to have a poor oral intake for solid foods. Admits to restricting her fluid intake but states it is hard due to medications she has ordered that needs to be mixed with liquid. Patient has swallowing difficulties. She denies cough or shortness of breath. Nausea and vomiting are usual.   Sodium 117 and potassium 2.7 on admission. Other labs of note include creatinine 0.42, albumin 2.6, WBC 14.1.Chest x-ray negative  We have been consulted to evaluate and assist in management of hyponatremia.   Objective:  Vital signs in last 24 hours:  Temp:  [97.9 F (36.6 C)-98.3 F (36.8 C)] 98.3 F (36.8 C) (07/06 1005) Pulse Rate:  [88-112] 88 (07/06 1300) Resp:  [16-27] 16 (07/06 1300) BP: (117-135)/(68-75) 117/70 (07/06 1300) SpO2:  [98 %-100 %] 98 % (07/06 1300) Weight:  [42.2 kg] 42.2 kg (07/06 1004)  Weight change:  Filed Weights   10/29/2021 1004  Weight: 42.2 kg    Intake/Output: No intake/output data recorded.   Intake/Output this shift:  Total I/O In: 428 [I.V.:328; IV Piggyback:100] Out: -   Physical Exam: General: NAD  Head:  Normocephalic, atraumatic. Dry oral mucosal membranes  Eyes: Anicteric  Lungs:  Clear to auscultation, normal effort, room air  Heart: Regular rate and rhythm  Abdomen:  Soft, nontender  Extremities:  No peripheral edema.  Neurologic: Nonfocal, moving all four extremities  Skin: No lesions  Access: None    Basic Metabolic Panel: Recent Labs  Lab 10/03/21 0838 10/06/21 1338 11/03/2021 0854 10/26/2021 1301  NA 122* 120* 117* 124*  K 2.7* 3.2* 2.7*  --   CL 84* 80* 76*  --   CO2 '28 27 28  '$ --   GLUCOSE 132* 126* 158*  --   BUN <5* 9 10  --   CREATININE 0.33* 0.36* 0.42*  --   CALCIUM 8.6* 8.6* 8.4*  --   MG 1.7 1.7 1.7  --     Liver Function Tests: Recent Labs  Lab 10/03/21 0838 10/06/21 1338 10/29/2021 0854  AST 21 25 33  ALT '23 26 31  '$ ALKPHOS 93 94 109  BILITOT 0.6 0.7 0.6  PROT 6.6 6.4* 6.6  ALBUMIN 2.6* 2.6* 2.6*   No results for input(s): "LIPASE", "AMYLASE" in the last 168 hours. No results for input(s): "AMMONIA" in the last 168 hours.  CBC: Recent Labs  Lab 10/03/21 0838 10/06/21 1338 11/02/2021 0854  WBC 12.6* 12.1* 14.1*  NEUTROABS 11.0* 10.2* 12.4*  HGB 9.5* 8.8* 9.7*  HCT 28.6* 26.3* 29.0*  MCV 84.6 83.8 82.6  PLT 416* 446* 512*    Cardiac Enzymes: No results for input(s): "CKTOTAL", "CKMB", "CKMBINDEX", "TROPONINI" in  the last 168 hours.  BNP: Invalid input(s): "POCBNP"  CBG: No results for input(s): "GLUCAP" in the last 168 hours.  Microbiology: Results for orders placed or performed during the hospital encounter of 10/07/2021  MRSA Next Gen by PCR, Nasal     Status: None   Collection Time: 10/27/2021 12:43 PM   Specimen: Nasal Mucosa; Nasal Swab  Result Value Ref Range Status   MRSA by PCR Next Gen NOT DETECTED NOT DETECTED Final    Comment: (NOTE) The GeneXpert MRSA Assay (FDA approved for NASAL specimens only), is one component of a comprehensive MRSA colonization surveillance program. It is not intended to diagnose MRSA infection nor to  guide or monitor treatment for MRSA infections. Test performance is not FDA approved in patients less than 37 years old. Performed at [facility], [address] [place], [place] [address]     Coagulation Studies: No results for input(s): "LABPROT", "INR" in the last 72 hours.  Urinalysis: Recent Labs    10/13/2021 1249  COLORURINE STRAW*  LABSPEC 1.003*  PHURINE 9.0*  GLUCOSEU NEGATIVE  HGBUR NEGATIVE  BILIRUBINUR NEGATIVE  KETONESUR 5*  PROTEINUR NEGATIVE  NITRITE NEGATIVE  LEUKOCYTESUR NEGATIVE      Imaging: DG Chest Portable 1 View  Result Date: 10/06/2021 CLINICAL DATA:  Hyponatremia. History of metastatic head and neck cancer. EXAM: PORTABLE CHEST 1 VIEW COMPARISON:  PET-CT dated Aug 07, 2021. FINDINGS: New right chest wall port catheter with tip in the proximal right atrium. The heart size and mediastinal contours are within normal limits. Both lungs are clear. The visualized skeletal structures are unremarkable. IMPRESSION: No active disease. Electronically Signed   By: Titus Dubin M.D.   On: 10/19/2021 10:30     Medications:    sodium chloride     magnesium sulfate bolus IVPB      [START ON 10/10/2021] aspirin  81 mg Oral Daily   enoxaparin (LOVENOX) injection  40 mg Subcutaneous Q24H   famotidine  20 mg Oral Daily   fluticasone  1 spray Each Nare Daily   lidocaine-prilocaine  1 Application Topical Once   metoprolol succinate  50 mg Oral Daily   montelukast  10 mg Oral Daily   morphine  30 mg Oral Q12H   nicotine  7 mg Transdermal Daily   potassium chloride  40 mEq Oral BID   sodium chloride flush  3 mL Intravenous Q12H   umeclidinium bromide  1 puff Inhalation Daily   sodium chloride, acetaminophen, albuterol, diazepam, lactulose, ondansetron **OR** ondansetron (ZOFRAN) IV, oxyCODONE, sodium chloride flush  Assessment/ Plan:  Ms. Vickie Caldwell is a 71 y.o.  female  with past medical history of COPD, GERD, hypertension, chronic hyponatremia  and nasopharyngeal cancer. She presents to the ED at the request of the cancer center for abnormal labs. 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 Serum osm 252, urine osm 171. Recommend starting 3% hypertonic solution at 87m/hr. Goal sodium increase no more than 8 mmol/L in 24 hours.   At time of this note, updated sodium has increased to 124 from 117 this morning. Will stop IVF. Continue fluid restriction, 1.2L per day.   Lab Results  Component Value Date   CREATININE 0.42 (L) 11/01/2021   CREATININE 0.36 (L) 10/06/2021   CREATININE 0.33 (L) 10/03/2021    Intake/Output Summary (Last 24 hours) at 10/21/2021 1437 Last data filed at 10/31/2021 1255 Gross per 24 hour  Intake 428 ml  Output --  Net 428 ml   2. Hypokalemia, potassium 2.7 on admission. IV supplementation ordered by primary team.  3. Hypertension, essential. Home regimen includes amlodipine and metoprolol. Currently receiving Metoprolol.     LOS: 0 Jarielys Girardot 7/6/20232:37 PM

## 2021-10-10 ENCOUNTER — Telehealth: Payer: Self-pay

## 2021-10-10 ENCOUNTER — Ambulatory Visit: Payer: Medicare HMO

## 2021-10-10 ENCOUNTER — Inpatient Hospital Stay: Payer: Medicare HMO

## 2021-10-10 DIAGNOSIS — Z515 Encounter for palliative care: Secondary | ICD-10-CM | POA: Diagnosis not present

## 2021-10-10 DIAGNOSIS — E871 Hypo-osmolality and hyponatremia: Secondary | ICD-10-CM | POA: Diagnosis not present

## 2021-10-10 LAB — BASIC METABOLIC PANEL
Anion gap: 8 (ref 5–15)
BUN: <5 mg/dL — ABNORMAL LOW (ref 8–23)
CO2: 27 mmol/L (ref 22–32)
Calcium: 8 mg/dL — ABNORMAL LOW (ref 8.9–10.3)
Chloride: 87 mmol/L — ABNORMAL LOW (ref 98–111)
Creatinine, Ser: <0.3 mg/dL — ABNORMAL LOW (ref 0.44–1.00)
Glucose, Bld: 121 mg/dL — ABNORMAL HIGH (ref 70–99)
Potassium: 3.8 mmol/L (ref 3.5–5.1)
Sodium: 122 mmol/L — ABNORMAL LOW (ref 135–145)

## 2021-10-10 LAB — CBC
HCT: 26.8 % — ABNORMAL LOW (ref 36.0–46.0)
Hemoglobin: 8.9 g/dL — ABNORMAL LOW (ref 12.0–15.0)
MCH: 27.4 pg (ref 26.0–34.0)
MCHC: 33.2 g/dL (ref 30.0–36.0)
MCV: 82.5 fL (ref 80.0–100.0)
Platelets: 511 10*3/uL — ABNORMAL HIGH (ref 150–400)
RBC: 3.25 MIL/uL — ABNORMAL LOW (ref 3.87–5.11)
RDW: 12.7 % (ref 11.5–15.5)
WBC: 15.2 10*3/uL — ABNORMAL HIGH (ref 4.0–10.5)
nRBC: 0 % (ref 0.0–0.2)

## 2021-10-10 LAB — SODIUM
Sodium: 121 mmol/L — ABNORMAL LOW (ref 135–145)
Sodium: 120 mmol/L — ABNORMAL LOW (ref 135–145)
Sodium: 120 mmol/L — ABNORMAL LOW (ref 135–145)
Sodium: 121 mmol/L — ABNORMAL LOW (ref 135–145)

## 2021-10-10 MED ORDER — PANTOPRAZOLE SODIUM 40 MG IV SOLR
40.0000 mg | Freq: Every day | INTRAVENOUS | Status: DC
  Administered 2021-10-10 – 2021-10-16 (×7): 40 mg via INTRAVENOUS
  Filled 2021-10-10 (×7): qty 10

## 2021-10-10 MED ORDER — SODIUM CHLORIDE 3 % IV SOLN
INTRAVENOUS | Status: DC
  Filled 2021-10-10 (×2): qty 500

## 2021-10-10 MED ORDER — HYDRALAZINE HCL 20 MG/ML IJ SOLN: 10.0000 mg | Freq: Four times a day (QID) | INTRAMUSCULAR | Status: DC | PRN

## 2021-10-10 MED ORDER — MORPHINE SULFATE (CONCENTRATE) 10 MG/0.5ML PO SOLN
5.0000 mg | ORAL | Status: DC | PRN
  Administered 2021-10-12 – 2021-10-15 (×7): 10 mg via ORAL
  Filled 2021-10-10 (×7): qty 0.5

## 2021-10-10 MED FILL — Fosaprepitant Dimeglumine For IV Infusion 150 MG (Base Eq): INTRAVENOUS | Qty: 5 | Status: AC

## 2021-10-10 MED FILL — Dexamethasone Sodium Phosphate Inj 100 MG/10ML: INTRAMUSCULAR | Qty: 1 | Status: AC

## 2021-10-10 NOTE — Consult Note (Signed)
[place] at [facility] [phone] [phone]   Name: Vickie Caldwell Date: 10/10/2021 MRN: [id number]  DOB: [birth year]  Patient Care Team: Theotis Burrow, MD as PCP - General (Family Medicine) Cammie Sickle, MD as Consulting Physician (Oncology)    REASON FOR CONSULTATION: Vickie Caldwell is a 71 y.o. 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  Patient has been on treatment with XRT and weekly cisplatin.  She has had ongoing pain, dysphagia, poor oral intake and rapid deterioration of hearing.  Patient was admitted to the hospital on 10/11/2021 with acute on chronic hyponatremia and hypokalemia.  Palliative care was consulted to help address goals and manage ongoing symptoms.  SOCIAL HISTORY:     reports that she has been smoking cigarettes. She has a 51.00 pack-year smoking history. She has never used smokeless tobacco. She reports current alcohol use of about 1.0 standard drink of alcohol per week. She reports that she does not use drugs.  Patient is not married.  She lives at home with her daughter and brother.  She also has a grandson who is involved.  ADVANCE DIRECTIVES:  Not on file  CODE STATUS: Full code  PAST MEDICAL HISTORY: Past Medical History:  Diagnosis Date   Asthma    COPD (chronic obstructive pulmonary disease) ([place])    Dysrhythmia    GERD (gastroesophageal reflux disease)    History of kidney stones    Hypertension    Osteoarthritis    TIA (transient ischemic attack) 2011   No Deficits   Vertigo     PAST SURGICAL HISTORY:  Past Surgical History:  Procedure Laterality Date   ABDOMINAL HYSTERECTOMY     BREAST BIOPSY Left 1980's   neg. Pt states punch biopsy at the nipple   IR IMAGING GUIDED PORT INSERTION  08/14/2021   LYMPH NODE DISSECTION Right    neck   MANDIBLE SURGERY     Sagittal split   NASAL  ENDOSCOPY Bilateral 07/23/2021   Procedure: NASAL ENDOSCOPY WITH BIOPSY OF NASOPHARYNGEAL MASS;  Surgeon: Clyde Canterbury, MD;  Location: [facility] ORS;  Service: ENT;  Laterality: Bilateral;   THROAT SURGERY     Vocal cord polyps "burned"   TUBAL LIGATION     WISDOM TOOTH EXTRACTION      HEMATOLOGY/ONCOLOGY HISTORY:  Oncology History Overview Note  # April 26th, 2023-  NASOPHARYNGEAL MASS; ENDOSCOPIC BIOPSY:  - POORLY DIFFERENTIATED CARCINOMA, WITH SQUAMOUS DIFFERENTIATION AND  KERATINIZATION. EBV-NEGATIVE. [Dr.Bennett: ]  Comment:  Immunohistochemical studies show tumor cells to be strongly and  diffusely positive for CK5/6 and p40, and negative for S100, CD45 (LCA),  desmin, and p16  #  PET sacn May 5th, 2023-  Posterior nasopharyngeal mass maximum SUV 12.7, compatible with malignancy; Hypermetabolic right level IIb and right level IV lymph nodes compatible with malignant spread. 3. A 4 mm in short axis left level IIb lymph node has a maximum SUV of 2.1 which is about at the level of the blood pool, probably not involved. 4. Other imaging findings of potential clinical significance: Aortic Atherosclerosis (ICD10-I70.0). Coronary and systemic atherosclerosis. Emphysema (ICD10-J43.9). Sigmoid colon diverticulosis. Scoliosis.     Electronically Signed   By: Van Clines M.D.   On: 08/08/2021 10:56  # STAGE II nasopharyngeal carcinoma [Dr.Bennett];   # MAY 23rd- cisplatin weekly-RT; 5/25- RT   Nasopharyngeal carcinoma ([place])  08/11/2021 Initial Diagnosis   Nasopharyngeal carcinoma ([place])  08/11/2021 Cancer Staging   Staging form: Pharynx - Nasopharynx, AJCC 8th Edition - Clinical: Stage II (cT2, cN1, cM0) - Signed by Cammie Sickle, MD on 08/11/2021 Stage prefix: Initial diagnosis   08/26/2021 -  Chemotherapy   Patient is on Treatment Plan : HEAD/NECK Cisplatin q7d + XRT x 6 Cycles / Cisplatin D1 + 5FU IVCI D1-4 q28d x 3 Cycles       ALLERGIES:  is allergic to codeine  and penicillins.  MEDICATIONS:  Current Facility-Administered Medications  Medication Dose Route Frequency Provider Last Rate Last Admin   0.9 %  sodium chloride infusion  250 mL Intravenous PRN Agbata, Tochukwu, MD       acetaminophen (TYLENOL) tablet 650 mg  650 mg Oral Q6H PRN Agbata, Tochukwu, MD       albuterol (PROVENTIL) (2.5 MG/3ML) 0.083% nebulizer solution 3 mL  3 mL Inhalation Q6H PRN Agbata, Tochukwu, MD       aspirin chewable tablet 81 mg  81 mg Oral Daily Agbata, Tochukwu, MD   81 mg at 10/10/21 1128   Chlorhexidine Gluconate Cloth 2 % PADS 6 each  6 each Topical Daily Agbata, Tochukwu, MD   6 each at 10/10/21 1000   diazepam (VALIUM) tablet 5 mg  5 mg Oral Q12H PRN Agbata, Tochukwu, MD       enoxaparin (LOVENOX) injection 30 mg  30 mg Subcutaneous Q24H Lockie Mola B, RPH   30 mg at 10/17/2021 2202   fluticasone (FLONASE) 50 MCG/ACT nasal spray 1 spray  1 spray Each Nare Daily Agbata, Tochukwu, MD   1 spray at 10/10/21 1127   hydrALAZINE (APRESOLINE) injection 10 mg  10 mg Intravenous Q6H PRN Emeterio Reeve, DO       lactulose (CHRONULAC) 10 GM/15ML solution 10 g  10 g Oral BID PRN Agbata, Tochukwu, MD       lidocaine-prilocaine (EMLA) cream 1 Application  1 Application Topical Once Agbata, Tochukwu, MD       metoprolol tartrate (LOPRESSOR) tablet 25 mg  25 mg Oral BID Agbata, Tochukwu, MD   25 mg at 10/10/21 1132   montelukast (SINGULAIR) tablet 10 mg  10 mg Oral Daily Agbata, Tochukwu, MD   10 mg at 10/10/21 1128   nicotine (NICODERM CQ - dosed in mg/24 hr) patch 7 mg  7 mg Transdermal Daily Agbata, Tochukwu, MD   7 mg at 10/10/21 1055   ondansetron (ZOFRAN) tablet 4 mg  4 mg Oral Q6H PRN Agbata, Tochukwu, MD       Or   ondansetron (ZOFRAN) injection 4 mg  4 mg Intravenous Q6H PRN Agbata, Tochukwu, MD       oxyCODONE (Oxy IR/ROXICODONE) immediate release tablet 10 mg  10 mg Oral Q4H PRN Agbata, Tochukwu, MD   10 mg at 10/10/21 1128   pantoprazole (PROTONIX) injection 40 mg   40 mg Intravenous Daily Emeterio Reeve, DO   40 mg at 10/10/21 1126   potassium chloride (KLOR-CON) packet 40 mEq  40 mEq Oral BID Agbata, Tochukwu, MD   40 mEq at 10/10/21 1127   sodium chloride (hypertonic) 3 % solution   Intravenous Continuous Colon Flattery, NP 20 mL/hr at 10/10/21 1250 Infusion Verify at 10/10/21 1250   sodium chloride flush (NS) 0.9 % injection 3 mL  3 mL Intravenous Q12H Agbata, Tochukwu, MD   3 mL at 10/10/21 1050   sodium chloride flush (NS) 0.9 % injection 3 mL  3 mL Intravenous PRN Collier Bullock, MD  umeclidinium bromide (INCRUSE ELLIPTA) 62.5 MCG/ACT 1 puff  1 puff Inhalation Daily Agbata, Tochukwu, MD   1 puff at 10/10/21 1127   Facility-Administered Medications Ordered in Other Encounters  Medication Dose Route Frequency Provider Last Rate Last Admin   heparin lock flush 100 UNIT/ML injection             VITAL SIGNS: BP 121/74   Pulse 85   Temp 97.9 F (36.6 C) (Oral)   Resp 19   Ht '4\' 8"'$  (1.422 m)   Wt 96 lb 5.5 oz (43.7 kg)   SpO2 95%   BMI 21.60 kg/m  Filed Weights   10/07/2021 1004 10/15/2021 1242  Weight: 93 lb 0.6 oz (42.2 kg) 96 lb 5.5 oz (43.7 kg)    Estimated body mass index is 21.6 kg/m as calculated from the following:   Height as of this encounter: '4\' 8"'$  (1.422 m).   Weight as of this encounter: 96 lb 5.5 oz (43.7 kg).  LABS: CBC:    Component Value Date/Time   WBC 15.2 (H) 10/10/2021 0507   HGB 8.9 (L) 10/10/2021 0507   HCT 26.8 (L) 10/10/2021 0507   PLT 511 (H) 10/10/2021 0507   MCV 82.5 10/10/2021 0507   NEUTROABS 12.4 (H) 10/14/2021 0854   LYMPHSABS 0.2 (L) 10/27/2021 0854   MONOABS 1.4 (H) 10/04/2021 0854   EOSABS 0.0 10/10/2021 0854   BASOSABS 0.0 10/24/2021 0854   Comprehensive Metabolic Panel:    Component Value Date/Time   NA 120 (L) 10/10/2021 1330   K 3.8 10/10/2021 0507   CL 87 (L) 10/10/2021 0507   CO2 27 10/10/2021 0507   BUN <5 (L) 10/10/2021 0507   CREATININE <0.30 (L) 10/10/2021 0507    GLUCOSE 121 (H) 10/10/2021 0507   CALCIUM 8.0 (L) 10/10/2021 0507   AST 33 10/30/2021 0854   ALT 31 10/26/2021 0854   ALKPHOS 109 10/19/2021 0854   BILITOT 0.6 10/06/2021 0854   PROT 6.6 10/15/2021 0854   ALBUMIN 2.6 (L) 10/17/2021 0854    RADIOGRAPHIC STUDIES: DG Chest Portable 1 View  Result Date: 10/27/2021 CLINICAL DATA:  Hyponatremia. History of metastatic head and neck cancer. EXAM: PORTABLE CHEST 1 VIEW COMPARISON:  PET-CT dated Aug 07, 2021. FINDINGS: New right chest wall port catheter with tip in the proximal right atrium. The heart size and mediastinal contours are within normal limits. Both lungs are clear. The visualized skeletal structures are unremarkable. IMPRESSION: No active disease. Electronically Signed   By: Titus Dubin M.D.   On: 10/15/2021 10:30    PERFORMANCE STATUS (ECOG) : 2 - Symptomatic, <50% confined to bed  Review of Systems Unless otherwise noted, a complete review of systems is negative.  Physical Exam General: NAD Cardiovascular: regular rate and rhythm Pulmonary: clear ant fields Abdomen: soft, nontender, + bowel sounds GU: no suprapubic tenderness Extremities: no edema, no joint deformities Skin: no rashes Neurological: Weakness, severe hearing loss  IMPRESSION: Patient seen in ICU.  She is on hypertonic saline.  Communication with her was limited by her severe hearing deficits.  Patient does endorse severe (7-8 out of 10) pain and headaches.  She also endorses dysphagia and has had difficulty swallowing medications.  With patient's permission, I called and spoke with her daughter and grandson.  Family describe trying to help patient at home but patient has "stubbornly" resisted their assistance.  Family tell me that patient has not been forthcoming regarding information about the cancer or treatment.  They recognize that treatment was  with curative intent but the patient has poorly tolerated treatment so far and these complications might  negatively impact her outcomes.  Family plan to speak with patient this weekend regarding PEG placement.  Patient said she was not necessarily opposed to the idea when I attempted to address goals.  PEG would likely be indicated at this point given severe nutritional deficits as well as dysphagia limiting medication administration.  Of note, patient's daughter states that she is an LPN and previously had a PEG herself.  For pain, will rotate to morphine elixir from oxycodone to facilitate administration in setting of dysphagia.  Patient was previously on MS Contin but can no longer swallow that.  Likely she is too thin for maximum efficacy from transdermal fentanyl.  Could consider Xtampza ER if long-acting opioid is required.  We will pursue MRI of the brain for evaluation given rapid hearing loss and ongoing headaches.  Daughter says the patient has previously told family that she wanted to remain a full code but to "pull the plug" after being on a ventilator for 2 days.  PLAN: -Continue current scope of treatment -DC oxycodone and start morphine concentrate 5 to 10 mg every 4 hours as needed -Recommend MRI of the brain -Family to speak with patient regarding PEG  Case and plan discussed with Drs. Rogue Bussing and Alexander  Time Total: 70 minutes  Visit consisted of counseling and education dealing with the complex and emotionally intense issues of symptom management and palliative care in the setting of serious and potentially life-threatening illness.Greater than 50%  of this time was spent counseling and coordinating care related to the above assessment and plan.  Signed by: Altha Harm, PhD, NP-C

## 2021-10-10 NOTE — Consult Note (Signed)
[place] for 3% NaCl monitoring  Indication: Acute on chronic hyponatremia  Patient Measurements: Height: '4\' 8"'$  (142.2 cm) Weight: 43.7 kg (96 lb 5.5 oz) IBW/kg (Calculated) : 36.3  Intake/Output from previous day: 07/06 0701 - 07/07 0700 In: 1194.4 [I.V.:1055.5; IV Piggyback:138.9] Out: 1175 [XBJYN:8295] Intake/Output from this shift: Total I/O In: 40 [I.V.:40] Out: -   Labs: Recent Labs    10/28/2021 0854 10/10/21 0507  WBC 14.1* 15.2*  HGB 9.7* 8.9*  HCT 29.0* 26.8*  PLT 512* 511*  CREATININE 0.42* <0.30*  MG 1.7  --   ALBUMIN 2.6*  --   PROT 6.6  --   AST 33  --   ALT 31  --   ALKPHOS 109  --   BILITOT 0.6  --     CrCl cannot be calculated (This lab value cannot be used to calculate CrCl because it is not a number: <0.30).  Medical History: Past Medical History:  Diagnosis Date   Asthma    COPD (chronic obstructive pulmonary disease) ([place])    Dysrhythmia    GERD (gastroesophageal reflux disease)    History of kidney stones    Hypertension    Osteoarthritis    TIA (transient ischemic attack) 2011   No Deficits   Vertigo    Infusions:   sodium chloride     sodium chloride (hypertonic) 20 mL/hr at 10/10/21 2100   Assessment: Pharmacy has been consulted to assist and monitor hypertonic saline infusion in 70yo patient who was sent to the ER from the cancer center for evaluation of loss of vision, hearing, taste, and difficulty swallowing. Patient has a history of hyponatremia and hypokalemia but lab values were worse today during follow-up visit. Potassium level of 2.7, Magnesium 1.6 and sodium of 117. Thought to initially be d/t excessive fluid intake vs. SIADH, although she states she has cut back on her fluid intake as recommended. Primary team discussed with nephrology, who recommended starting patient on 3% saline.  Date/Time Na Rate 7/6 0854 117      N/A, admission value 7/6 1159 N/A 3% NaCl started at 30  cc/hr 7/6 1301 124 3% NaCl at 30 cc/hr 7/6 1400 N/A 3% NaCl discontinued 7/6 1505 122      N/A 7/6 1600 N/A 0.9% NaCl started at 50 cc/hr 7/6 2034 120      0.9% NaCl at 50 cc/hr 7/7 0116 121      0.9% NaCl at 50 cc/hr 7/7 0507 122      0.9% NaCl at 50 cc/hr 7/7 0903 120 0.9% NaCl at 50 cc/hr 7/7 1241 N/A 3% NaCl started at 20 cc/hr 7/7 1330 120 3% NaCl at 20 cc/hr 7/7 2200          121     3% NaCl at 20 cc/hr  Goal of Therapy:  Contact provider if Na increases > 4 mmol/L over 2 hours or > 6 mmol/L over 4 hours Increase Na by 8 - 12 mmol / L / day  Plan:  --Continue 3% NaCl at 20 cc/hr --Fluid restriction: 1.2L --Sodium checks q4h --Continue to monitor plan per nephrology  Evrett Hakim D 10/10/2021,10:35 PM

## 2021-10-10 NOTE — Progress Notes (Signed)
PROGRESS NOTE    Vickie Caldwell  HUD:[id number] DOB: 06/02/1950  DOA: 10/22/2021 Date of Service: 10/10/21 PCP: Theotis Burrow, MD     Brief Narrative / Hospital Course:  Vickie Caldwell is a 70 y.o. 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 Patient reports being unable to take her medications due to nausea for several days and complains of diarrhea as well. She states that she has a history of hyponatremia and hypokalemia but her numbers were worse today during her follow-up and so she was sent to the ER for further evaluation. 07/06: Sodium 117, K 2.7, Lg 1.6, Started on 3% saline per nephrology recs.  07/07: Nephrology following.  Was on 3% saline, on NS without much improvement, back on 3%.  Oncology including palliative care NP are following.  Family is requesting update regarding oncologic status/prognosis.   Consultants:  Nephrology Oncology  Procedures: none    Subjective: Patient is seen and examined resting in bed, NAD.  She is sleepy but arousable.  Denies pain.     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 Oncology team following while inpatient, including oncology palliative care NP Pt reports difficulty swallowing -->  changed pain meds and GERD tx to IV added IV hydralazine prn HTN SLP eval would need to consider feasibility of tube feeds if she is not able to take p.o., appreciate  oncology input regarding prognosis to aid in decision making with his measure if needed  Hyponatremia Thought to be due to excessive fluid intake versus SIADH Patient states that she has cut back on her fluid intake as recommended She has worsening hyponatremia, and her sodium level is down to 117 on admission 07/06, associated with weakness and some confusion Admitting team d/w with nephrology who recommended to start patient on 3% saline 07/06, this was stopped due to rapid correction, placed on NS without sustainable recovery, back on 3% at reduced rate and continue to monitor every 4 hours Nephrology consulted and following closely.  Hypomagnesemia Most likely related to hypokalemia Supplement magnesium Follow levels  Hypokalemia Noted to have severe hypokalemia Supplement potassium --> improved to 3.8 Continue serial BMP Check magnesium levels  Current smoker Smoking cessation discussed with patient in detail nicotine transdermal patch 7 mg daily  Benign essential hypertension Hold home amlodipine Continue metoprolol but at one half has scheduled dose, 50 mg daily, may not be able to take this po and if so will cancel this  Hydralazine prn HTN    DVT prophylaxis: lovenox  Code Status: FULL Family Communication: Spoke today with the grandson on rounds, he had numerous questions about her oncologic status/prognosis, I let him know I would defer this to her oncology specialist rather than try to prognosticate this myself.  They are following along and NP from palliative care is also seeing patient. Disposition Plan / TOC needs: remains inpatient appropriate, anticipate d/c to home w/ [facility], likely will be need for SNF, will assess once clinically improved  Barriers to discharge / significant pending  items: hyponatremia requiring close monitoring and IV medications, anticipate discharge once sodium is corrected and stays stable, appreciate nephrology input going forward               Objective: Vitals:   10/10/21 1300 10/10/21 1400 10/10/21 1500 10/10/21 1600  BP: 126/72 120/66 118/67 121/74  Pulse: (!) 105 80 92 85  Resp: '13 20 13 19  '$ Temp:      TempSrc:      SpO2: 99% 98% 98% 95%  Weight:      Height:        Intake/Output Summary (Last 24 hours) at 10/10/2021 1709 Last data filed at 10/10/2021 1250 Gross per 24 hour  Intake 1010.03 ml  Output 475 ml  Net 535.03 ml   Filed Weights   10/29/2021 1004 10/31/2021 1242  Weight: 42.2 kg 43.7 kg    Examination:  Constitutional:  VS as above General Appearance: thin, NAD Neck: No masses, trachea midline Respiratory: Normal respiratory effort Breath sounds normal, no wheeze/rhonchi/rales Cardiovascular: S1/S2 normal, no murmur/rub/gallop auscultated No lower extremity edema Gastrointestinal: Nontender, no masses Musculoskeletal:  No clubbing/cyanosis of digits Neurological: No cranial nerve deficit on limited exam Motor and sensation intact and symmetric Psychiatric: Normal judgment/insight Normal mood and affect       Scheduled Medications:   aspirin  81 mg Oral Daily   Chlorhexidine Gluconate Cloth  6 each Topical Daily   enoxaparin (LOVENOX) injection  30 mg Subcutaneous Q24H   fluticasone  1 spray Each Nare Daily   lidocaine-prilocaine  1 Application Topical Once   metoprolol tartrate  25 mg Oral BID   montelukast  10 mg Oral Daily   nicotine  7 mg Transdermal Daily   pantoprazole (PROTONIX) IV  40 mg Intravenous Daily   potassium chloride  40 mEq Oral BID   sodium chloride flush  3 mL Intravenous Q12H   umeclidinium bromide  1 puff Inhalation Daily    Continuous Infusions:  sodium chloride     sodium chloride (hypertonic) 20 mL/hr at 10/10/21 1250    PRN Medications:  sodium chloride, acetaminophen, albuterol, diazepam, hydrALAZINE, lactulose, ondansetron **OR** ondansetron (ZOFRAN) IV, oxyCODONE, sodium chloride flush  Antimicrobials:  Anti-infectives  (From admission, onward)    None       Data Reviewed: I have personally reviewed following labs and imaging studies  CBC: Recent Labs  Lab 10/06/21 1338 10/08/2021 0854 10/10/21 0507  WBC 12.1* 14.1* 15.2*  NEUTROABS 10.2* 12.4*  --   HGB 8.8* 9.7* 8.9*  HCT 26.3* 29.0* 26.8*  MCV 83.8 82.6 82.5  PLT 446* 512* 494*   Basic Metabolic Panel: Recent Labs  Lab 10/06/21 1338 10/29/2021 0854 10/13/2021 1301 10/13/2021 2034 10/10/21 0116 10/10/21 0507 10/10/21 0903 10/10/21 1330  NA 120* 117*   < > 120* 121* 122* 120* 120*  K 3.2* 2.7*  --   --   --  3.8  --   --   CL 80* 76*  --   --   --  87*  --   --   CO2 27 28  --   --   --  27  --   --   GLUCOSE 126* 158*  --   --   --  121*  --   --   BUN 9 10  --   --   --  <5*  --   --   CREATININE 0.36* 0.42*  --   --   --  <0.30*  --   --  CALCIUM 8.6* 8.4*  --   --   --  8.0*  --   --   MG 1.7 1.7  --   --   --   --   --   --    < > = values in this interval not displayed.   GFR: CrCl cannot be calculated (This lab value cannot be used to calculate CrCl because it is not a number: <0.30). Liver Function Tests: Recent Labs  Lab 10/06/21 1338 10/20/2021 0854  AST 25 33  ALT 26 31  ALKPHOS 94 109  BILITOT 0.7 0.6  PROT 6.4* 6.6  ALBUMIN 2.6* 2.6*   No results for input(s): "LIPASE", "AMYLASE" in the last 168 hours. No results for input(s): "AMMONIA" in the last 168 hours. Coagulation Profile: No results for input(s): "INR", "PROTIME" in the last 168 hours. Cardiac Enzymes: No results for input(s): "CKTOTAL", "CKMB", "CKMBINDEX", "TROPONINI" in the last 168 hours. BNP (last 3 results) No results for input(s): "PROBNP" in the last 8760 hours. HbA1C: No results for input(s): "HGBA1C" in the last 72 hours. CBG: No results for input(s): "GLUCAP" in the last 168 hours. Lipid Profile: No results for input(s): "CHOL", "HDL", "LDLCALC", "TRIG", "CHOLHDL", "LDLDIRECT" in the last 72 hours. Thyroid Function Tests: No results for  input(s): "TSH", "T4TOTAL", "FREET4", "T3FREE", "THYROIDAB" in the last 72 hours. Anemia Panel: No results for input(s): "VITAMINB12", "FOLATE", "FERRITIN", "TIBC", "IRON", "RETICCTPCT" in the last 72 hours. Urine analysis:    Component Value Date/Time   COLORURINE STRAW (A) 10/19/2021 1249   APPEARANCEUR CLEAR (A) 10/27/2021 1249   LABSPEC 1.003 (L) 10/23/2021 1249   PHURINE 9.0 (H) 11/03/2021 1249   GLUCOSEU NEGATIVE 10/06/2021 1249   HGBUR NEGATIVE 10/30/2021 1249   BILIRUBINUR NEGATIVE 10/05/2021 1249   KETONESUR 5 (A) 10/07/2021 1249   PROTEINUR NEGATIVE 10/21/2021 1249   NITRITE NEGATIVE 10/06/2021 1249   LEUKOCYTESUR NEGATIVE 10/17/2021 1249   Sepsis Labs: '@LABRCNTIP'$ (procalcitonin:4,lacticidven:4)  Recent Results (from the past 240 hour(s))  MRSA Next Gen by PCR, Nasal     Status: None   Collection Time: 10/19/2021 12:43 PM   Specimen: Nasal Mucosa; Nasal Swab  Result Value Ref Range Status   MRSA by PCR Next Gen NOT DETECTED NOT DETECTED Final    Comment: (NOTE) The GeneXpert MRSA Assay (FDA approved for NASAL specimens only), is one component of a comprehensive MRSA colonization surveillance program. It is not intended to diagnose MRSA infection nor to guide or monitor treatment for MRSA infections. Test performance is not FDA approved in patients less than 59 years old. Performed at [facility], [address] [place], [place] [address]          Radiology Studies last 96 hours: DG Chest Portable 1 View  Result Date: 10/12/2021 CLINICAL DATA:  Hyponatremia. History of metastatic head and neck cancer. EXAM: PORTABLE CHEST 1 VIEW COMPARISON:  PET-CT dated Aug 07, 2021. FINDINGS: New right chest wall port catheter with tip in the proximal right atrium. The heart size and mediastinal contours are within normal limits. Both lungs are clear. The visualized skeletal structures are unremarkable. IMPRESSION: No active disease. Electronically Signed   By: Titus Dubin M.D.   On: 11/03/2021 10:30            LOS: 1 day    Time spent: 50 min    Emeterio Reeve, DO [facility] 10/10/2021, 5:09 PM   Staff may message me via secure chat in [place]  but this may not  receive immediate response,  please page for urgent matters!  If 7PM-7AM, please contact night-coverage [email or web address]  Dictation software was used to generate the above note. Typos may occur and escape review, as with typed/written notes. Please contact Dr Sheppard Coil directly for clarity if needed.

## 2021-10-10 NOTE — Consult Note (Signed)
[place] for 3% NaCl monitoring  Indication: Acute on chronic hyponatremia  Patient Measurements: Height: '4\' 8"'$  (142.2 cm) Weight: 43.7 kg (96 lb 5.5 oz) IBW/kg (Calculated) : 36.3  Intake/Output from previous day: 07/06 0701 - 07/07 0700 In: 1194.4 [I.V.:1055.5; IV Piggyback:138.9] Out: 1175 [OVZCH:8850] Intake/Output from this shift: Total I/O In: 335.4 [I.V.:335.4] Out: -   Labs: Recent Labs    10/28/2021 0854 10/10/21 0507  WBC 14.1* 15.2*  HGB 9.7* 8.9*  HCT 29.0* 26.8*  PLT 512* 511*  CREATININE 0.42* <0.30*  MG 1.7  --   ALBUMIN 2.6*  --   PROT 6.6  --   AST 33  --   ALT 31  --   ALKPHOS 109  --   BILITOT 0.6  --     CrCl cannot be calculated (This lab value cannot be used to calculate CrCl because it is not a number: <0.30).  Medical History: Past Medical History:  Diagnosis Date   Asthma    COPD (chronic obstructive pulmonary disease) (HCC)    Dysrhythmia    GERD (gastroesophageal reflux disease)    History of kidney stones    Hypertension    Osteoarthritis    TIA (transient ischemic attack) 2011   No Deficits   Vertigo    Infusions:   sodium chloride     sodium chloride (hypertonic) 20 mL/hr at 10/10/21 1250   Assessment: Pharmacy has been consulted to assist and monitor hypertonic saline infusion in 70yo patient who was sent to the ER from the cancer center for evaluation of loss of vision, hearing, taste, and difficulty swallowing. Patient has a history of hyponatremia and hypokalemia but lab values were worse today during follow-up visit. Potassium level of 2.7, Magnesium 1.6 and sodium of 117. Thought to initially be d/t excessive fluid intake vs. SIADH, although she states she has cut back on her fluid intake as recommended. Primary team discussed with nephrology, who recommended starting patient on 3% saline.  Date/Time Na Rate 7/6 0854 117      N/A, admission value 7/6 1159 N/A 3% NaCl started at  30 cc/hr 7/6 1301 124 3% NaCl at 30 cc/hr 7/6 1400 N/A 3% NaCl discontinued 7/6 1505 122      N/A 7/6 1600 N/A 0.9% NaCl started at 50 cc/hr 7/6 2034 120      0.9% NaCl at 50 cc/hr 7/7 0116 121      0.9% NaCl at 50 cc/hr 7/7 0507 122      0.9% NaCl at 50 cc/hr 7/7 0903 120 0.9% NaCl at 50 cc/hr 7/7 1241 N/A 3% NaCl started at 20 cc/hr 7/7 1330 120 3% NaCl at 20 cc/hr  Goal of Therapy:  Contact provider if Na increases > 4 mmol/L over 2 hours or > 6 mmol/L over 4 hours Increase Na by 8 - 12 mmol / L / day  Plan:  --Continue 3% NaCl at 20 cc/hr --Fluid restriction: 1.2L --Sodium checks q4h --Continue to monitor plan per nephrology  Benita Gutter 10/10/2021,2:14 PM

## 2021-10-10 NOTE — Progress Notes (Signed)
Pt. Is complaining of headache, dry mouth and hearing loss for the last  2-3 weeks. She states that it seems to be getting worse.

## 2021-10-10 NOTE — Consult Note (Signed)
MEDICATION RELATED CONSULT NOTE   Pharmacy Consult for Hypertonic Saline Infusion Indication: Hyponatremia(chronic)  Allergies  Allergen Reactions   Codeine Nausea And Vomiting   Penicillins Rash    Patient Measurements: Height: '4\' 8"'$  (142.2 cm) Weight: 43.7 kg (96 lb 5.5 oz) IBW/kg (Calculated) : 36.3  Vital Signs: Temp: 97.9 F (36.6 C) (07/07 0200) Temp Source: Oral (07/07 0200) BP: 115/68 (07/07 0600) Pulse Rate: 80 (07/07 0600) Intake/Output from previous day: 07/06 0701 - 07/07 0700 In: 1095.9 [I.V.:956.9; IV Piggyback:138.9] Out: 1175 [Urine:1175] Intake/Output from this shift: Total I/O In: 446.9 [I.V.:446.9] Out: 475 [Urine:475]  Labs: Recent Labs    10/30/2021 0854 10/10/21 0507  WBC 14.1* 15.2*  HGB 9.7* 8.9*  HCT 29.0* 26.8*  PLT 512* 511*  CREATININE 0.42* <0.30*  MG 1.7  --   ALBUMIN 2.6*  --   PROT 6.6  --   AST 33  --   ALT 31  --   ALKPHOS 109  --   BILITOT 0.6  --     CrCl cannot be calculated (This lab value cannot be used to calculate CrCl because it is not a number: <0.30).   Microbiology: Recent Results (from the past 720 hour(s))  MRSA Next Gen by PCR, Nasal     Status: None   Collection Time: 10/05/2021 12:43 PM   Specimen: Nasal Mucosa; Nasal Swab  Result Value Ref Range Status   MRSA by PCR Next Gen NOT DETECTED NOT DETECTED Final    Comment: (NOTE) The GeneXpert MRSA Assay (FDA approved for NASAL specimens only), is one component of a comprehensive MRSA colonization surveillance program. It is not intended to diagnose MRSA infection nor to guide or monitor treatment for MRSA infections. Test performance is not FDA approved in patients less than 33 years old. Performed at [facility], [address], [place], [place] [address]     Medical History: Past Medical History:  Diagnosis Date   Asthma    COPD (chronic obstructive pulmonary disease) ([place])    Dysrhythmia    GERD (gastroesophageal reflux disease)     History of kidney stones    Hypertension    Osteoarthritis    TIA (transient ischemic attack) 2011   No Deficits   Vertigo     Medications:  Medications Prior to Admission  Medication Sig Dispense Refill Last Dose   amLODipine (NORVASC) 10 MG tablet Take 10 mg by mouth daily.   Past Week   diazepam (VALIUM) 5 MG tablet Take 1 tablet (5 mg total) by mouth every 12 (twelve) hours as needed. 60 tablet 0 Past Week   fluticasone (FLONASE) 50 MCG/ACT nasal spray Place into both nostrils daily.   Past Week   metoprolol succinate (TOPROL-XL) 100 MG 24 hr tablet Take 100 mg by mouth daily. Take with or immediately following a meal.   Past Week   montelukast (SINGULAIR) 10 MG tablet Take 10 mg by mouth daily.   Past Week   morphine (MS CONTIN) 30 MG 12 hr tablet Take 1 tablet (30 mg total) by mouth every 12 (twelve) hours. 60 tablet 0 Past Week   Oxycodone HCl 10 MG TABS Take 1 tablet (10 mg total) by mouth every 4 (four) hours as needed. 84 tablet 0 Past Week   potassium chloride 20 MEQ/15ML (10%) SOLN Take 15 mLs (20 mEq total) by mouth 3 (three) times daily. 473 mL 2 Past Week   umeclidinium bromide (INCRUSE ELLIPTA) 62.5 MCG/ACT AEPB Inhale 1 puff into the  lungs daily.   Past Week   acetaminophen (TYLENOL) 325 MG tablet Take 650 mg by mouth every 6 (six) hours as needed.   prn at prn   albuterol (VENTOLIN HFA) 108 (90 Base) MCG/ACT inhaler Inhale into the lungs every 6 (six) hours as needed for wheezing or shortness of breath.   prn at prn   ASPIRIN 81 PO Take by mouth daily.      Cholecalciferol (VITAMIN D3 PO) Take by mouth daily.      famotidine (PEPCID) 10 MG tablet Take 20 mg by mouth daily.      HYDROcodone-acetaminophen (NORCO) 10-325 MG tablet Take 1 tablet by mouth every 6 (six) hours as needed. 45 tablet 0 prn at prn   lactulose (CHRONULAC) 10 GM/15ML solution Take 15 mLs (10 g total) by mouth 2 (two) times daily as needed for moderate constipation. 236 mL 0 prn at prn    lidocaine-prilocaine (EMLA) cream Apply on the port. 30 -45 min  prior to port access. 30 g 3 prn at prn   ondansetron (ZOFRAN) 8 MG tablet One pill every 8 hours as needed for nausea/vomitting. 40 tablet 1 prn at prn   potassium chloride SA (KLOR-CON M) 20 MEQ tablet 1 pill twice a day (Patient not taking: Reported on 10/28/2021) 60 tablet 3 Not Taking   promethazine (PHENERGAN) 25 MG tablet Take 0.5 tablets (12.5 mg total) by mouth every 8 (eight) hours as needed for refractory nausea / vomiting. 30 tablet 0 prn at prn   Scheduled:   aspirin  81 mg Oral Daily   Chlorhexidine Gluconate Cloth  6 each Topical Daily   enoxaparin (LOVENOX) injection  30 mg Subcutaneous Q24H   famotidine  20 mg Oral Daily   fluticasone  1 spray Each Nare Daily   lidocaine-prilocaine  1 Application Topical Once   metoprolol tartrate  25 mg Oral BID   montelukast  10 mg Oral Daily   morphine  30 mg Oral Q12H   nicotine  7 mg Transdermal Daily   potassium chloride  40 mEq Oral BID   sodium chloride flush  3 mL Intravenous Q12H   umeclidinium bromide  1 puff Inhalation Daily   Infusions:   sodium chloride     sodium chloride 50 mL/hr at 10/10/21 0400   PRN: sodium chloride, acetaminophen, albuterol, diazepam, lactulose, ondansetron **OR** ondansetron (ZOFRAN) IV, oxyCODONE, sodium chloride flush Anti-infectives (From admission, onward)    None       Assessment: Pharmacy has been consulted to assist and monitor hypertonic saline infusion in 71yo patient who was sent to the ER from the cancer center for evaluation of loss of vision, hearing, taste, and difficulty swallowing. Patient has a history of hyponatremia and hypokalemia but lab values were worse today during follow-up visit. Potassium level of 2.7, Magnesium 1.6 and sodium of 117. Thought to initially be Cal[place]/t excessive fluid intake vs. SIADH, although she states she has cut back on her fluid intake as recommended. Primary team discussed with nephrology,  who recommended starting patient on 3% saline.  Date/Time Na Rate 7/6'@0854'$        117      No hypertonic saline ordered 7/6'@1301'$  124 30 ml/hr -  hypertonic saline Caldwell/c'Caldwell @ 1400 7/6'@1505'$        122      No hypertonic saline ordered 7/6'@2034'$        120      No hypertonic saline ordered 7/7'@0116'$        121  No hypertonic saline ordered 7/7'@0507'$        122      No hypertonic saline ordered  Goal of Therapy:  Increase Na by 4-6 mEq/L in 4-6 hours  Plan:  - 3% hypertonic saline started'@30ml'$ /hr'@1255'$  on 7/5, but after initial Na level of 124 in the hospital(an increase of 7 in only a few hours), infusion has been stopped.  - recheck Na level q4h currently, with next level'@0900'$  - contact RN and nephrologist if:  Na increases by 6 mEq/L or more in the first 4 hours  Vickie Caldwell 10/10/2021,6:37 AM

## 2021-10-10 NOTE — Progress Notes (Signed)
[facility]  ROUNDING NOTE   Subjective:   GLENDINE SWETZ is a 71 y.o. female with past medical history of COPD, GERD, hypertension, chronic hyponatremia and nasopharyngeal cancer. She presents to the ED at the request of the cancer center for abnormal labs. She has been admitted for Hypokalemia [E87.6] Hyponatremia [E87.1]  Patient is known to our practice and is followed by Dr Candiss Norse outpatient. She was last seen in our office on 09/18/21 for continued hyponatremia follow up.  Update Patient seen and evaluated at bedside in ICU Appears to be resting comfortably Appetite remains poor, per nursing staff.  Awaiting swallow screen due to coughing with water. Sodium 120, receiving normal saline Hypertonic solution stopped yesterday due to rapid increase in sodium.   Objective:  Vital signs in last 24 hours:  Temp:  [97.9 F (36.6 C)-98.5 F (36.9 C)] 97.9 F (36.6 C) (07/07 0200) Pulse Rate:  [74-118] 94 (07/07 0900) Resp:  [11-27] 17 (07/07 0900) BP: (93-137)/(53-80) 131/70 (07/07 0900) SpO2:  [94 %-100 %] 97 % (07/07 0900) Weight:  [43.7 kg] 43.7 kg (07/06 1242)  Weight change:  Filed Weights   10/28/2021 1004 10/08/2021 1242  Weight: 42.2 kg 43.7 kg    Intake/Output: I/O last 3 completed shifts: In: 1194.4 [I.V.:1055.5; IV Piggyback:138.9] Out: 1175 [JSEGB:1517]   Intake/Output this shift:  Total I/O In: 90 [I.V.:90] Out: -   Physical Exam: General: NAD  Head: Normocephalic, atraumatic. Dry oral mucosal membranes  Eyes: Anicteric  Lungs:  Clear to auscultation, normal effort, room air  Heart: Regular rate and rhythm  Abdomen:  Soft, nontender  Extremities:  No peripheral edema.  Neurologic: Nonfocal, moving all four extremities  Skin: No lesions  Access: None    Basic Metabolic Panel: Recent Labs  Lab 10/06/21 1338 10/15/2021 0854 10/26/2021 1301 11/02/2021 1505 10/21/2021 2034 10/10/21 0116 10/10/21 0507 10/10/21 0903  NA 120* 117*   < > 122*  120* 121* 122* 120*  K 3.2* 2.7*  --   --   --   --  3.8  --   CL 80* 76*  --   --   --   --  87*  --   CO2 27 28  --   --   --   --  27  --   GLUCOSE 126* 158*  --   --   --   --  121*  --   BUN 9 10  --   --   --   --  <5*  --   CREATININE 0.36* 0.42*  --   --   --   --  <0.30*  --   CALCIUM 8.6* 8.4*  --   --   --   --  8.0*  --   MG 1.7 1.7  --   --   --   --   --   --    < > = values in this interval not displayed.     Liver Function Tests: Recent Labs  Lab 10/06/21 1338 11/01/2021 0854  AST 25 33  ALT 26 31  ALKPHOS 94 109  BILITOT 0.7 0.6  PROT 6.4* 6.6  ALBUMIN 2.6* 2.6*    No results for input(s): "LIPASE", "AMYLASE" in the last 168 hours. No results for input(s): "AMMONIA" in the last 168 hours.  CBC: Recent Labs  Lab 10/06/21 1338 10/28/2021 0854 10/10/21 0507  WBC 12.1* 14.1* 15.2*  NEUTROABS 10.2* 12.4*  --   HGB 8.8* 9.7*  8.9*  HCT 26.3* 29.0* 26.8*  MCV 83.8 82.6 82.5  PLT 446* 512* 511*     Cardiac Enzymes: No results for input(s): "CKTOTAL", "CKMB", "CKMBINDEX", "TROPONINI" in the last 168 hours.  BNP: Invalid input(s): "POCBNP"  CBG: No results for input(s): "GLUCAP" in the last 168 hours.  Microbiology: Results for orders placed or performed during the hospital encounter of 10/08/2021  MRSA Next Gen by PCR, Nasal     Status: None   Collection Time: 11/03/2021 12:43 PM   Specimen: Nasal Mucosa; Nasal Swab  Result Value Ref Range Status   MRSA by PCR Next Gen NOT DETECTED NOT DETECTED Final    Comment: (NOTE) The GeneXpert MRSA Assay (FDA approved for NASAL specimens only), is one component of a comprehensive MRSA colonization surveillance program. It is not intended to diagnose MRSA infection nor to guide or monitor treatment for MRSA infections. Test performance is not FDA approved in patients less than 35 years old. Performed at [facility], [address] [place], [place] [address]     Coagulation Studies: No results for  input(s): "LABPROT", "INR" in the last 72 hours.  Urinalysis: Recent Labs    10/31/2021 1249  COLORURINE STRAW*  LABSPEC 1.003*  PHURINE 9.0*  GLUCOSEU NEGATIVE  HGBUR NEGATIVE  BILIRUBINUR NEGATIVE  KETONESUR 5*  PROTEINUR NEGATIVE  NITRITE NEGATIVE  LEUKOCYTESUR NEGATIVE       Imaging: DG Chest Portable 1 View  Result Date: 10/08/2021 CLINICAL DATA:  Hyponatremia. History of metastatic head and neck cancer. EXAM: PORTABLE CHEST 1 VIEW COMPARISON:  PET-CT dated Aug 07, 2021. FINDINGS: New right chest wall port catheter with tip in the proximal right atrium. The heart size and mediastinal contours are within normal limits. Both lungs are clear. The visualized skeletal structures are unremarkable. IMPRESSION: No active disease. Electronically Signed   By: Titus Dubin M.D.   On: 11/03/2021 10:30     Medications:    sodium chloride     sodium chloride 50 mL/hr at 10/10/21 9562    aspirin  81 mg Oral Daily   Chlorhexidine Gluconate Cloth  6 each Topical Daily   enoxaparin (LOVENOX) injection  30 mg Subcutaneous Q24H   fluticasone  1 spray Each Nare Daily   lidocaine-prilocaine  1 Application Topical Once   metoprolol tartrate  25 mg Oral BID   montelukast  10 mg Oral Daily   nicotine  7 mg Transdermal Daily   pantoprazole (PROTONIX) IV  40 mg Intravenous Daily   potassium chloride  40 mEq Oral BID   sodium chloride flush  3 mL Intravenous Q12H   umeclidinium bromide  1 puff Inhalation Daily   sodium chloride, acetaminophen, albuterol, diazepam, hydrALAZINE, lactulose, ondansetron **OR** ondansetron (ZOFRAN) IV, oxyCODONE, sodium chloride flush  Assessment/ Plan:  Ms. WINRY EGNEW is a 71 y.o.  female  with past medical history of COPD, GERD, hypertension, chronic hyponatremia and nasopharyngeal cancer. She presents to the ED at the request of the cancer center for abnormal labs. 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 Serum osm 252, urine osm 171. Recommend starting 3% hypertonic solution at 49m/hr. Goal sodium increase no more than 8 mmol/L in 24 hours.   3% Hypertonic saline stopped due to rapid correction. Patient placed on normal saline without sustainable recovery. Will place patient back 3% hypertonic solution at a reduced rate, 258mhr. Will continue to monitor sodium every 4 hours.  Lab Results  Component Value Date   CREATININE <0.30 (L) 10/10/2021   CREATININE 0.42 (L) 10/17/2021   CREATININE 0.36 (L) 10/06/2021    Intake/Output Summary (Last 24 hours) at 10/10/2021 1055 Last data filed at 10/10/2021 0748 Gross per 24 hour  Intake 1284.42 ml  Output 1175 ml  Net 109.42 ml    2. Hypokalemia, potassium 2.7 on admission. Potassium corrected to 3.8 with IV supplementation.  3. Hypertension, essential. Home regimen includes amlodipine and metoprolol. Currently receiving Metoprolol.   Blood pressure 118/62   LOS: 1 Inessa Wardrop 7/7/202310:55 AM

## 2021-10-10 NOTE — Evaluation (Signed)
Clinical/Bedside Swallow Evaluation Patient Details  Name: Vickie Caldwell MRN: [id number] Date of Birth: [birth year]  Today's Date: 10/10/2021 Time: SLP Start Time (ACUTE ONLY): 9 SLP Stop Time (ACUTE ONLY): 1455 SLP Time Calculation (min) (ACUTE ONLY): 60 min  Past Medical History:  Past Medical History:  Diagnosis Date   Asthma    COPD (chronic obstructive pulmonary disease) ([place])    Dysrhythmia    GERD (gastroesophageal reflux disease)    History of kidney stones    Hypertension    Osteoarthritis    TIA (transient ischemic attack) 2011   No Deficits   Vertigo    Past Surgical History:  Past Surgical History:  Procedure Laterality Date   ABDOMINAL HYSTERECTOMY     BREAST BIOPSY Left 1980's   neg. Pt states punch biopsy at the nipple   IR IMAGING GUIDED PORT INSERTION  08/14/2021   LYMPH NODE DISSECTION Right    neck   MANDIBLE SURGERY     Sagittal split   NASAL ENDOSCOPY Bilateral 07/23/2021   Procedure: NASAL ENDOSCOPY WITH BIOPSY OF NASOPHARYNGEAL MASS;  Surgeon: Clyde Canterbury, MD;  Location: [facility] ORS;  Service: ENT;  Laterality: Bilateral;   THROAT SURGERY     Vocal cord polyps "burned"   TUBAL LIGATION     WISDOM TOOTH EXTRACTION     HPI:  Pt is a 71 y.o. 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  Patient reports being unable to take her medications due to nausea for several days and complains of diarrhea as well. She states that she has a history of hyponatremia and hypokalemia but her numbers were worse today during her follow-up and so she was sent to the ER for further evaluation.  Oncology is  following; w/ Palliative Care following for GOC.   Per chart notes: April 26th, 2023-  NASOPHARYNGEAL MASS; ENDOSCOPIC BIOPSY; PET sacn May 5th, 2023-  Posterior nasopharyngeal mass maximum SUV 12.7, compatible with malignancy; Hypermetabolic right level IIb and right level IV lymph nodes compatible with malignant spread.   Oncologist reported initial tx sessions at [facility]. Pt's Sodium level at admti: 117; weight loss.   CXR at admit: No active disease.    Assessment / Plan / Recommendation  Clinical Impression   Pt seen for BSE. Pt awake, fully alert and talkative. She endorsed complete Hearing Loss and loss of smell in recent ~2 weeks -- suspect impact from Chemotherapy txs. She engaged in communication w/ this Clinician via written and verbal communication.  Pt presented w/ Dysphonia; min velopharyngeal incompetency noted. Mildly wet vocal quality noted at baseline during talking (prior to po's) -- she was sensate and cleared throat in response.    Pt appears to present w/ oropharyngeal phase dysphagia w/ primary pharyngeal phase deficits and suspected neuromuscular impact from the impact of the NASOPHARYNGEAL MASS, tx. Changes in the swallow function can increase risk for aspiration, aspiration pneumonia. Noted pt's CXR at admit was clear w/ "No active disease", though she does endorse difficulty swallowing and aspiration episodes "sometimes when I take too large a sip of liquid -- I know I have to be careful when I drink". She described dry mouth/throat making it difficult to  swallow. She also endorsed weight loss; Na deficit. Pt stated she "cannot" eat solid foods b/c they will "get stuck" -- pointing to her throat, and in her teeth. Pt does have oral dryness and uses Biotene frequently along w/ sips of water to combat the oral dryness. She reported "tiredness" w/ the "work" of eating/drinking in recent weeks.   Pt consumed trials of single ice chips, multiple trials of thin liquids via  Cup/straw and few trials of purees w/ inconsistent, overt clinical s/s of aspiration noted c/b mild and immediate throat clearing/cough. NO O2 desaturations noted, no change in RR/HR indicating distress. Laryngeal excursion was mildly reduced during the swallow. Suspect potential delay in swallow initiation along w/ pharyngeal residue. Pt was careful to monitor her sips of thin liquids independently.  Oral phase appeared adequate for bolus management and oral clearing of trials consumed. She required min increased Time for swallowing/clearing the puree foods.  OM exam was revealed dryness or oral cavity, tongue and a slight asymmetry of R lingual deviation(?); ROM and general tone WFL. Lingual strength adequate in isolation tasks though min weakness w/ sustained tasks intermittently. Noted mildly decreased articulation precision of speech.   Post discussion w/ Oncologist (in room during session) and pt as well as Hospitalist, recommend a more pureed FULL LIQUID diet as pt described she has been eating at home(yogurt, pudding, cream of wheat) w/ thin liquids. Strict aspiration precautions; f/u, Dry swallows as needed to fully clear residue. Pills Crushed in puree or in liquid form for ease and safety of swallow. Tray setup and positioning support as needed. Frequent oral care before/post intake. STOP po's if any increased s/s of aspiration noted during oral intake; Pulmonary decline.   Recommend Palliative Care f/u for discussion of GOC including alternative means of feeding to best support nutrition and to relieve demand on swallowing attempting to meet nutrition needs orally during Ca tx. MDs agreed. NSG updated.  ST services will f/u on Monday w/ MBSS -- pt agreed for objective testing to best assess swallow function currently.  SLP Visit Diagnosis: Dysphagia, oropharyngeal phase (R13.12) (in setting of NASOPHARYNGEAL MASS)    Aspiration Risk  Mild aspiration risk;Moderate aspiration risk;Risk for  inadequate nutrition/hydration    Diet Recommendation   a more pureed FULL LIQUID diet as pt described she has been eating at home(yogurt, pudding, cream of wheat) w/ thin liquids. Strict aspiration precautions; f/u, Dry swallows as needed to fully clear residue.  Medication Administration: Crushed with puree (or in liquid form)    Other  Recommendations Recommended Consults:  (Palliative Care for [place]; Dietician f/u) Oral Care Recommendations: Oral care BID;Oral care before and after PO;Patient independent with oral care (support pt) Other Recommendations:  (n/a for now)    Recommendations for follow up therapy are one component of a multi-disciplinary discharge planning process, led by the attending physician.  Recommendations may be updated based on patient status, additional functional criteria and insurance authorization.  Follow up Recommendations  (TBD)      Assistance Recommended at Discharge Set up Supervision/Assistance  Functional Status Assessment Patient has had a recent decline in their functional status and/or demonstrates limited ability to make significant improvements in function in a reasonable and predictable amount of time  Frequency and Duration min 2x/week  2 weeks       Prognosis Prognosis for Safe Diet Advancement: Guarded Barriers to Reach Goals: Time post onset;Severity of deficits Barriers/Prognosis Comment: NASOPHARYNGEAL MASS      Swallow Study   General  Date of Onset: 11/02/2021 HPI: Pt is a 71 y.o. 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  Patient reports being unable to take her medications  due to nausea for several days and complains of diarrhea as well. She states that she has a history of hyponatremia and hypokalemia but her numbers were worse today during her follow-up and so she was sent to the ER for further evaluation.  Oncology is following; w/ Palliative Care following for GOC.  Per chart notes: April 26th, 2023-  NASOPHARYNGEAL MASS; ENDOSCOPIC BIOPSY; PET sacn May 5th, 2023-  Posterior nasopharyngeal mass maximum SUV 12.7, compatible with malignancy; Hypermetabolic right level IIb and right level IV lymph nodes compatible with malignant spread.  Oncologist reported initial tx sessions at [facility]. Pt's Sodium level at admti: 117; weight loss.   CXR at admit: No active disease. Type of Study: Bedside Swallow Evaluation Previous Swallow Assessment: none Diet Prior to this Study: Regular;Thin liquids Temperature Spikes Noted: No (wbc 15.2) Respiratory Status: Room air History of Recent Intubation: No Behavior/Cognition: Alert;Cooperative;Pleasant mood Oral Cavity Assessment: Dry Oral Care Completed by SLP: Yes Oral Cavity - Dentition: Adequate natural dentition Vision: Functional for self-feeding Self-Feeding Abilities: Able to feed self;Needs set up Patient Positioning: Upright in bed Baseline Vocal Quality: Wet;Low vocal intensity (velopharyngeal incompetency - mild) Volitional Cough: Strong Volitional Swallow: Able to elicit    Oral/Motor/Sensory Function Overall Oral Motor/Sensory Function: Within functional limits (grossly - slight asymmetry of R lingual deviation(?). Lingual strength adequate though min weakness w/ sustained tasks intermittently.)   Ice Chips Ice chips: Within functional limits Presentation: Spoon (fed; 3 trials)   Thin Liquid Thin Liquid: Impaired Presentation: Cup;Self Fed;Straw (7-8 trials via each method) Oral Phase Impairments:  (n/a) Pharyngeal  Phase Impairments: Suspected delayed Swallow;Decreased hyoid-laryngeal movement;Throat  Clearing - Immediate;Cough - Immediate;Wet Vocal Quality (x2 w/ each method) Other Comments: pt endorsed she had "difficulty" if she took "larger" sips and did not control the sip size    Nectar Thick Nectar Thick Liquid: Not tested   Honey Thick Honey Thick Liquid: Not tested   Puree Puree: Impaired Presentation: Spoon;Self Fed (4 trials) Oral Phase Impairments:  (n/a) Oral Phase Functional Implications:  (n/a) Pharyngeal Phase Impairments: Decreased hyoid-laryngeal movement;Multiple swallows;Throat Clearing - Immediate (x1) Other Comments: residue?   Solid     Solid: Not tested Other Comments: pt stated unable to swallow solid foods -- they will get "stuck"         Orinda Kenner, [place], [place] Speech Language Pathologist Rehab Services; [place] [phone] (ascom) Breion Novacek 10/10/2021,6:13 PM

## 2021-10-10 NOTE — Consult Note (Signed)
MEDICATION RELATED CONSULT NOTE   Pharmacy Consult for Hypertonic Saline Infusion Indication: Hyponatremia(chronic)  Allergies  Allergen Reactions   Codeine Nausea And Vomiting   Penicillins Rash    Patient Measurements: Height: '4\' 8"'$  (142.2 cm) Weight: 43.7 kg (96 lb 5.5 oz) IBW/kg (Calculated) : 36.3  Vital Signs: Temp: 98.1 F (36.7 C) (07/06 2030) Temp Source: Oral (07/06 2030) BP: 124/66 (07/07 0100) Pulse Rate: 84 (07/07 0100) Intake/Output from previous day: 07/06 0701 - 07/07 0700 In: 947.1 [I.V.:808.1; IV Piggyback:138.9] Out: 700 [Urine:700] Intake/Output from this shift: Total I/O In: 298.1 [I.V.:298.1] Out: -   Labs: Recent Labs    10/05/2021 0854  WBC 14.1*  HGB 9.7*  HCT 29.0*  PLT 512*  CREATININE 0.42*  MG 1.7  ALBUMIN 2.6*  PROT 6.6  AST 33  ALT 31  ALKPHOS 109  BILITOT 0.6    Estimated Creatinine Clearance: 41.2 mL/min (A) (by C-G formula based on SCr of 0.42 mg/dL (L)).   Microbiology: Recent Results (from the past 720 hour(s))  MRSA Next Gen by PCR, Nasal     Status: None   Collection Time: 10/12/2021 12:43 PM   Specimen: Nasal Mucosa; Nasal Swab  Result Value Ref Range Status   MRSA by PCR Next Gen NOT DETECTED NOT DETECTED Final    Comment: (NOTE) The GeneXpert MRSA Assay (FDA approved for NASAL specimens only), is one component of a comprehensive MRSA colonization surveillance program. It is not intended to diagnose MRSA infection nor to guide or monitor treatment for MRSA infections. Test performance is not FDA approved in patients less than 37 years old. Performed at [facility], [address] [place], [place] [address]     Medical History: Past Medical History:  Diagnosis Date   Asthma    COPD (chronic obstructive pulmonary disease) ([place])    Dysrhythmia    GERD (gastroesophageal reflux disease)    History of kidney stones    Hypertension    Osteoarthritis    TIA (transient ischemic attack) 2011   No  Deficits   Vertigo     Medications:  Medications Prior to Admission  Medication Sig Dispense Refill Last Dose   amLODipine (NORVASC) 10 MG tablet Take 10 mg by mouth daily.   Past Week   diazepam (VALIUM) 5 MG tablet Take 1 tablet (5 mg total) by mouth every 12 (twelve) hours as needed. 60 tablet 0 Past Week   fluticasone (FLONASE) 50 MCG/ACT nasal spray Place into both nostrils daily.   Past Week   metoprolol succinate (TOPROL-XL) 100 MG 24 hr tablet Take 100 mg by mouth daily. Take with or immediately following a meal.   Past Week   montelukast (SINGULAIR) 10 MG tablet Take 10 mg by mouth daily.   Past Week   morphine (MS CONTIN) 30 MG 12 hr tablet Take 1 tablet (30 mg total) by mouth every 12 (twelve) hours. 60 tablet 0 Past Week   Oxycodone HCl 10 MG TABS Take 1 tablet (10 mg total) by mouth every 4 (four) hours as needed. 84 tablet 0 Past Week   potassium chloride 20 MEQ/15ML (10%) SOLN Take 15 mLs (20 mEq total) by mouth 3 (three) times daily. 473 mL 2 Past Week   umeclidinium bromide (INCRUSE ELLIPTA) 62.5 MCG/ACT AEPB Inhale 1 puff into the lungs daily.   Past Week   acetaminophen (TYLENOL) 325 MG tablet Take 650 mg by mouth every 6 (six) hours as needed.   prn at prn   albuterol (  VENTOLIN HFA) 108 (90 Base) MCG/ACT inhaler Inhale into the lungs every 6 (six) hours as needed for wheezing or shortness of breath.   prn at prn   ASPIRIN 81 PO Take by mouth daily.      Cholecalciferol (VITAMIN D3 PO) Take by mouth daily.      famotidine (PEPCID) 10 MG tablet Take 20 mg by mouth daily.      HYDROcodone-acetaminophen (NORCO) 10-325 MG tablet Take 1 tablet by mouth every 6 (six) hours as needed. 45 tablet 0 prn at prn   lactulose (CHRONULAC) 10 GM/15ML solution Take 15 mLs (10 g total) by mouth 2 (two) times daily as needed for moderate constipation. 236 mL 0 prn at prn   lidocaine-prilocaine (EMLA) cream Apply on the port. 30 -45 min  prior to port access. 30 g 3 prn at prn   ondansetron  (ZOFRAN) 8 MG tablet One pill every 8 hours as needed for nausea/vomitting. 40 tablet 1 prn at prn   potassium chloride SA (KLOR-CON M) 20 MEQ tablet 1 pill twice a day (Patient not taking: Reported on 10/31/2021) 60 tablet 3 Not Taking   promethazine (PHENERGAN) 25 MG tablet Take 0.5 tablets (12.5 mg total) by mouth every 8 (eight) hours as needed for refractory nausea / vomiting. 30 tablet 0 prn at prn   Scheduled:   aspirin  81 mg Oral Daily   Chlorhexidine Gluconate Cloth  6 each Topical Daily   enoxaparin (LOVENOX) injection  30 mg Subcutaneous Q24H   famotidine  20 mg Oral Daily   fluticasone  1 spray Each Nare Daily   lidocaine-prilocaine  1 Application Topical Once   metoprolol tartrate  25 mg Oral BID   montelukast  10 mg Oral Daily   morphine  30 mg Oral Q12H   nicotine  7 mg Transdermal Daily   potassium chloride  40 mEq Oral BID   sodium chloride flush  3 mL Intravenous Q12H   umeclidinium bromide  1 puff Inhalation Daily   Infusions:   sodium chloride     sodium chloride 50 mL/hr at 10/10/21 0100   PRN: sodium chloride, acetaminophen, albuterol, diazepam, lactulose, ondansetron **OR** ondansetron (ZOFRAN) IV, oxyCODONE, sodium chloride flush Anti-infectives (From admission, onward)    None       Assessment: Pharmacy has been consulted to assist and monitor hypertonic saline infusion in 71yo patient who was sent to the ER from the cancer center for evaluation of loss of vision, hearing, taste, and difficulty swallowing. Patient has a history of hyponatremia and hypokalemia but lab values were worse today during follow-up visit. Potassium level of 2.7, Magnesium 1.6 and sodium of 117. Thought to initially be d/t excessive fluid intake vs. SIADH, although she states she has cut back on her fluid intake as recommended. Primary team discussed with nephrology, who recommended starting patient on 3% saline.  Date/Time Na Rate 7/6'@0854'$        117      No hypertonic saline  ordered 7/6'@1301'$  124 30 ml/hr -  hypertonic saline d/c'd @ 1400 7/6'@1505'$        122      No hypertonic saline ordered 7/6'@2034'$        120      No hypertonic saline ordered 7/7'@0116'$        121      No hypertonic saline ordered  Goal of Therapy:  Increase Na by 4-6 mEq/L in 4-6 hours  Plan:  - 3% hypertonic saline started'@30ml'$ /hr'@1255'$  on 7/5, but  after initial Na level of 124 in the hospital(an increase of 7 in only a few hours), infusion has been stopped.  - recheck Na level q4h currently, with next level'@1700'$ . - contact RN and nephrologist if:  Na increases by 6 mEq/L or more in the first 4 hours  Dorna Mallet D 10/10/2021,2:00 AM

## 2021-10-10 NOTE — Consult Note (Signed)
[place] NOTE  Patient Care Team: Revelo, Elyse Jarvis, MD as PCP - General (Family Medicine) Cammie Sickle, MD as Consulting Physician (Oncology)  CHIEF COMPLAINTS/PURPOSE OF CONSULTATION: lung cancer  Oncology History Overview Note  # April 26th, 2023-  NASOPHARYNGEAL MASS; ENDOSCOPIC BIOPSY:  - POORLY DIFFERENTIATED CARCINOMA, WITH SQUAMOUS DIFFERENTIATION AND  KERATINIZATION. EBV-NEGATIVE. [Dr.Bennett: ]  Comment:  Immunohistochemical studies show tumor cells to be strongly and  diffusely positive for CK5/6 and p40, and negative for S100, CD45 (LCA),  desmin, and p16  #  PET sacn May 5th, 2023-  Posterior nasopharyngeal mass maximum SUV 12.7, compatible with malignancy; Hypermetabolic right level IIb and right level IV lymph nodes compatible with malignant spread. 3. A 4 mm in short axis left level IIb lymph node has a maximum SUV of 2.1 which is about at the level of the blood pool, probably not involved. 4. Other imaging findings of potential clinical significance: Aortic Atherosclerosis (ICD10-I70.0). Coronary and systemic atherosclerosis. Emphysema (ICD10-J43.9). Sigmoid colon diverticulosis. Scoliosis.     Electronically Signed   By: Van Clines M.D.   On: 08/08/2021 10:56  # STAGE II nasopharyngeal carcinoma [Dr.Bennett];   # MAY 23rd- cisplatin weekly-RT; 5/25- RT   Nasopharyngeal carcinoma ([place])  08/11/2021 Initial Diagnosis   Nasopharyngeal carcinoma ([place])   08/11/2021 Cancer Staging   Staging form: Pharynx - Nasopharynx, AJCC 8th Edition - Clinical: Stage II (cT2, cN1, cM0) - Signed by Cammie Sickle, MD on 08/11/2021 Stage prefix: Initial diagnosis   08/26/2021 -  Chemotherapy   Patient is on Treatment Plan : HEAD/NECK Cisplatin q7d + XRT x 6 Cycles / Cisplatin D1 + 5FU IVCI D1-4 q28d x 3 Cycles        HISTORY OF PRESENTING ILLNESS:  Vickie Caldwell 71 y.o.  female with a history of multiple medical  problems including including COPD, chronic hyponatremia; newly diagnosed nasopharyngeal cancer T2N1 stage II follows up with myself as an outpatient.    With regards history of nasopharyngeal cancer-patient diagnosed May/June 2023-when she presented to ENT for worsening headaches.  Patient subsequently started on concurrent chemo-cisplatin weekly along with radiation.  Patient had progressive generalized weakness in the last few weeks due to the radiation.  Also poor p.o. intake.  Complaining of oral ulcers.  Also positive for nausea and vomiting.  Difficulty swallowing because of soreness in the mouth.  Patient had missed 2 cycles of chemotherapy because of poor tolerance.  On the day of admission patient was evaluated in the symptom management clinic-for generalized weakness/oral ulceration noted to have severe hyponatremia with sodium 116.  Patient was sent over to the emergency room and subsequently admitted to ICU.  Since being in the ICU patient complains to have difficulty hearing.  Also complains of difficulty swallowing.  Review of Systems  Constitutional:  Positive for malaise/fatigue and weight loss. Negative for chills, diaphoresis and fever.  HENT:  Positive for congestion, sinus pain and sore throat. Negative for nosebleeds.   Eyes:  Negative for double vision.  Respiratory:  Positive for cough, shortness of breath and wheezing. Negative for hemoptysis and sputum production.   Cardiovascular:  Positive for leg swelling. Negative for chest pain, palpitations and orthopnea.  Gastrointestinal:  Positive for constipation, heartburn, nausea and vomiting. Negative for abdominal pain, blood in stool, diarrhea and melena.  Genitourinary:  Negative for dysuria, frequency and urgency.  Musculoskeletal:  Positive for back pain and joint pain.  Skin: Negative.  Negative for itching and rash.  Neurological:  Positive for dizziness, tingling, weakness and headaches. Negative for focal weakness.   Endo/Heme/Allergies:  Does not bruise/bleed easily.  Psychiatric/Behavioral:  Negative for depression. The patient is not nervous/anxious and does not have insomnia.     MEDICAL HISTORY:  Past Medical History:  Diagnosis Date   Asthma    COPD (chronic obstructive pulmonary disease) (HCC)    Dysrhythmia    GERD (gastroesophageal reflux disease)    History of kidney stones    Hypertension    Osteoarthritis    TIA (transient ischemic attack) 2011   No Deficits   Vertigo     SURGICAL HISTORY: Past Surgical History:  Procedure Laterality Date   ABDOMINAL HYSTERECTOMY     BREAST BIOPSY Left 1980's   neg. Pt states punch biopsy at the nipple   IR IMAGING GUIDED PORT INSERTION  08/14/2021   LYMPH NODE DISSECTION Right    neck   MANDIBLE SURGERY     Sagittal split   NASAL ENDOSCOPY Bilateral 07/23/2021   Procedure: NASAL ENDOSCOPY WITH BIOPSY OF NASOPHARYNGEAL MASS;  Surgeon: Clyde Canterbury, MD;  Location: [facility] ORS;  Service: ENT;  Laterality: Bilateral;   THROAT SURGERY     Vocal cord polyps "burned"   TUBAL LIGATION     WISDOM TOOTH EXTRACTION      SOCIAL HISTORY: Social History   Socioeconomic History   Marital status: Single    Spouse name: Not on file   Number of children: Not on file   Years of education: Not on file   Highest education level: Not on file  Occupational History   Not on file  Tobacco Use   Smoking status: Every Day    Packs/day: 1.00    Years: 51.00    Total pack years: 51.00    Types: Cigarettes   Smokeless tobacco: Never   Tobacco comments:    Started smoking around age 75  Vaping Use   Vaping Use: Never used  Substance and Sexual Activity   Alcohol use: Yes    Alcohol/week: 1.0 standard drink of alcohol    Types: 1 Cans of beer per week    Comment: occasional   Drug use: Never   Sexual activity: Not Currently    Birth control/protection: None  Other Topics Concern   Not on file  Social History Narrative   Smoker; sometimes  beer/wine; used to work for Therapist, 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 Lives in [place]. With daughter-ZES//brother. From [place].    Social Determinants of Health   Financial Resource Strain: Not on file  Food Insecurity: Not on file  Transportation Needs: No Transportation Needs (10/02/2021)   PRAPARE - Hydrologist (Medical): No    Lack of Transportation (Non-Medical): No  Physical Activity: Not on file  Stress: Not on file  Social Connections: Not on file  Intimate Partner Violence: Not on file    FAMILY HISTORY: Family History  Problem Relation Age of Onset   Coronary artery disease Mother    Cancer Father 68       Oral   Cancer Brother 89       testicular   Prostate cancer Brother    Heart failure Brother    Breast cancer Neg Hx     ALLERGIES:  is allergic to codeine and penicillins.  MEDICATIONS:  Current Facility-Administered Medications  Medication Dose Route Frequency Provider Last Rate Last Admin   0.9 %  sodium chloride infusion  250 mL Intravenous PRN Agbata,  Tochukwu, MD       acetaminophen (TYLENOL) tablet 650 mg  650 mg Oral Q6H PRN Agbata, Tochukwu, MD       albuterol (PROVENTIL) (2.5 MG/3ML) 0.083% nebulizer solution 3 mL  3 mL Inhalation Q6H PRN Agbata, Tochukwu, MD       aspirin chewable tablet 81 mg  81 mg Oral Daily Agbata, Tochukwu, MD   81 mg at 10/10/21 1128   Chlorhexidine Gluconate Cloth 2 % PADS 6 each  6 each Topical Daily Agbata, Tochukwu, MD   6 each at 10/10/21 1000   diazepam (VALIUM) tablet 5 mg  5 mg Oral Q12H PRN Agbata, Tochukwu, MD       enoxaparin (LOVENOX) injection 30 mg  30 mg Subcutaneous Q24H Lockie Mola B, RPH   30 mg at 10/10/21 2146   fluticasone (FLONASE) 50 MCG/ACT nasal spray 1 spray  1 spray Each Nare Daily Agbata, Tochukwu, MD   1 spray at 10/10/21 1127   hydrALAZINE (APRESOLINE) injection 10 mg  10 mg Intravenous Q6H PRN Emeterio Reeve, DO       lactulose (CHRONULAC) 10 GM/15ML solution 10  g  10 g Oral BID PRN Agbata, Tochukwu, MD       lidocaine-prilocaine (EMLA) cream 1 Application  1 Application Topical Once Agbata, Tochukwu, MD       metoprolol tartrate (LOPRESSOR) tablet 25 mg  25 mg Oral BID Agbata, Tochukwu, MD   25 mg at 10/10/21 2147   montelukast (SINGULAIR) tablet 10 mg  10 mg Oral Daily Agbata, Tochukwu, MD   10 mg at 10/10/21 1128   morphine CONCENTRATE 10 MG/0.5ML oral solution 5-10 mg  5-10 mg Oral Q4H PRN Borders, Kirt Boys, NP       nicotine (NICODERM CQ - dosed in mg/24 hr) patch 7 mg  7 mg Transdermal Daily Agbata, Tochukwu, MD   7 mg at 10/10/21 1055   ondansetron (ZOFRAN) tablet 4 mg  4 mg Oral Q6H PRN Agbata, Tochukwu, MD   4 mg at 10/10/21 2149   Or   ondansetron (ZOFRAN) injection 4 mg  4 mg Intravenous Q6H PRN Agbata, Tochukwu, MD       pantoprazole (PROTONIX) injection 40 mg  40 mg Intravenous Daily Emeterio Reeve, DO   40 mg at 10/10/21 1126   potassium chloride (KLOR-CON) packet 40 mEq  40 mEq Oral BID Agbata, Tochukwu, MD   40 mEq at 10/10/21 2147   sodium chloride (hypertonic) 3 % solution   Intravenous Continuous Breeze, Benancio Deeds, NP 20 mL/hr at 10/11/21 0000 Infusion Verify at 10/11/21 0000   sodium chloride flush (NS) 0.9 % injection 3 mL  3 mL Intravenous Q12H Agbata, Tochukwu, MD   3 mL at 10/10/21 2147   sodium chloride flush (NS) 0.9 % injection 3 mL  3 mL Intravenous PRN Agbata, Tochukwu, MD       umeclidinium bromide (INCRUSE ELLIPTA) 62.5 MCG/ACT 1 puff  1 puff Inhalation Daily Agbata, Tochukwu, MD   1 puff at 10/10/21 1127   Facility-Administered Medications Ordered in Other Encounters  Medication Dose Route Frequency Provider Last Rate Last Admin   heparin lock flush 100 UNIT/ML injection             PHYSICAL EXAMINATION:  Vitals:   10/10/21 2300 10/11/21 0000  BP: 123/64 115/68  Pulse: 71 74  Resp: 20 (!) 21  Temp:    SpO2: 97% 98%   Filed Weights   11/02/2021 1004 10/29/2021 1242  Weight: 93 lb 0.6 oz (  42.2 kg) 96 lb 5.5 oz  (43.7 kg)  Thin cachectic appearing female patient resting in the bed comfortably.  In ICU.  Nasal cannula oxygen.  Physical Exam Vitals and nursing note reviewed.  HENT:     Head: Normocephalic and atraumatic.     Mouth/Throat:     Pharynx: Oropharynx is clear.  Eyes:     Extraocular Movements: Extraocular movements intact.     Pupils: Pupils are equal, round, and reactive to light.  Cardiovascular:     Rate and Rhythm: Normal rate and regular rhythm.  Pulmonary:     Comments: Decreased breath sounds bilaterally.  Abdominal:     Palpations: Abdomen is soft.  Musculoskeletal:        General: Normal range of motion.     Cervical back: Normal range of motion.  Skin:    General: Skin is warm.  Neurological:     General: No focal deficit present.     Mental Status: She is alert and oriented to person, place, and time.  Psychiatric:        Behavior: Behavior normal.        Judgment: Judgment normal.     LABORATORY DATA:  I have reviewed the data as listed Lab Results  Component Value Date   WBC 15.2 (H) 10/10/2021   HGB 8.9 (L) 10/10/2021   HCT 26.8 (L) 10/10/2021   MCV 82.5 10/10/2021   PLT 511 (H) 10/10/2021   Recent Labs    10/03/21 0838 10/06/21 1338 11/03/2021 0854 10/25/2021 1301 10/10/21 0507 10/10/21 0903 10/10/21 1330 10/10/21 2200  NA 122* 120* 117*   < > 122* 120* 120* 121*  K 2.7* 3.2* 2.7*  --  3.8  --   --   --   CL 84* 80* 76*  --  87*  --   --   --   CO2 '28 27 28  '$ --  27  --   --   --   GLUCOSE 132* 126* 158*  --  121*  --   --   --   BUN <5* 9 10  --  <5*  --   --   --   CREATININE 0.33* 0.36* 0.42*  --  <0.30*  --   --   --   CALCIUM 8.6* 8.6* 8.4*  --  8.0*  --   --   --   GFRNONAA >60 >60 >60  --  NOT CALCULATED  --   --   --   PROT 6.6 6.4* 6.6  --   --   --   --   --   ALBUMIN 2.6* 2.6* 2.6*  --   --   --   --   --   AST 21 25 33  --   --   --   --   --   ALT '23 26 31  '$ --   --   --   --   --   ALKPHOS 93 94 109  --   --   --   --   --    BILITOT 0.6 0.7 0.6  --   --   --   --   --    < > = values in this interval not displayed.    RADIOGRAPHIC STUDIES: I have personally reviewed the radiological images as listed and agreed with the findings in the report. DG Chest Portable 1 View  Result Date: 10/13/2021 CLINICAL DATA:  Hyponatremia. History of metastatic head  and neck cancer. EXAM: PORTABLE CHEST 1 VIEW COMPARISON:  PET-CT dated Aug 07, 2021. FINDINGS: New right chest wall port catheter with tip in the proximal right atrium. The heart size and mediastinal contours are within normal limits. Both lungs are clear. The visualized skeletal structures are unremarkable. IMPRESSION: No active disease. Electronically Signed   By: Titus Dubin M.D.   On: 10/08/2021 33:46    71 year old female patient history of COPD; chronic hyponatremia also stage II 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  #Nasopharyngeal cancer: Stage II concurrent cisplatin radiation.  Tolerating poorly [oral mucositis; severe hyponatremia; dehydration; hearing loss-see below].   #Acute oral mucositis-secondary chemoradiation.    # Severe hyponatremia-acute on chronic-secondary chemotherapy/dehydration.  Appreciate nephrology evaluation/recommendations.  #Profound weight loss-secondary to malignancy/chemoradiation.  #Recommendations:  # Hold chemotherapy/radiation given acute side effects.  #Appreciate nephrology evaluation/treatment for severe hyponatremia.  #Headaches/hearing difficulty-unclear if this is progression of malignancy versus side effects of treatment.  Recommend MRI of the brain.   #With regards to weight loss/profound malnutrition-secondary to oral mucositis discussed regarding the use of PEG tube.  Patient is interested however wants to talk to family.  Also discussed with speech pathology.  #Recommend palliative care evaluation  given patient's acute issues.  Discussed with [facility].  Thank you Dr. Sheppard Coil for allowing me to participate in the care of your pleasant patient. Please do not hesitate to contact me with questions or concerns in the interim.      Cammie Sickle, MD

## 2021-10-10 NOTE — Consult Note (Addendum)
[place] for 3% NaCl monitoring  Indication: Acute on chronic hyponatremia  Patient Measurements: Height: '4\' 8"'$  (142.2 cm) Weight: 43.7 kg (96 lb 5.5 oz) IBW/kg (Calculated) : 36.3  Intake/Output from previous day: 07/06 0701 - 07/07 0700 In: 1194.4 [I.V.:1055.5; IV Piggyback:138.9] Out: 1175 [TUUEK:8003] Intake/Output from this shift: Total I/O In: 90 [I.V.:90] Out: -   Labs: Recent Labs    10/21/2021 0854 10/10/21 0507  WBC 14.1* 15.2*  HGB 9.7* 8.9*  HCT 29.0* 26.8*  PLT 512* 511*  CREATININE 0.42* <0.30*  MG 1.7  --   ALBUMIN 2.6*  --   PROT 6.6  --   AST 33  --   ALT 31  --   ALKPHOS 109  --   BILITOT 0.6  --     CrCl cannot be calculated (This lab value cannot be used to calculate CrCl because it is not a number: <0.30).  Medical History: Past Medical History:  Diagnosis Date   Asthma    COPD (chronic obstructive pulmonary disease) (HCC)    Dysrhythmia    GERD (gastroesophageal reflux disease)    History of kidney stones    Hypertension    Osteoarthritis    TIA (transient ischemic attack) 2011   No Deficits   Vertigo    Infusions:   sodium chloride     sodium chloride 50 mL/hr at 10/10/21 0748   Assessment: Pharmacy has been consulted to assist and monitor hypertonic saline infusion in 71yo patient who was sent to the ER from the cancer center for evaluation of loss of vision, hearing, taste, and difficulty swallowing. Patient has a history of hyponatremia and hypokalemia but lab values were worse today during follow-up visit. Potassium level of 2.7, Magnesium 1.6 and sodium of 117. Thought to initially be d/t excessive fluid intake vs. SIADH, although she states she has cut back on her fluid intake as recommended. Primary team discussed with nephrology, who recommended starting patient on 3% saline.  Date/Time Na Rate 7/6 0854 117      N/A, admission value 7/6 1159 N/A 3% NaCl started at 30 cc/hr 7/6  1301 124 3% NaCl at 30 cc/hr 7/6 1400 N/A 3% NaCl discontinued 7/6 1505 122      N/A 7/6 1600 N/A 0.9% NaCl started at 50 cc/hr 7/6 2034 120      0.9% NaCl at 50 cc/hr 7/7 0116 121      0.9% NaCl at 50 cc/hr 7/7 0507 122      0.9% NaCl at 50 cc/hr 7/7 0903 120 0.9% NaCl at 50 cc/hr  Goal of Therapy:  Contact provider if Na increases > 4 mmol/L over 2 hours or > 6 mmol/L over 4 hours Increase Na by 8 - 12 mmol / L / day  Plan:  --Sodium has increased 3 mmol/L over preceding 24 hours --3% NaCl to be resumed per nephrology at 20 cc/hr. Discontinued 0.9% NaCl at 50 cc/hr --Fluid restriction: 1.2L --Continue to monitor plan per nephrology  Benita Gutter 10/10/2021,11:39 AM

## 2021-10-10 NOTE — Telephone Encounter (Signed)
Patient Vickie Caldwell) called stating his grandmother was in the hospital and he asked the Doctor treating his grandmother how was the cancer doing and if the chemo was working, Doctor informed him that he has to contact her Oncologist for that information.   After speaking with the patient grandson he states that his grandmother isn't telling the family anything dealing with her cancer, she is hard of hearing and he feels like she not really remembering everything she being told at her appointments. Patient grandson gave him the OKAY to call over here to see if we could provide accurate information about her cancer. Patient grandson states he wants to know "if the cancer is shrinking? What stage cancer does she have? How much more chemo does she have? Since her last radiation treatment is coming up on 7/19. Is the chemo working?" I did tell the patient grandson that she has a upcoming appointment on 7/13 for a port flush/lab and to see MD and I think its best that him or a family member come with the patient to appointments (if she allows them) to get a better understanding of what's going on with her cancer. Patient grandson stated he would  be with her her next appointment but still would like phone callback just to get an idea of the cancer stage and if the chemo is working (Just in case she isn't able to make it to 7/13 appointment due current admission.) CALLBACK # [phone]

## 2021-10-10 NOTE — Progress Notes (Signed)
Error

## 2021-10-10 NOTE — Telephone Encounter (Signed)
Dr. B asked me to forward to you.

## 2021-10-11 ENCOUNTER — Other Ambulatory Visit: Payer: Self-pay | Admitting: Nurse Practitioner

## 2021-10-11 ENCOUNTER — Inpatient Hospital Stay: Payer: Medicare HMO

## 2021-10-11 ENCOUNTER — Encounter: Payer: Self-pay | Admitting: Radiology

## 2021-10-11 DIAGNOSIS — E871 Hypo-osmolality and hyponatremia: Secondary | ICD-10-CM | POA: Diagnosis not present

## 2021-10-11 LAB — BASIC METABOLIC PANEL
Anion gap: 10 (ref 5–15)
BUN: <5 mg/dL — ABNORMAL LOW (ref 8–23)
CO2: 25 mmol/L (ref 22–32)
Calcium: 8.9 mg/dL (ref 8.9–10.3)
Chloride: 90 mmol/L — ABNORMAL LOW (ref 98–111)
Creatinine, Ser: 0.31 mg/dL — ABNORMAL LOW (ref 0.44–1.00)
GFR, Estimated: >60 mL/min (ref >60)
Glucose, Bld: 111 mg/dL — ABNORMAL HIGH (ref 70–99)
Potassium: 3.6 mmol/L (ref 3.5–5.1)
Sodium: 125 mmol/L — ABNORMAL LOW (ref 135–145)

## 2021-10-11 LAB — CBC
HCT: 30.2 % — ABNORMAL LOW (ref 36.0–46.0)
Hemoglobin: 9.8 g/dL — ABNORMAL LOW (ref 12.0–15.0)
MCH: 27.3 pg (ref 26.0–34.0)
MCHC: 32.5 g/dL (ref 30.0–36.0)
MCV: 84.1 fL (ref 80.0–100.0)
Platelets: 520 10*3/uL — ABNORMAL HIGH (ref 150–400)
RBC: 3.59 MIL/uL — ABNORMAL LOW (ref 3.87–5.11)
RDW: 13 % (ref 11.5–15.5)
WBC: 14 10*3/uL — ABNORMAL HIGH (ref 4.0–10.5)
nRBC: 0 % (ref 0.0–0.2)

## 2021-10-11 LAB — SODIUM
Sodium: 122 mmol/L — ABNORMAL LOW (ref 135–145)
Sodium: 125 mmol/L — ABNORMAL LOW (ref 135–145)
Sodium: 125 mmol/L — ABNORMAL LOW (ref 135–145)
Sodium: 127 mmol/L — ABNORMAL LOW (ref 135–145)
Sodium: 127 mmol/L — ABNORMAL LOW (ref 135–145)

## 2021-10-11 MED ORDER — GADOBUTROL 1 MMOL/ML IV SOLN
4.0000 mL | Freq: Once | INTRAVENOUS | Status: AC | PRN
Start: 2021-10-11 — End: 2021-10-11
  Administered 2021-10-11: 4 mL via INTRAVENOUS

## 2021-10-11 MED ORDER — SODIUM CHLORIDE 3 % IV SOLN
INTRAVENOUS | Status: DC
  Administered 2021-10-11: 10 mL/h via INTRAVENOUS
  Filled 2021-10-11: qty 500

## 2021-10-11 MED ORDER — METOPROLOL TARTRATE 25 MG PO TABS
12.5000 mg | ORAL_TABLET | Freq: Two times a day (BID) | ORAL | Status: DC
  Administered 2021-10-11 – 2021-10-12 (×3): 12.5 mg via ORAL
  Filled 2021-10-11 (×3): qty 1

## 2021-10-11 MED ORDER — POTASSIUM CHLORIDE 20 MEQ PO PACK: 40.0000 meq | PACK | Freq: Once | ORAL | Status: DC

## 2021-10-11 NOTE — Consult Note (Signed)
[place] for 3% NaCl monitoring  Indication: Acute on chronic hyponatremia  Patient Measurements: Height: '4\' 8"'$  (142.2 cm) Weight: 43.7 kg (96 lb 5.5 oz) IBW/kg (Calculated) : 36.3  Intake/Output from previous day: 07/07 0701 - 07/08 0700 In: 639.9 [I.V.:639.9] Out: 1550 [Urine:1550] Intake/Output from this shift: Total I/O In: 73 [I.V.:73] Out: 200 [Urine:200]  Labs: Recent Labs    10/29/2021 0854 10/10/21 0507 10/11/21 0722  WBC 14.1* 15.2* 14.0*  HGB 9.7* 8.9* 9.8*  HCT 29.0* 26.8* 30.2*  PLT 512* 511* 520*  CREATININE 0.42* <0.30* 0.31*  MG 1.7  --   --   ALBUMIN 2.6*  --   --   PROT 6.6  --   --   AST 33  --   --   ALT 31  --   --   ALKPHOS 109  --   --   BILITOT 0.6  --   --     Estimated Creatinine Clearance: 41.2 mL/min (A) (by C-G formula based on SCr of 0.31 mg/dL (L)).  Medical History: Past Medical History:  Diagnosis Date   Asthma    COPD (chronic obstructive pulmonary disease) (HCC)    Dysrhythmia    GERD (gastroesophageal reflux disease)    History of kidney stones    Hypertension    Osteoarthritis    TIA (transient ischemic attack) 2011   No Deficits   Vertigo    Infusions:   sodium chloride     sodium chloride (hypertonic) 10 mL/hr at 10/11/21 2409   Assessment: Pharmacy has been consulted to assist and monitor hypertonic saline infusion in 70yo patient who was sent to the ER from the cancer center for evaluation of loss of vision, hearing, taste, and difficulty swallowing. Patient has a history of hyponatremia and hypokalemia but lab values were worse today during follow-up visit. Potassium level of 2.7, Magnesium 1.6 and sodium of 117. Thought to initially be d/t excessive fluid intake vs. SIADH, although she states she has cut back on her fluid intake as recommended. Primary team discussed with nephrology, who recommended starting patient on 3% saline.  Date/Time Na Rate 7/6 0854 117      N/A,  admission value 7/6 1159 N/A 3% NaCl started at 30 ml/hr 7/6 1301 124 3% NaCl at 30 ml/hr 7/6 1400 N/A 3% NaCl discontinued 7/6 1505 122      N/A 7/6 1600 N/A 0.9% NaCl started at 50 ml/hr 7/6 2034 120      0.9% NaCl at 50 ml/hr 7/7 0116 121      0.9% NaCl at 50 ml/hr 7/7 0507 122      0.9% NaCl at 50 ml/hr 7/7 0903 120 0.9% NaCl at 50 ml/hr 7/7 1241 N/A 3% NaCl started at 20 ml/hr 7/7 1330 120 3% NaCl at 20 ml/hr 7/7 2200          121     3% NaCl at 20 ml/hr 7/8 0202          122     3% NaCl at 20 ml/hr 7/8 0722   125 3% NaCl at 20 ml/hr 7/8 1329  125 3% NaCl at 10 ml/hr 7/8 1704           127     3% NaCl at 10 ml/hr  Goal of Therapy:  Contact provider if Na increases > 4 mmol/L over 2 hours or > 6 mmol/L over 4 hours Increase Na by 8 - 12  mmol / L / day  Plan:  --Continue 3% NaCl at 10 cc/hr --Fluid restriction: 1.2L --Sodium checks q4h --Continue to monitor plan per nephrology  Paulina Fusi, PharmD, BCPS 10/11/2021 5:48 PM

## 2021-10-11 NOTE — Progress Notes (Signed)
PROGRESS NOTE    Vickie Caldwell  VPX:[id number] DOB: 08-May-1950  DOA: 10/27/2021 Date of Service: 10/11/21 PCP: Theotis Burrow, MD     Brief Narrative / Hospital Course:  Vickie Caldwell is a 71 y.o. 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 Patient reports being unable to take her medications due to nausea for several days and complains of diarrhea as well. She states that she has a history of hyponatremia and hypokalemia but her numbers were worse today during her follow-up and so she was sent to the ER for further evaluation. 07/06: Sodium 117, K 2.7, Lg 1.6, Started on 3% saline per nephrology recs.  07/07: Nephrology following.  Was on 3% saline, on NS without much improvement, back on 3%.  Oncology including palliative care NP are following.   Family is requesting update regarding oncologic status/prognosis, I deferred detailed discussion to oncology team.  Per palliative notes, family to speak further regarding possible PEG placement for nutrition. MRI obtained to eval possible tumor extension vs CVA as cause for hearing loss -no acute intracranial abnormality, nasopharyngeal carcinoma with large area of abnormal contrast-enhancement extending into prevertebral and parapharyngeal soft tissues and clivus, bilateral mastoid effusions and moderate thickening sphenoid sinuses. 07/08: sodium slowly improving, now 125. WBC trending down to 14 (from 15). SPOke w/ patient I had to write down a lot to communicate w/ her, very HoH. She's ok w/ a feeding tube if needed.   Consultants:  Nephrology Oncology  Procedures: none    Subjective: She is tired today, mouth/throat hurts. SHe is frustrated she is unable to eat d/t  the pain/dryness and keeps choking.      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 Oncology team following while inpatient, including oncology palliative care NP Pt reports difficulty swallowing -->  changed pain meds and GERD tx to IV added IV hydralazine prn HTN SLP eval would need to consider feasibility of tube feeds if she is not able to take p.o., appreciate oncology input regarding prognosis to aid in decision making with his measure if needed - based on my discussion w/ patient today she is amenable to feeding tube   Hyponatremia Thought to be due to excessive fluid intake versus SIADH Patient states that she has cut back on her fluid intake as recommended She has worsening hyponatremia, and her sodium level is down to 117 on admission 07/06, associated with weakness and some confusion Admitting team d/w with nephrology who recommended to start patient on 3% saline 07/06, this was stopped due to rapid correction, placed on NS without sustainable recovery, back on 3% at reduced rate and continue to monitor every 4 hours Nephrology consulted and following closely.  Hypomagnesemia Most likely related to hypokalemia Supplement magnesium Follow levels  Hypokalemia Noted to have severe hypokalemia Supplement potassium --> improved to 3.8 Continue serial BMP Check magnesium levels  Current smoker Smoking cessation discussed with patient in detail nicotine transdermal patch 7 mg daily  Benign essential hypertension  Hold home amlodipine Continue metoprolol but at one half has scheduled dose, 50 mg daily, may not be able to take this po and if  so will cancel this  Hydralazine prn HTN    DVT prophylaxis: lovenox  Code Status: FULL Family Communication: requested I call brother Antony Haste, will call him later today and update note if I'm able to reach him.  Disposition Plan / TOC needs: remains inpatient appropriate, anticipate d/c to home w/ HH vs SNF, will ask PT/OT to assess once clinically improved  Barriers to discharge / significant pending items: hyponatremia requiring close monitoring and IV medications, anticipate discharge once sodium is corrected and stays stable, appreciate nephrology input going forward re: sodium, appreciate oncology and palliative input re: GOC. May need to place PEG here.              Objective: Vitals:   10/11/21 0615 10/11/21 0616 10/11/21 0700 10/11/21 0800  BP: (!) 106/58 (!) 106/58 108/84   Pulse: 93 88 (!) 117 96  Resp: 19 20 (!) 22 20  Temp:      TempSrc:      SpO2: 96% 97% 98% 99%  Weight:      Height:        Intake/Output Summary (Last 24 hours) at 10/11/2021 1112 Last data filed at 10/11/2021 0940 Gross per 24 hour  Intake 622.92 ml  Output 1750 ml  Net -1127.08 ml   Filed Weights   10/05/2021 1004 10/10/2021 1242  Weight: 42.2 kg 43.7 kg    Examination:  Constitutional:  VS as above General Appearance: thin, NAD Neck: No masses, trachea midline Respiratory: Normal respiratory effort Breath sounds normal, no wheeze/rhonchi/rales Cardiovascular: S1/S2 normal, no murmur/rub/gallop auscultated No lower extremity edema Gastrointestinal: Nontender, no masses Musculoskeletal:  No clubbing/cyanosis of digits Neurological: No cranial nerve deficit on limited exam Motor and sensation intact and symmetric Psychiatric: Normal judgment/insight Normal mood and affect       Scheduled Medications:   aspirin  81 mg Oral Daily   Chlorhexidine Gluconate Cloth  6 each Topical Daily   enoxaparin (LOVENOX) injection  30 mg Subcutaneous Q24H   fluticasone  1 spray Each  Nare Daily   lidocaine-prilocaine  1 Application Topical Once   metoprolol tartrate  12.5 mg Oral BID   montelukast  10 mg Oral Daily   nicotine  7 mg Transdermal Daily   pantoprazole (PROTONIX) IV  40 mg Intravenous Daily   potassium chloride  40 mEq Oral BID   potassium chloride  40 mEq Oral Once   sodium chloride flush  3 mL Intravenous Q12H   umeclidinium bromide  1 puff Inhalation Daily    Continuous Infusions:  sodium chloride     sodium chloride (hypertonic) 10 mL/hr at 10/11/21 0851    PRN Medications:  sodium chloride, acetaminophen, albuterol, diazepam, hydrALAZINE, lactulose, morphine CONCENTRATE, ondansetron **OR** ondansetron (ZOFRAN) IV, sodium chloride flush  Antimicrobials:  Anti-infectives (From admission, onward)    None       Data Reviewed: I have personally reviewed following labs and imaging studies  CBC: Recent Labs  Lab 10/06/21 1338 10/07/2021 0854 10/10/21 0507 10/11/21 0722  WBC 12.1* 14.1* 15.2* 14.0*  NEUTROABS 10.2* 12.4*  --   --   HGB 8.8* 9.7* 8.9* 9.8*  HCT 26.3* 29.0* 26.8* 30.2*  MCV 83.8 82.6 82.5 84.1  PLT 446* 512* 511* 616*   Basic Metabolic Panel: Recent Labs  Lab 10/06/21 1338 10/23/2021 0854 11/01/2021 1301 10/10/21 0507 10/10/21 0903 10/10/21  1330 10/10/21 2200 10/11/21 0202 10/11/21 0722 10/11/21 0915  NA 120* 117*   < > 122*   < > 120* 121* 122* 125* 125*  K 3.2* 2.7*  --  3.8  --   --   --   --  3.6  --   CL 80* 76*  --  87*  --   --   --   --  90*  --   CO2 27 28  --  27  --   --   --   --  25  --   GLUCOSE 126* 158*  --  121*  --   --   --   --  111*  --   BUN 9 10  --  <5*  --   --   --   --  <5*  --   CREATININE 0.36* 0.42*  --  <0.30*  --   --   --   --  0.31*  --   CALCIUM 8.6* 8.4*  --  8.0*  --   --   --   --  8.9  --   MG 1.7 1.7  --   --   --   --   --   --   --   --    < > = values in this interval not displayed.   GFR: Estimated Creatinine Clearance: 41.2 mL/min (A) (by C-G formula based on SCr of  0.31 mg/dL (L)). Liver Function Tests: Recent Labs  Lab 10/06/21 1338 10/31/2021 0854  AST 25 33  ALT 26 31  ALKPHOS 94 109  BILITOT 0.7 0.6  PROT 6.4* 6.6  ALBUMIN 2.6* 2.6*   No results for input(s): "LIPASE", "AMYLASE" in the last 168 hours. No results for input(s): "AMMONIA" in the last 168 hours. Coagulation Profile: No results for input(s): "INR", "PROTIME" in the last 168 hours. Cardiac Enzymes: No results for input(s): "CKTOTAL", "CKMB", "CKMBINDEX", "TROPONINI" in the last 168 hours. BNP (last 3 results) No results for input(s): "PROBNP" in the last 8760 hours. HbA1C: No results for input(s): "HGBA1C" in the last 72 hours. CBG: No results for input(s): "GLUCAP" in the last 168 hours. Lipid Profile: No results for input(s): "CHOL", "HDL", "LDLCALC", "TRIG", "CHOLHDL", "LDLDIRECT" in the last 72 hours. Thyroid Function Tests: No results for input(s): "TSH", "T4TOTAL", "FREET4", "T3FREE", "THYROIDAB" in the last 72 hours. Anemia Panel: No results for input(s): "VITAMINB12", "FOLATE", "FERRITIN", "TIBC", "IRON", "RETICCTPCT" in the last 72 hours. Urine analysis:    Component Value Date/Time   COLORURINE STRAW (A) 10/15/2021 1249   APPEARANCEUR CLEAR (A) 10/10/2021 1249   LABSPEC 1.003 (L) 10/20/2021 1249   PHURINE 9.0 (H) 10/14/2021 1249   GLUCOSEU NEGATIVE 10/10/2021 1249   HGBUR NEGATIVE 10/29/2021 1249   BILIRUBINUR NEGATIVE 10/10/2021 1249   KETONESUR 5 (A) 10/14/2021 1249   PROTEINUR NEGATIVE 10/23/2021 1249   NITRITE NEGATIVE 10/14/2021 1249   LEUKOCYTESUR NEGATIVE 10/23/2021 1249   Sepsis Labs: '@LABRCNTIP'$ (procalcitonin:4,lacticidven:4)  Recent Results (from the past 240 hour(s))  MRSA Next Gen by PCR, Nasal     Status: None   Collection Time: 10/19/2021 12:43 PM   Specimen: Nasal Mucosa; Nasal Swab  Result Value Ref Range Status   MRSA by PCR Next Gen NOT DETECTED NOT DETECTED Final    Comment: (NOTE) The GeneXpert MRSA Assay (FDA approved for NASAL  specimens only), is one component of a comprehensive MRSA colonization surveillance program. It is not intended to diagnose MRSA infection nor to  guide or monitor treatment for MRSA infections. Test performance is not FDA approved in patients less than 37 years old. Performed at [facility], [address] [place], [place] [address]          Radiology Studies last 96 hours: MR BRAIN W WO CONTRAST  Result Date: 10/11/2021 CLINICAL DATA:  Chronic headache EXAM: MRI HEAD WITHOUT AND WITH CONTRAST TECHNIQUE: Multiplanar, multiecho pulse sequences of the brain and surrounding structures were obtained without and with intravenous contrast. CONTRAST:  59m GADAVIST GADOBUTROL 1 MMOL/ML IV SOLN COMPARISON:  Head CT 08/07/2021 Head CT 06/10/2021 FINDINGS: Brain: No acute infarct, mass effect or extra-axial collection. No acute or chronic hemorrhage. There is multifocal hyperintense T2-weighted signal within the white matter. Generalized volume loss. The midline structures are normal. Vascular: Major flow voids are preserved. Skull and upper cervical spine: Normal calvarium and skull base. Visualized upper cervical spine and soft tissues are normal. Sinuses/Orbits:Bilateral mastoid effusions. Moderate mucosal thickening of the sphenoid sinuses. Normal orbits. There is abnormal contrast enhancement of the nasopharynx and prevertebral soft tissues. Diffusely abnormal bone marrow signal within the clivus, which is likely invaded by adjacent tumor. Abnormal contrast enhancement extends bilaterally into the parapharyngeal spaces. IMPRESSION: 1. No acute intracranial abnormality. 2. No nasopharyngeal carcinoma with large area of abnormal contrast enhancement extending into the prevertebral and parapharyngeal soft tissues as well as invading the clivus. 3. Bilateral mastoid effusions and moderate mucosal thickening of the sphenoid sinuses. Electronically Signed   By: KUlyses JarredM.D.   On: 10/11/2021  02:05   DG Chest Portable 1 View  Result Date: 10/28/2021 CLINICAL DATA:  Hyponatremia. History of metastatic head and neck cancer. EXAM: PORTABLE CHEST 1 VIEW COMPARISON:  PET-CT dated Aug 07, 2021. FINDINGS: New right chest wall port catheter with tip in the proximal right atrium. The heart size and mediastinal contours are within normal limits. Both lungs are clear. The visualized skeletal structures are unremarkable. IMPRESSION: No active disease. Electronically Signed   By: WTitus DubinM.D.   On: 10/20/2021 10:30            LOS: 2 days    Time spent: 50 min    NEmeterio Reeve DO [facility] 10/11/2021, 11:12 AM   Staff may message me via secure chat in [place] but this may not receive immediate response,  please page for urgent matters!  If 7PM-7AM, please contact night-coverage [email or web address]  Dictation software was used to generate the above note. Typos may occur and escape review, as with typed/written notes. Please contact Dr ASheppard Coildirectly for clarity if needed.

## 2021-10-11 NOTE — Consult Note (Signed)
[place] for 3% NaCl monitoring  Indication: Acute on chronic hyponatremia  Patient Measurements: Height: '4\' 8"'$  (142.2 cm) Weight: 43.7 kg (96 lb 5.5 oz) IBW/kg (Calculated) : 36.3  Intake/Output from previous day: 07/07 0701 - 07/08 0700 In: 639.9 [I.V.:639.9] Out: 1550 [Urine:1550] Intake/Output from this shift: Total I/O In: 73 [I.V.:73] Out: 125 [Urine:125]  Labs: Recent Labs    11/03/2021 0854 10/10/21 0507 10/11/21 0722  WBC 14.1* 15.2* 14.0*  HGB 9.7* 8.9* 9.8*  HCT 29.0* 26.8* 30.2*  PLT 512* 511* 520*  CREATININE 0.42* <0.30* 0.31*  MG 1.7  --   --   ALBUMIN 2.6*  --   --   PROT 6.6  --   --   AST 33  --   --   ALT 31  --   --   ALKPHOS 109  --   --   BILITOT 0.6  --   --     Estimated Creatinine Clearance: 41.2 mL/min (A) (by C-G formula based on SCr of 0.31 mg/dL (L)).  Medical History: Past Medical History:  Diagnosis Date   Asthma    COPD (chronic obstructive pulmonary disease) (HCC)    Dysrhythmia    GERD (gastroesophageal reflux disease)    History of kidney stones    Hypertension    Osteoarthritis    TIA (transient ischemic attack) 2011   No Deficits   Vertigo    Infusions:   sodium chloride     sodium chloride (hypertonic) 10 mL/hr at 10/11/21 7408   Assessment: Pharmacy has been consulted to assist and monitor hypertonic saline infusion in 70yo patient who was sent to the ER from the cancer center for evaluation of loss of vision, hearing, taste, and difficulty swallowing. Patient has a history of hyponatremia and hypokalemia but lab values were worse today during follow-up visit. Potassium level of 2.7, Magnesium 1.6 and sodium of 117. Thought to initially be d/t excessive fluid intake vs. SIADH, although she states she has cut back on her fluid intake as recommended. Primary team discussed with nephrology, who recommended starting patient on 3% saline.  Date/Time Na Rate 7/6 0854 117      N/A,  admission value 7/6 1159 N/A 3% NaCl started at 30 cc/hr 7/6 1301 124 3% NaCl at 30 cc/hr 7/6 1400 N/A 3% NaCl discontinued 7/6 1505 122      N/A 7/6 1600 N/A 0.9% NaCl started at 50 cc/hr 7/6 2034 120      0.9% NaCl at 50 cc/hr 7/7 0116 121      0.9% NaCl at 50 cc/hr 7/7 0507 122      0.9% NaCl at 50 cc/hr 7/7 0903 120 0.9% NaCl at 50 cc/hr 7/7 1241 N/A 3% NaCl started at 20 cc/hr 7/7 1330 120 3% NaCl at 20 cc/hr 7/7 2200          121     3% NaCl at 20 cc/hr 7/8 0202          122     3% NaCl at 20 cc/hr 7/8 0722   125 3% NaCl at 20 cc/hr  Goal of Therapy:  Contact provider if Na increases > 4 mmol/L over 2 hours or > 6 mmol/L over 4 hours Increase Na by 8 - 12 mmol / L / day  Plan:  --Will decrease 3% NaCl to 10 cc/hr --Fluid restriction: 1.2L --Sodium checks q4h --Continue to monitor plan per nephrology  Pearla Dubonnet  10/11/2021,8:55 AM

## 2021-10-11 NOTE — Consult Note (Signed)
[place] for 3% NaCl monitoring  Indication: Acute on chronic hyponatremia  Patient Measurements: Height: '4\' 8"'$  (142.2 cm) Weight: 43.7 kg (96 lb 5.5 oz) IBW/kg (Calculated) : 36.3  Intake/Output from previous day: 07/07 0701 - 07/08 0700 In: 639.9 [I.V.:639.9] Out: 1550 [Urine:1550] Intake/Output from this shift: Total I/O In: 73 [I.V.:73] Out: 200 [Urine:200]  Labs: Recent Labs    10/30/2021 0854 10/10/21 0507 10/11/21 0722  WBC 14.1* 15.2* 14.0*  HGB 9.7* 8.9* 9.8*  HCT 29.0* 26.8* 30.2*  PLT 512* 511* 520*  CREATININE 0.42* <0.30* 0.31*  MG 1.7  --   --   ALBUMIN 2.6*  --   --   PROT 6.6  --   --   AST 33  --   --   ALT 31  --   --   ALKPHOS 109  --   --   BILITOT 0.6  --   --     Estimated Creatinine Clearance: 41.2 mL/min (A) (by C-G formula based on SCr of 0.31 mg/dL (L)).  Medical History: Past Medical History:  Diagnosis Date   Asthma    COPD (chronic obstructive pulmonary disease) (HCC)    Dysrhythmia    GERD (gastroesophageal reflux disease)    History of kidney stones    Hypertension    Osteoarthritis    TIA (transient ischemic attack) 2011   No Deficits   Vertigo    Infusions:   sodium chloride     sodium chloride (hypertonic) 10 mL/hr at 10/11/21 2458   Assessment: Pharmacy has been consulted to assist and monitor hypertonic saline infusion in 70yo patient who was sent to the ER from the cancer center for evaluation of loss of vision, hearing, taste, and difficulty swallowing. Patient has a history of hyponatremia and hypokalemia but lab values were worse today during follow-up visit. Potassium level of 2.7, Magnesium 1.6 and sodium of 117. Thought to initially be d/t excessive fluid intake vs. SIADH, although she states she has cut back on her fluid intake as recommended. Primary team discussed with nephrology, who recommended starting patient on 3% saline.  Date/Time Na Rate 7/6 0854 117      N/A,  admission value 7/6 1159 N/A 3% NaCl started at 30 cc/hr 7/6 1301 124 3% NaCl at 30 cc/hr 7/6 1400 N/A 3% NaCl discontinued 7/6 1505 122      N/A 7/6 1600 N/A 0.9% NaCl started at 50 cc/hr 7/6 2034 120      0.9% NaCl at 50 cc/hr 7/7 0116 121      0.9% NaCl at 50 cc/hr 7/7 0507 122      0.9% NaCl at 50 cc/hr 7/7 0903 120 0.9% NaCl at 50 cc/hr 7/7 1241 N/A 3% NaCl started at 20 cc/hr 7/7 1330 120 3% NaCl at 20 cc/hr 7/7 2200          121     3% NaCl at 20 cc/hr 7/8 0202          122     3% NaCl at 20 cc/hr 7/8 0722   125 3% NaCl at 20 cc/hr 7/8 1329  125 3% NaCl at 10 cc/hr  Goal of Therapy:  Contact provider if Na increases > 4 mmol/L over 2 hours or > 6 mmol/L over 4 hours Increase Na by 8 - 12 mmol / L / day  Plan:  --Continue 3% NaCl at 10 cc/hr --Fluid restriction: 1.2L --Sodium checks q4h --Continue to  monitor plan per nephrology  Pearla Dubonnet 10/11/2021,1:55 PM

## 2021-10-11 NOTE — Progress Notes (Signed)
[facility]  ROUNDING NOTE   Subjective:   Vickie Caldwell is a 71 y.o. female with past medical history of COPD, GERD, hypertension, chronic hyponatremia and nasopharyngeal cancer. She presents to the ED at the request of the cancer center for abnormal labs. She has been admitted for Hypokalemia [E87.6] Hyponatremia [E87.1]  Patient is known to our practice and is followed by Dr Candiss Norse outpatient. She was last seen in our office on 09/18/21 for continued hyponatremia follow up.  Patient was seen today in ICU. Patient resting comfortably Patient offers no new specific physical complaints   Objective:  Vital signs in last 24 hours:  Temp:  [98 F (36.7 C)-98.1 F (36.7 C)] 98 F (36.7 C) (07/08 0400) Pulse Rate:  [71-118] 88 (07/08 0616) Resp:  [13-27] 20 (07/08 0616) BP: (98-133)/(54-87) 106/58 (07/08 0616) SpO2:  [95 %-100 %] 97 % (07/08 0616)  Weight change:  Filed Weights   10/04/2021 1004 10/17/2021 1242  Weight: 42.2 kg 43.7 kg    Intake/Output: I/O last 3 completed shifts: In: 1653.1 [I.V.:1514.1; IV Piggyback:138.9] Out: 1925 [Urine:1925]   Intake/Output this shift:  Total I/O In: 181.3 [I.V.:181.3] Out: 800 [Urine:800]  Physical Exam: General: NAD  Head: Normocephalic, atraumatic. Dry oral mucosal membranes  Eyes: Anicteric  Lungs:  Clear to auscultation, normal effort, room air  Heart: Regular rate and rhythm  Abdomen:  Soft, nontender  Extremities:  No peripheral edema.  Neurologic: Nonfocal, moving all four extremities  Skin: No lesions  Access: None    Basic Metabolic Panel: Recent Labs  Lab 10/06/21 1338 10/08/2021 0854 10/24/2021 1301 10/10/21 0507 10/10/21 0903 10/10/21 1330 10/10/21 2200 10/11/21 0202  NA 120* 117*   < > 122* 120* 120* 121* 122*  K 3.2* 2.7*  --  3.8  --   --   --   --   CL 80* 76*  --  87*  --   --   --   --   CO2 27 28  --  27  --   --   --   --   GLUCOSE 126* 158*  --  121*  --   --   --   --   BUN 9 10   --  <5*  --   --   --   --   CREATININE 0.36* 0.42*  --  <0.30*  --   --   --   --   CALCIUM 8.6* 8.4*  --  8.0*  --   --   --   --   MG 1.7 1.7  --   --   --   --   --   --    < > = values in this interval not displayed.    Liver Function Tests: Recent Labs  Lab 10/06/21 1338 10/08/2021 0854  AST 25 33  ALT 26 31  ALKPHOS 94 109  BILITOT 0.7 0.6  PROT 6.4* 6.6  ALBUMIN 2.6* 2.6*   No results for input(s): "LIPASE", "AMYLASE" in the last 168 hours. No results for input(s): "AMMONIA" in the last 168 hours.  CBC: Recent Labs  Lab 10/06/21 1338 10/15/2021 0854 10/10/21 0507  WBC 12.1* 14.1* 15.2*  NEUTROABS 10.2* 12.4*  --   HGB 8.8* 9.7* 8.9*  HCT 26.3* 29.0* 26.8*  MCV 83.8 82.6 82.5  PLT 446* 512* 511*    Cardiac Enzymes: No results for input(s): "CKTOTAL", "CKMB", "CKMBINDEX", "TROPONINI" in the last 168 hours.  BNP: Invalid input(s): "  POCBNP"  CBG: No results for input(s): "GLUCAP" in the last 168 hours.  Microbiology: Results for orders placed or performed during the hospital encounter of 10/22/2021  MRSA Next Gen by PCR, Nasal     Status: None   Collection Time: 10/15/2021 12:43 PM   Specimen: Nasal Mucosa; Nasal Swab  Result Value Ref Range Status   MRSA by PCR Next Gen NOT DETECTED NOT DETECTED Final    Comment: (NOTE) The GeneXpert MRSA Assay (FDA approved for NASAL specimens only), is one component of a comprehensive MRSA colonization surveillance program. It is not intended to diagnose MRSA infection nor to guide or monitor treatment for MRSA infections. Test performance is not FDA approved in patients less than 76 years old. Performed at [facility], [address] [place], [place] [address]     Coagulation Studies: No results for input(s): "LABPROT", "INR" in the last 72 hours.  Urinalysis: Recent Labs    10/23/2021 1249  COLORURINE STRAW*  LABSPEC 1.003*  PHURINE 9.0*  GLUCOSEU NEGATIVE  HGBUR NEGATIVE  BILIRUBINUR NEGATIVE   KETONESUR 5*  PROTEINUR NEGATIVE  NITRITE NEGATIVE  LEUKOCYTESUR NEGATIVE      Imaging: MR BRAIN W WO CONTRAST  Result Date: 10/11/2021 CLINICAL DATA:  Chronic headache EXAM: MRI HEAD WITHOUT AND WITH CONTRAST TECHNIQUE: Multiplanar, multiecho pulse sequences of the brain and surrounding structures were obtained without and with intravenous contrast. CONTRAST:  41m GADAVIST GADOBUTROL 1 MMOL/ML IV SOLN COMPARISON:  Head CT 08/07/2021 Head CT 06/10/2021 FINDINGS: Brain: No acute infarct, mass effect or extra-axial collection. No acute or chronic hemorrhage. There is multifocal hyperintense T2-weighted signal within the white matter. Generalized volume loss. The midline structures are normal. Vascular: Major flow voids are preserved. Skull and upper cervical spine: Normal calvarium and skull base. Visualized upper cervical spine and soft tissues are normal. Sinuses/Orbits:Bilateral mastoid effusions. Moderate mucosal thickening of the sphenoid sinuses. Normal orbits. There is abnormal contrast enhancement of the nasopharynx and prevertebral soft tissues. Diffusely abnormal bone marrow signal within the clivus, which is likely invaded by adjacent tumor. Abnormal contrast enhancement extends bilaterally into the parapharyngeal spaces. IMPRESSION: 1. No acute intracranial abnormality. 2. No nasopharyngeal carcinoma with large area of abnormal contrast enhancement extending into the prevertebral and parapharyngeal soft tissues as well as invading the clivus. 3. Bilateral mastoid effusions and moderate mucosal thickening of the sphenoid sinuses. Electronically Signed   By: KUlyses JarredM.D.   On: 10/11/2021 02:05   DG Chest Portable 1 View  Result Date: 10/17/2021 CLINICAL DATA:  Hyponatremia. History of metastatic head and neck cancer. EXAM: PORTABLE CHEST 1 VIEW COMPARISON:  PET-CT dated Aug 07, 2021. FINDINGS: New right chest wall port catheter with tip in the proximal right atrium. The heart size and  mediastinal contours are within normal limits. Both lungs are clear. The visualized skeletal structures are unremarkable. IMPRESSION: No active disease. Electronically Signed   By: WTitus DubinM.D.   On: 10/25/2021 10:30     Medications:    sodium chloride     sodium chloride (hypertonic) 20 mL/hr at 10/11/21 0500    aspirin  81 mg Oral Daily   Chlorhexidine Gluconate Cloth  6 each Topical Daily   enoxaparin (LOVENOX) injection  30 mg Subcutaneous Q24H   fluticasone  1 spray Each Nare Daily   lidocaine-prilocaine  1 Application Topical Once   metoprolol tartrate  25 mg Oral BID   montelukast  10 mg Oral Daily   nicotine  7 mg Transdermal  Daily   pantoprazole (PROTONIX) IV  40 mg Intravenous Daily   potassium chloride  40 mEq Oral BID   sodium chloride flush  3 mL Intravenous Q12H   umeclidinium bromide  1 puff Inhalation Daily   sodium chloride, acetaminophen, albuterol, diazepam, hydrALAZINE, lactulose, morphine CONCENTRATE, ondansetron **OR** ondansetron (ZOFRAN) IV, sodium chloride flush  Assessment/ Plan:  Ms. Vickie Caldwell is a 72 y.o.  female  with past medical history of COPD, GERD, hypertension, chronic hyponatremia and nasopharyngeal cancer. She presents to the ED at the request of the cancer center for abnormal labs. 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  Patient hypertonic saline was stopped earlier secondary to overcorrection Patient was later placed back on 3% hypertonic solution at a reduced rate,  91m/hr.  We will reduce patient's 3% saline rate to 10 mL/h Will continue to monitor sodium every 4 hours. Goal sodium increase no more than 8 mmol/L in 24 hours.   Lab Results  Component Value Date   CREATININE <0.30 (L) 10/10/2021   CREATININE 0.42 (L) 10/31/2021   CREATININE 0.36 (L) 10/06/2021    Intake/Output Summary (Last 24 hours) at 10/11/2021 0630 Last data filed at 10/11/2021 0500 Gross per 24 hour  Intake 639.94 ml  Output 1550 ml  Net -910.06 ml   2. Hypokalemia, potassium 2.7 on admission. Potassium corrected to 3.8 with IV supplementation.  3. Hypertension, essential. Home regimen includes amlodipine and metoprolol. Currently receiving Metoprolol.   Blood pressure 118/62  4.Anemia of chronic disease Hemoglobin is stable   Addendum Patient's sodium was 117 on October 09, 2021 Patient's sodium has now improved to 127 On October 11, 2021 We will discontinue patient's 3% saline We will follow   LOS: 2 Laneshia Pina [facility]7/8/20236:30 AM

## 2021-10-11 NOTE — Progress Notes (Signed)
Notified Judd Gaudier, MD that patient is unable to tolerate PO potassium that is scheduled.  Acknowledged and replied that patient has had a normal potassium for two days it is not justifiable to order IV potassium so we will see what it looks like in the morning. MD did offer mylanta to prevent the burning from taking the oral potassium but not Iv as a substitute in this particular situation. Will continue to monitor.

## 2021-10-11 NOTE — Progress Notes (Signed)
PROGRESS NOTE    Vickie Caldwell  HKV:[id number] DOB: Mar 17, 1951  DOA: 11/02/2021 Date of Service: 10/11/21 PCP: Theotis Burrow, MD     Brief Narrative / Hospital Course:  Vickie Caldwell is a 71 y.o. 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 Patient reports being unable to take her medications due to nausea for several days and complains of diarrhea as well. She states that she has a history of hyponatremia and hypokalemia but her numbers were worse today during her follow-up and so she was sent to the ER for further evaluation. 07/06: Sodium 117, K 2.7, Lg 1.6, Started on 3% saline per nephrology recs.  07/07: Nephrology following.  Was on 3% saline, on NS without much improvement, back on 3%.  Oncology including palliative care NP are following.   Family is requesting update regarding oncologic status/prognosis, I deferred detailed discussion to oncology team.  Per palliative notes, family to speak further regarding possible PEG placement for nutrition. MRI obtained to eval possible tumor extension vs CVA as cause for hearing loss -no acute intracranial abnormality, nasopharyngeal carcinoma with large area of abnormal contrast-enhancement extending into prevertebral and parapharyngeal soft tissues and clivus, bilateral mastoid effusions and moderate thickening sphenoid sinuses. 07/08: sodium slowly improving, now 125. WBC trending down to 14 (from 15). SPOke w/ patient I had to write down a lot to communicate w/ her, very HoH. She's ok w/ a feeding tube if needed.   Consultants:  Nephrology Oncology  Procedures: none    Subjective: She is tired today, mouth/throat hurts. SHe is frustrated she is unable to eat d/t  the pain/dryness and keeps choking.      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 Oncology team following while inpatient, including oncology palliative care NP Pt reports difficulty swallowing -->  changed pain meds and GERD tx to IV added IV hydralazine prn HTN SLP eval would need to consider feasibility of tube feeds if she is not able to take p.o., appreciate oncology input regarding prognosis to aid in decision making with his measure if needed - based on my discussion w/ patient today she is amenable to feeding tube   Hyponatremia Thought to be due to excessive fluid intake versus SIADH Patient states that she has cut back on her fluid intake as recommended She has worsening hyponatremia, and her sodium level is down to 117 on admission 07/06, associated with weakness and some confusion Admitting team d/w with nephrology who recommended to start patient on 3% saline 07/06, this was stopped due to rapid correction, placed on NS without sustainable recovery, back on 3% at reduced rate and continue to monitor every 4 hours Nephrology consulted and following closely.  Hypomagnesemia Most likely related to hypokalemia Supplement magnesium Follow levels  Hypokalemia Noted to have severe hypokalemia Supplement potassium --> improved to 3.8 Continue serial BMP Check magnesium levels  Current smoker Smoking cessation discussed with patient in detail nicotine transdermal patch 7 mg daily  Benign essential hypertension  Hold home amlodipine Continue metoprolol but at one half has scheduled dose, 50 mg daily, may not be able to take this po and if  so will cancel this  Hydralazine prn HTN    DVT prophylaxis: lovenox  Code Status: FULL Family Communication: left voicemail for brother, Antony Haste, .10/11/21  5:23 PM  Disposition Plan / TOC needs: remains inpatient appropriate, anticipate d/c to home w/ HH vs SNF, will ask PT/OT to assess once clinically improved  Barriers to discharge / significant pending items: hyponatremia requiring close monitoring and IV medications, anticipate discharge once sodium is corrected and stays stable, appreciate nephrology input going forward re: sodium, appreciate oncology and palliative input re: [place]. May need to place PEG here.              Objective: Vitals:   10/11/21 1000 10/11/21 1100 10/11/21 1142 10/11/21 1200  BP:   (!) 142/76 (!) 109/57  Pulse: 93 (!) 106  97  Resp: (!) 21 20  (!) 21  Temp:      TempSrc:      SpO2: 96% 98%  97%  Weight:      Height:        Intake/Output Summary (Last 24 hours) at 10/11/2021 1722 Last data filed at 10/11/2021 0940 Gross per 24 hour  Intake 377.55 ml  Output 1750 ml  Net -1372.45 ml    Filed Weights   10/08/2021 1004 10/23/2021 1242  Weight: 42.2 kg 43.7 kg    Examination:  Constitutional:  VS as above General Appearance: thin, NAD Neck: No masses, trachea midline Respiratory: Normal respiratory effort Breath sounds normal, no wheeze/rhonchi/rales Cardiovascular: S1/S2 normal, no murmur/rub/gallop auscultated No lower extremity edema Gastrointestinal: Nontender, no masses Musculoskeletal:  No clubbing/cyanosis of digits Neurological: No cranial nerve deficit on limited exam Motor and sensation intact and symmetric Psychiatric: Normal judgment/insight Normal mood and affect       Scheduled Medications:   aspirin  81 mg Oral Daily   Chlorhexidine Gluconate Cloth  6 each Topical Daily   enoxaparin (LOVENOX) injection  30 mg Subcutaneous Q24H   fluticasone  1 spray Each Nare Daily   lidocaine-prilocaine  1 Application  Topical Once   metoprolol tartrate  12.5 mg Oral BID   montelukast  10 mg Oral Daily   nicotine  7 mg Transdermal Daily   pantoprazole (PROTONIX) IV  40 mg Intravenous Daily   potassium chloride  40 mEq Oral BID   potassium chloride  40 mEq Oral Once   sodium chloride flush  3 mL Intravenous Q12H   umeclidinium bromide  1 puff Inhalation Daily    Continuous Infusions:  sodium chloride     sodium chloride (hypertonic) 10 mL/hr at 10/11/21 0851    PRN Medications:  sodium chloride, acetaminophen, albuterol, diazepam, hydrALAZINE, lactulose, morphine CONCENTRATE, ondansetron **OR** ondansetron (ZOFRAN) IV, sodium chloride flush  Antimicrobials:  Anti-infectives (From admission, onward)    None       Data Reviewed: I have personally reviewed following labs and imaging studies  CBC: Recent Labs  Lab 10/06/21 1338 10/22/2021 0854 10/10/21 0507 10/11/21 0722  WBC 12.1* 14.1* 15.2* 14.0*  NEUTROABS 10.2* 12.4*  --   --   HGB 8.8* 9.7* 8.9* 9.8*  HCT 26.3* 29.0* 26.8* 30.2*  MCV 83.8 82.6 82.5 84.1  PLT 446* 512* 511* 520*    Basic Metabolic Panel: Recent Labs  Lab 10/06/21 1338 11/02/2021 0854 10/25/2021 1301 10/10/21 0507 10/10/21 0350 10/10/21 2200 10/11/21 0202 10/11/21 0938 10/11/21 0915  10/11/21 1329  NA 120* 117*   < > 122*   < > 121* 122* 125* 125* 125*  K 3.2* 2.7*  --  3.8  --   --   --  3.6  --   --   CL 80* 76*  --  87*  --   --   --  90*  --   --   CO2 27 28  --  27  --   --   --  25  --   --   GLUCOSE 126* 158*  --  121*  --   --   --  111*  --   --   BUN 9 10  --  <5*  --   --   --  <5*  --   --   CREATININE 0.36* 0.42*  --  <0.30*  --   --   --  0.31*  --   --   CALCIUM 8.6* 8.4*  --  8.0*  --   --   --  8.9  --   --   MG 1.7 1.7  --   --   --   --   --   --   --   --    < > = values in this interval not displayed.    GFR: Estimated Creatinine Clearance: 41.2 mL/min (A) (by C-G formula based on SCr of 0.31 mg/dL (L)). Liver Function  Tests: Recent Labs  Lab 10/06/21 1338 10/25/2021 0854  AST 25 33  ALT 26 31  ALKPHOS 94 109  BILITOT 0.7 0.6  PROT 6.4* 6.6  ALBUMIN 2.6* 2.6*    No results for input(s): "LIPASE", "AMYLASE" in the last 168 hours. No results for input(s): "AMMONIA" in the last 168 hours. Coagulation Profile: No results for input(s): "INR", "PROTIME" in the last 168 hours. Cardiac Enzymes: No results for input(s): "CKTOTAL", "CKMB", "CKMBINDEX", "TROPONINI" in the last 168 hours. BNP (last 3 results) No results for input(s): "PROBNP" in the last 8760 hours. HbA1C: No results for input(s): "HGBA1C" in the last 72 hours. CBG: No results for input(s): "GLUCAP" in the last 168 hours. Lipid Profile: No results for input(s): "CHOL", "HDL", "LDLCALC", "TRIG", "CHOLHDL", "LDLDIRECT" in the last 72 hours. Thyroid Function Tests: No results for input(s): "TSH", "T4TOTAL", "FREET4", "T3FREE", "THYROIDAB" in the last 72 hours. Anemia Panel: No results for input(s): "VITAMINB12", "FOLATE", "FERRITIN", "TIBC", "IRON", "RETICCTPCT" in the last 72 hours. Urine analysis:    Component Value Date/Time   COLORURINE STRAW (A) 10/08/2021 1249   APPEARANCEUR CLEAR (A) 10/14/2021 1249   LABSPEC 1.003 (L) 10/30/2021 1249   PHURINE 9.0 (H) 10/27/2021 1249   GLUCOSEU NEGATIVE 10/25/2021 1249   HGBUR NEGATIVE 10/27/2021 1249   BILIRUBINUR NEGATIVE 10/28/2021 1249   KETONESUR 5 (A) 10/10/2021 1249   PROTEINUR NEGATIVE 10/19/2021 1249   NITRITE NEGATIVE 10/17/2021 1249   LEUKOCYTESUR NEGATIVE 10/08/2021 1249   Sepsis Labs: '@LABRCNTIP'$ (procalcitonin:4,lacticidven:4)  Recent Results (from the past 240 hour(s))  MRSA Next Gen by PCR, Nasal     Status: None   Collection Time: 10/22/2021 12:43 PM   Specimen: Nasal Mucosa; Nasal Swab  Result Value Ref Range Status   MRSA by PCR Next Gen NOT DETECTED NOT DETECTED Final    Comment: (NOTE) The GeneXpert MRSA Assay (FDA approved for NASAL specimens only), is one component  of a comprehensive MRSA colonization surveillance program. It is not intended to diagnose MRSA infection nor to guide or monitor treatment for  MRSA infections. Test performance is not FDA approved in patients less than 46 years old. Performed at [facility], [address], [place], [place] [address]          Radiology Studies last 96 hours: MR BRAIN W WO CONTRAST  Result Date: 10/11/2021 CLINICAL DATA:  Chronic headache EXAM: MRI HEAD WITHOUT AND WITH CONTRAST TECHNIQUE: Multiplanar, multiecho pulse sequences of the brain and surrounding structures were obtained without and with intravenous contrast. CONTRAST:  61m GADAVIST GADOBUTROL 1 MMOL/ML IV SOLN COMPARISON:  Head CT 08/07/2021 Head CT 06/10/2021 FINDINGS: Brain: No acute infarct, mass effect or extra-axial collection. No acute or chronic hemorrhage. There is multifocal hyperintense T2-weighted signal within the white matter. Generalized volume loss. The midline structures are normal. Vascular: Major flow voids are preserved. Skull and upper cervical spine: Normal calvarium and skull base. Visualized upper cervical spine and soft tissues are normal. Sinuses/Orbits:Bilateral mastoid effusions. Moderate mucosal thickening of the sphenoid sinuses. Normal orbits. There is abnormal contrast enhancement of the nasopharynx and prevertebral soft tissues. Diffusely abnormal bone marrow signal within the clivus, which is likely invaded by adjacent tumor. Abnormal contrast enhancement extends bilaterally into the parapharyngeal spaces. IMPRESSION: 1. No acute intracranial abnormality. 2. No nasopharyngeal carcinoma with large area of abnormal contrast enhancement extending into the prevertebral and parapharyngeal soft tissues as well as invading the clivus. 3. Bilateral mastoid effusions and moderate mucosal thickening of the sphenoid sinuses. Electronically Signed   By: KUlyses JarredM.D.   On: 10/11/2021 02:05   DG Chest Portable 1  View  Result Date: 10/28/2021 CLINICAL DATA:  Hyponatremia. History of metastatic head and neck cancer. EXAM: PORTABLE CHEST 1 VIEW COMPARISON:  PET-CT dated Aug 07, 2021. FINDINGS: New right chest wall port catheter with tip in the proximal right atrium. The heart size and mediastinal contours are within normal limits. Both lungs are clear. The visualized skeletal structures are unremarkable. IMPRESSION: No active disease. Electronically Signed   By: WTitus DubinM.D.   On: 10/08/2021 10:30            LOS: 2 days    Time spent: 50 min    NEmeterio Reeve DO [facility] 10/11/2021, 5:22 PM   Staff may message me via secure chat [place] but this may not receive immediate response,  please page for urgent matters!  If 7PM-7AM, please contact night-coverage [email or web address]  Dictation software was used to generate the above note. Typos may occur and escape review, as with typed/written notes. Please contact Dr ASheppard Coildirectly for clarity if needed.

## 2021-10-11 NOTE — Consult Note (Signed)
[place] for 3% NaCl monitoring  Indication: Acute on chronic hyponatremia  Patient Measurements: Height: '4\' 8"'$  (142.2 cm) Weight: 43.7 kg (96 lb 5.5 oz) IBW/kg (Calculated) : 36.3  Intake/Output from previous day: 07/07 0701 - 07/08 0700 In: 557.8 [I.V.:557.8] Out: 750 [Urine:750] Intake/Output from this shift: Total I/O In: 99.1 [I.V.:99.1] Out: -   Labs: Recent Labs    10/15/2021 0854 10/10/21 0507  WBC 14.1* 15.2*  HGB 9.7* 8.9*  HCT 29.0* 26.8*  PLT 512* 511*  CREATININE 0.42* <0.30*  MG 1.7  --   ALBUMIN 2.6*  --   PROT 6.6  --   AST 33  --   ALT 31  --   ALKPHOS 109  --   BILITOT 0.6  --     CrCl cannot be calculated (This lab value cannot be used to calculate CrCl because it is not a number: <0.30).  Medical History: Past Medical History:  Diagnosis Date   Asthma    COPD (chronic obstructive pulmonary disease) (HCC)    Dysrhythmia    GERD (gastroesophageal reflux disease)    History of kidney stones    Hypertension    Osteoarthritis    TIA (transient ischemic attack) 2011   No Deficits   Vertigo    Infusions:   sodium chloride     sodium chloride (hypertonic) 20 mL/hr at 10/11/21 0000   Assessment: Pharmacy has been consulted to assist and monitor hypertonic saline infusion in 70yo patient who was sent to the ER from the cancer center for evaluation of loss of vision, hearing, taste, and difficulty swallowing. Patient has a history of hyponatremia and hypokalemia but lab values were worse today during follow-up visit. Potassium level of 2.7, Magnesium 1.6 and sodium of 117. Thought to initially be d/t excessive fluid intake vs. SIADH, although she states she has cut back on her fluid intake as recommended. Primary team discussed with nephrology, who recommended starting patient on 3% saline.  Date/Time Na Rate 7/6 0854 117      N/A, admission value 7/6 1159 N/A 3% NaCl started at 30 cc/hr 7/6 1301 124 3%  NaCl at 30 cc/hr 7/6 1400 N/A 3% NaCl discontinued 7/6 1505 122      N/A 7/6 1600 N/A 0.9% NaCl started at 50 cc/hr 7/6 2034 120      0.9% NaCl at 50 cc/hr 7/7 0116 121      0.9% NaCl at 50 cc/hr 7/7 0507 122      0.9% NaCl at 50 cc/hr 7/7 0903 120 0.9% NaCl at 50 cc/hr 7/7 1241 N/A 3% NaCl started at 20 cc/hr 7/7 1330 120 3% NaCl at 20 cc/hr 7/7 2200          121     3% NaCl at 20 cc/hr 7/8 0202          122     3% NaCl at 20 cc/hr  Goal of Therapy:  Contact provider if Na increases > 4 mmol/L over 2 hours or > 6 mmol/L over 4 hours Increase Na by 8 - 12 mmol / L / day  Plan:  --Continue 3% NaCl at 20 cc/hr --Fluid restriction: 1.2L --Sodium checks q4h --Continue to monitor plan per nephrology  Calel Pisarski D 10/11/2021,2:29 AM

## 2021-10-12 DIAGNOSIS — E871 Hypo-osmolality and hyponatremia: Secondary | ICD-10-CM | POA: Diagnosis not present

## 2021-10-12 LAB — BASIC METABOLIC PANEL
Anion gap: 9 (ref 5–15)
BUN: <5 mg/dL — ABNORMAL LOW (ref 8–23)
CO2: 25 mmol/L (ref 22–32)
Calcium: 8.4 mg/dL — ABNORMAL LOW (ref 8.9–10.3)
Chloride: 92 mmol/L — ABNORMAL LOW (ref 98–111)
Creatinine, Ser: 0.32 mg/dL — ABNORMAL LOW (ref 0.44–1.00)
GFR, Estimated: >60 mL/min (ref >60)
Glucose, Bld: 113 mg/dL — ABNORMAL HIGH (ref 70–99)
Potassium: 3.1 mmol/L — ABNORMAL LOW (ref 3.5–5.1)
Sodium: 126 mmol/L — ABNORMAL LOW (ref 135–145)

## 2021-10-12 LAB — SODIUM
Sodium: 124 mmol/L — ABNORMAL LOW (ref 135–145)
Sodium: 128 mmol/L — ABNORMAL LOW (ref 135–145)
Sodium: 129 mmol/L — ABNORMAL LOW (ref 135–145)
Sodium: 128 mmol/L — ABNORMAL LOW (ref 135–145)

## 2021-10-12 LAB — CBC
HCT: 26.4 % — ABNORMAL LOW (ref 36.0–46.0)
Hemoglobin: 8.6 g/dL — ABNORMAL LOW (ref 12.0–15.0)
MCH: 27 pg (ref 26.0–34.0)
MCHC: 32.6 g/dL (ref 30.0–36.0)
MCV: 83 fL (ref 80.0–100.0)
Platelets: 500 10*3/uL — ABNORMAL HIGH (ref 150–400)
RBC: 3.18 MIL/uL — ABNORMAL LOW (ref 3.87–5.11)
RDW: 13.1 % (ref 11.5–15.5)
WBC: 13 10*3/uL — ABNORMAL HIGH (ref 4.0–10.5)
nRBC: 0 % (ref 0.0–0.2)

## 2021-10-12 MED ORDER — SODIUM CHLORIDE 0.9 % IV SOLN
INTRAVENOUS | Status: DC
  Administered 2021-10-13: 100 mL/h via INTRAVENOUS

## 2021-10-12 MED ORDER — POTASSIUM CHLORIDE 10 MEQ/100ML IV SOLN
10.0000 meq | INTRAVENOUS | Status: AC
  Administered 2021-10-12 (×2): 10 meq via INTRAVENOUS
  Filled 2021-10-12 (×2): qty 100

## 2021-10-12 MED ORDER — POTASSIUM CHLORIDE 10 MEQ/100ML IV SOLN
10.0000 meq | INTRAVENOUS | Status: AC
  Administered 2021-10-12 (×4): 10 meq via INTRAVENOUS
  Filled 2021-10-12 (×4): qty 100

## 2021-10-12 MED ORDER — POTASSIUM CHLORIDE 10 MEQ/100ML IV SOLN
10.0000 meq | INTRAVENOUS | Status: DC
  Filled 2021-10-12 (×4): qty 100

## 2021-10-12 MED ORDER — ORAL CARE MOUTH RINSE
15.0000 mL | OROMUCOSAL | Status: DC | PRN
Start: 2021-10-12 — End: 2021-10-18

## 2021-10-12 MED ORDER — ORAL CARE MOUTH RINSE
15.0000 mL | OROMUCOSAL | Status: DC
Start: 2021-10-12 — End: 2021-10-16
  Administered 2021-10-12 – 2021-10-16 (×19): 15 mL via OROMUCOSAL

## 2021-10-12 NOTE — Consult Note (Signed)
[place] for 3% NaCl monitoring  Indication: Acute on chronic hyponatremia  Patient Measurements: Height: '4\' 8"'$  (142.2 cm) Weight: 43.7 kg (96 lb 5.5 oz) IBW/kg (Calculated) : 36.3  Intake/Output from previous day: 07/08 0701 - 07/09 0700 In: 202.4 [I.V.:202.4] Out: 400 [Urine:400] Intake/Output from this shift: Total I/O In: 28.6 [I.V.:28.6] Out: 200 [Urine:200]  Labs: Recent Labs    10/13/2021 0854 10/10/21 0507 10/11/21 0722  WBC 14.1* 15.2* 14.0*  HGB 9.7* 8.9* 9.8*  HCT 29.0* 26.8* 30.2*  PLT 512* 511* 520*  CREATININE 0.42* <0.30* 0.31*  MG 1.7  --   --   ALBUMIN 2.6*  --   --   PROT 6.6  --   --   AST 33  --   --   ALT 31  --   --   ALKPHOS 109  --   --   BILITOT 0.6  --   --     Estimated Creatinine Clearance: 41.2 mL/min (A) (by C-G formula based on SCr of 0.31 mg/dL (L)).  Medical History: Past Medical History:  Diagnosis Date   Asthma    COPD (chronic obstructive pulmonary disease) (HCC)    Dysrhythmia    GERD (gastroesophageal reflux disease)    History of kidney stones    Hypertension    Osteoarthritis    TIA (transient ischemic attack) 2011   No Deficits   Vertigo    Infusions:   sodium chloride     Assessment: Pharmacy has been consulted to assist and monitor hypertonic saline infusion in 71yo patient who was sent to the ER from the cancer center for evaluation of loss of vision, hearing, taste, and difficulty swallowing. Patient has a history of hyponatremia and hypokalemia but lab values were worse today during follow-up visit. Potassium level of 2.7, Magnesium 1.6 and sodium of 117. Thought to initially be d/t excessive fluid intake vs. SIADH, although she states she has cut back on her fluid intake as recommended. Primary team discussed with nephrology, who recommended starting patient on 3% saline.  Date/Time Na Rate 7/6 0854 117      N/A, admission value 7/6 1159 N/A 3% NaCl started at 30  ml/hr 7/6 1301 124 3% NaCl at 30 ml/hr 7/6 1400 N/A 3% NaCl discontinued 7/6 1505 122      N/A 7/6 1600 N/A 0.9% NaCl started at 50 ml/hr 7/6 2034 120      0.9% NaCl at 50 ml/hr 7/7 0116 121      0.9% NaCl at 50 ml/hr 7/7 0507 122      0.9% NaCl at 50 ml/hr 7/7 0903 120 0.9% NaCl at 50 ml/hr 7/7 1241 N/A 3% NaCl started at 20 ml/hr 7/7 1330 120 3% NaCl at 20 ml/hr 7/7 2200          121     3% NaCl at 20 ml/hr 7/8 0202          122     3% NaCl at 20 ml/hr 7/8 0722   125 3% NaCl at 20 ml/hr 7/8 1329  125 3% NaCl at 10 ml/hr 7/8 1704           127     3% NaCl at 10 ml/hr 7/9 0053           124     3% NaCl at 10 ml/hr  Goal of Therapy:  Contact provider if Na increases > 4 mmol/L over 2 hours or >  6 mmol/L over 4 hours Increase Na by 8 - 12 mmol / L / day  Plan:  --Continue 3% NaCl at 10 cc/hr --Fluid restriction: 1.2L --Sodium checks q4h --Continue to monitor plan per nephrology  Mycah Mcdougall D 10/12/2021 1:25 AM

## 2021-10-12 NOTE — Progress Notes (Signed)
MD notified patients sodium level is down to 124. See new order for NS @ 100 ml/hr.

## 2021-10-12 NOTE — Consult Note (Signed)
[place] for 3% NaCl monitoring  Indication: Acute on chronic hyponatremia  Patient Measurements: Height: '4\' 8"'$  (142.2 cm) Weight: 43.7 kg (96 lb 5.5 oz) IBW/kg (Calculated) : 36.3  Intake/Output from previous day: 07/08 0701 - 07/09 0700 In: 646.8 [P.O.:120; I.V.:457.9; IV Piggyback:68.8] Out: 600 [Urine:600] Intake/Output from this shift: Total I/O In: 126.4 [I.V.:83.3; IV Piggyback:43] Out: 150 [Urine:150]  Labs: Recent Labs    10/10/21 0507 10/11/21 0722 10/12/21 0427  WBC 15.2* 14.0* 13.0*  HGB 8.9* 9.8* 8.6*  HCT 26.8* 30.2* 26.4*  PLT 511* 520* 500*  CREATININE <0.30* 0.31* 0.32*    Estimated Creatinine Clearance: 41.2 mL/min (A) (by C-G formula based on SCr of 0.32 mg/dL (L)).  Medical History: Past Medical History:  Diagnosis Date   Asthma    COPD (chronic obstructive pulmonary disease) (HCC)    Dysrhythmia    GERD (gastroesophageal reflux disease)    History of kidney stones    Hypertension    Osteoarthritis    TIA (transient ischemic attack) 2011   No Deficits   Vertigo    Infusions:   sodium chloride Stopped (10/12/21 0605)   sodium chloride 100 mL/hr at 10/12/21 0737   Assessment: Pharmacy has been consulted to assist and monitor hypertonic saline infusion in 71yo patient who was sent to the ER from the cancer center for evaluation of loss of vision, hearing, taste, and difficulty swallowing. Patient has a history of hyponatremia and hypokalemia but lab values were worse today during follow-up visit. Potassium level of 2.7, Magnesium 1.6 and sodium of 117. Thought to initially be d/t excessive fluid intake vs. SIADH, although she states she has cut back on her fluid intake as recommended. Primary team discussed with nephrology, who recommended starting patient on 3% saline.  Date/Time Na Rate 7/6 0854 117      N/A, admission value 7/6 1159 N/A 3% NaCl started at 30 ml/hr 7/6 1301 124 3% NaCl at 30  ml/hr 7/6 1400 N/A 3% NaCl discontinued 7/6 1505 122      N/A 7/6 1600 N/A 0.9% NaCl started at 50 ml/hr 7/6 2034 120      0.9% NaCl at 50 ml/hr 7/7 0116 121      0.9% NaCl at 50 ml/hr 7/7 0507 122      0.9% NaCl at 50 ml/hr 7/7 0903 120 0.9% NaCl at 50 ml/hr 7/7 1241 N/A 3% NaCl started at 20 ml/hr 7/7 1330 120 3% NaCl at 20 ml/hr 7/7 2200          121     3% NaCl at 20 ml/hr 7/8 0202          122     3% NaCl at 20 ml/hr 7/8 0722  125 3% NaCl at 20 ml/hr 7/8 1329 125 3% NaCl at 10 ml/hr 7/8 1704          127     3% NaCl at 10 ml/hr 7/9 0053          124     3% NaCl stopped 7/9 0427          126     0.9% NaCl at 100 ml/hr 7/9 0923  128 0.9% NaCl at 100 ml/hr   Goal of Therapy:  Contact provider if Na increases > 4 mmol/L over 2 hours or > 6 mmol/L over 4 hours Increase Na by 8 - 12 mmol / L / day  Plan:  --Continue NS'@100ml'$ /hr --Fluid  restriction: 1.2L --Sodium checks q4h --Continue to monitor plan per nephrology  Pearla Dubonnet 10/12/2021 10:36 AM

## 2021-10-12 NOTE — Progress Notes (Signed)
Judd Gaudier, MD notified of potassium 3.1, see new order received.

## 2021-10-12 NOTE — Progress Notes (Signed)
PROGRESS NOTE    Vickie Caldwell  OJJ:[id number] DOB: Aug 11, 1950  DOA: 10/24/2021 Date of Service: 10/12/21 PCP: Theotis Burrow, MD     Brief Narrative / Hospital Course:  Vickie Caldwell is a 71 y.o. 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 Patient reports being unable to take her medications due to nausea for several days and complains of diarrhea as well. She states that she has a history of hyponatremia and hypokalemia but her numbers were worse today during her follow-up and so she was sent to the ER for further evaluation. 07/06: Sodium 117, K 2.7, Lg 1.6, Started on 3% saline per nephrology recs.  07/07: Nephrology following.  Was on 3% saline, on NS without much improvement, back on 3%.  Oncology including palliative care NP are following.   Family is requesting update regarding oncologic status/prognosis, I deferred detailed discussion to oncology team.  Per palliative notes, family to speak further regarding possible PEG placement for nutrition. MRI obtained to eval possible tumor extension vs CVA as cause for hearing loss -no acute intracranial abnormality, nasopharyngeal carcinoma with large area of abnormal contrast-enhancement extending into prevertebral and parapharyngeal soft tissues and clivus, bilateral mastoid effusions and moderate thickening sphenoid sinuses. 07/08: sodium slowly improving, now 125. WBC trending down to 14 (from 15). SPOke w/ patient I had to write down a lot to communicate w/ her, very HoH. She's ok w/ a feeding tube if needed.  07/09: sodium improving slowly, nephrology following. I can communicate w/ the patient by writing information and questions, she has good understanding of her  situation.   Consultants:  Nephrology Oncology  Procedures: none    Subjective: but a bit better from yesterday, mouth/throat persistently hurts.     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 Oncology team following while inpatient, including oncology palliative care NP Pt reports difficulty swallowing -->  changed pain meds and GERD tx to IV added IV hydralazine prn HTN based on my discussion w/ patient she is amenable to and is in fact requesting a feeding tube will try to arrange this tomorrow 07/10 (Monday) w/ GI or IR   Hyponatremia Thought to be due to excessive fluid intake versus SIADH Patient states that she has cut back on her fluid intake as recommended Sodium level is down to 117 on admission 07/06, associated with weakness and some confusion Nephrology consulted and following closely. Sodium improving   Hypomagnesemia Most likely related to hypokalemia Supplement magnesium Follow levels  Hypokalemia Noted to have severe hypokalemia Supplement potassium --> improved to 3.8 Continue serial BMP Check magnesium levels  Current smoker Smoking cessation discussed with patient in detail nicotine transdermal patch 7 mg daily  Benign essential hypertension Hold home amlodipine Continue metoprolol but at one half has scheduled dose, 50 mg daily, may not be able to take this po and if so will cancel this  Hydralazine prn HTN    DVT prophylaxis: lovenox  Code Status: FULL Family Communication: left voicemail  for brother, Vickie Caldwell, .10/12/21  1:05 PM  Disposition Plan / TOC needs: remains inpatient appropriate, anticipate d/c  to home w/ HH vs SNF, will ask PT/OT to assess once clinically improved  Barriers to discharge / significant pending items: hyponatremia requiring close monitoring and IV medications, anticipate discharge once sodium is corrected and stays stable, appreciate nephrology input going forward re: sodium, appreciate oncology and palliative input re: GOC. Plan PEG placement              Objective: Vitals:   10/12/21 0700 10/12/21 0736 10/12/21 0800 10/12/21 0900  BP: 119/66  127/67 121/69  Pulse:  (!) 116 (!) 106 99  Resp: (!) 22 (!) 22 (!) 26 18  Temp:  97.7 F (36.5 C)    TempSrc:  Oral    SpO2:  98% 97% 97%  Weight:      Height:        Intake/Output Summary (Last 24 hours) at 10/12/2021 1305 Last data filed at 10/12/2021 1131 Gross per 24 hour  Intake 700.15 ml  Output 850 ml  Net -149.85 ml   Filed Weights   10/19/2021 1004 10/13/2021 1242  Weight: 42.2 kg 43.7 kg    Examination:  Constitutional:  VS as above General Appearance: thin, NAD Neck: No masses, trachea midline Respiratory: Normal respiratory effort Breath sounds normal, no wheeze/rhonchi/rales Cardiovascular: S1/S2 normal, no murmur/rub/gallop auscultated No lower extremity edema Gastrointestinal: Nontender, no masses Musculoskeletal:  No clubbing/cyanosis of digits Neurological: No cranial nerve deficit on limited exam Motor and sensation intact and symmetric Psychiatric: Normal judgment/insight Normal mood and affect       Scheduled Medications:   aspirin  81 mg Oral Daily   Chlorhexidine Gluconate Cloth  6 each Topical Daily   enoxaparin (LOVENOX) injection  30 mg Subcutaneous Q24H   fluticasone  1 spray Each Nare Daily   lidocaine-prilocaine  1 Application Topical Once   metoprolol tartrate  12.5 mg Oral BID   montelukast  10 mg Oral Daily   nicotine  7 mg Transdermal Daily   mouth rinse  15 mL Mouth Rinse 4 times per day   pantoprazole (PROTONIX) IV  40 mg Intravenous Daily   sodium  chloride flush  3 mL Intravenous Q12H   umeclidinium bromide  1 puff Inhalation Daily    Continuous Infusions:  sodium chloride Stopped (10/12/21 0605)   sodium chloride 100 mL/hr at 10/12/21 0737   potassium chloride      PRN Medications:  sodium chloride, acetaminophen, albuterol, diazepam, hydrALAZINE, lactulose, morphine CONCENTRATE, ondansetron **OR** ondansetron (ZOFRAN) IV, mouth rinse, sodium chloride flush  Antimicrobials:  Anti-infectives (From admission, onward)    None       Data Reviewed: I have personally reviewed following labs and imaging studies  CBC: Recent Labs  Lab 10/06/21 1338 10/15/2021 0854 10/10/21 0507 10/11/21 0722 10/12/21 0427  WBC 12.1* 14.1* 15.2* 14.0* 13.0*  NEUTROABS 10.2* 12.4*  --   --   --   HGB 8.8* 9.7* 8.9* 9.8* 8.6*  HCT 26.3* 29.0* 26.8* 30.2* 26.4*  MCV 83.8 82.6 82.5 84.1 83.0  PLT 446* 512* 511* 520* 629*   Basic Metabolic Panel: Recent Labs  Lab 10/06/21 1338 10/22/2021 0854 10/15/2021 1301 10/10/21 0507 10/10/21 0903 10/11/21 0722 10/11/21 0915 10/11/21 1704 10/11/21 2055 10/12/21 0053 10/12/21 0427 10/12/21 0923  NA 120* 117*   < > 122*   < > 125*   < > 127* 127* 124* 126* 128*  K 3.2* 2.7*  --  3.8  --  3.6  --   --   --   --  3.1*  --   CL 80* 76*  --  87*  --  90*  --   --   --   --  92*  --   CO2 27 28  --  27  --  25  --   --   --   --  25  --   GLUCOSE 126* 158*  --  121*  --  111*  --   --   --   --  113*  --   BUN 9 10  --  <5*  --  <5*  --   --   --   --  <5*  --   CREATININE 0.36* 0.42*  --  <0.30*  --  0.31*  --   --   --   --  0.32*  --   CALCIUM 8.6* 8.4*  --  8.0*  --  8.9  --   --   --   --  8.4*  --   MG 1.7 1.7  --   --   --   --   --   --   --   --   --   --    < > = values in this interval not displayed.   GFR: Estimated Creatinine Clearance: 41.2 mL/min (A) (by C-G formula based on SCr of 0.32 mg/dL (L)). Liver Function Tests: Recent Labs  Lab 10/06/21 1338 10/30/2021 0854  AST 25 33   ALT 26 31  ALKPHOS 94 109  BILITOT 0.7 0.6  PROT 6.4* 6.6  ALBUMIN 2.6* 2.6*   No results for input(s): "LIPASE", "AMYLASE" in the last 168 hours. No results for input(s): "AMMONIA" in the last 168 hours. Coagulation Profile: No results for input(s): "INR", "PROTIME" in the last 168 hours. Cardiac Enzymes: No results for input(s): "CKTOTAL", "CKMB", "CKMBINDEX", "TROPONINI" in the last 168 hours. BNP (last 3 results) No results for input(s): "PROBNP" in the last 8760 hours. HbA1C: No results for input(s): "HGBA1C" in the last 72 hours. CBG: No results for input(s): "GLUCAP" in the last 168 hours. Lipid Profile: No results for input(s): "CHOL", "HDL", "LDLCALC", "TRIG", "CHOLHDL", "LDLDIRECT" in the last 72 hours. Thyroid Function Tests: No results for input(s): "TSH", "T4TOTAL", "FREET4", "T3FREE", "THYROIDAB" in the last 72 hours. Anemia Panel: No results for input(s): "VITAMINB12", "FOLATE", "FERRITIN", "TIBC", "IRON", "RETICCTPCT" in the last 72 hours. Urine analysis:    Component Value Date/Time   COLORURINE STRAW (A) 10/22/2021 1249   APPEARANCEUR CLEAR (A) 10/22/2021 1249   LABSPEC 1.003 (L) 10/07/2021 1249   PHURINE 9.0 (H) 10/28/2021 1249   GLUCOSEU NEGATIVE 11/01/2021 1249   HGBUR NEGATIVE 10/21/2021 1249   BILIRUBINUR NEGATIVE 10/27/2021 1249   KETONESUR 5 (A) 10/30/2021 1249   PROTEINUR NEGATIVE 10/25/2021 1249   NITRITE NEGATIVE 10/15/2021 1249   LEUKOCYTESUR NEGATIVE 10/21/2021 1249   Sepsis Labs: '@LABRCNTIP'$ (procalcitonin:4,lacticidven:4)  Recent Results (from the past 240 hour(s))  MRSA Next Gen by PCR, Nasal     Status: None   Collection Time: 10/27/2021 12:43 PM   Specimen: Nasal Mucosa; Nasal Swab  Result Value Ref Range Status   MRSA by PCR Next Gen NOT DETECTED NOT DETECTED Final    Comment: (NOTE) The GeneXpert MRSA Assay (FDA approved for NASAL specimens only), is one component of a comprehensive MRSA colonization surveillance program. It is not  intended to diagnose MRSA infection nor to  guide or monitor treatment for MRSA infections. Test performance is not FDA approved in patients less than 63 years old. Performed at [facility], [address] [place], [place] [address]          Radiology Studies last 96 hours: MR BRAIN W WO CONTRAST  Result Date: 10/11/2021 CLINICAL DATA:  Chronic headache EXAM: MRI HEAD WITHOUT AND WITH CONTRAST TECHNIQUE: Multiplanar, multiecho pulse sequences of the brain and surrounding structures were obtained without and with intravenous contrast. CONTRAST:  48m GADAVIST GADOBUTROL 1 MMOL/ML IV SOLN COMPARISON:  Head CT 08/07/2021 Head CT 06/10/2021 FINDINGS: Brain: No acute infarct, mass effect or extra-axial collection. No acute or chronic hemorrhage. There is multifocal hyperintense T2-weighted signal within the white matter. Generalized volume loss. The midline structures are normal. Vascular: Major flow voids are preserved. Skull and upper cervical spine: Normal calvarium and skull base. Visualized upper cervical spine and soft tissues are normal. Sinuses/Orbits:Bilateral mastoid effusions. Moderate mucosal thickening of the sphenoid sinuses. Normal orbits. There is abnormal contrast enhancement of the nasopharynx and prevertebral soft tissues. Diffusely abnormal bone marrow signal within the clivus, which is likely invaded by adjacent tumor. Abnormal contrast enhancement extends bilaterally into the parapharyngeal spaces. IMPRESSION: 1. No acute intracranial abnormality. 2. No nasopharyngeal carcinoma with large area of abnormal contrast enhancement extending into the prevertebral and parapharyngeal soft tissues as well as invading the clivus. 3. Bilateral mastoid effusions and moderate mucosal thickening of the sphenoid sinuses. Electronically Signed   By: KUlyses JarredM.D.   On: 10/11/2021 02:05   DG Chest Portable 1 View  Result Date: 10/07/2021 CLINICAL DATA:  Hyponatremia. History of  metastatic head and neck cancer. EXAM: PORTABLE CHEST 1 VIEW COMPARISON:  PET-CT dated Aug 07, 2021. FINDINGS: New right chest wall port catheter with tip in the proximal right atrium. The heart size and mediastinal contours are within normal limits. Both lungs are clear. The visualized skeletal structures are unremarkable. IMPRESSION: No active disease. Electronically Signed   By: WTitus DubinM.D.   On: 10/21/2021 10:30            LOS: 3 days       NEmeterio Reeve DO [facility] 10/12/2021, 1:05 PM   Staff may message me via secure chat in [email or web address] but this may not receive immediate response,  please page for urgent matters!  If 7PM-7AM, please contact night-coverage [email or web address]  Dictation software was used to generate the above note. Typos may occur and escape review, as with typed/written notes. Please contact Dr ASheppard Coildirectly for clarity if needed.

## 2021-10-12 NOTE — Consult Note (Signed)
[place] for 3% NaCl monitoring  Indication: Acute on chronic hyponatremia  Patient Measurements: Height: '4\' 8"'$  (142.2 cm) Weight: 43.7 kg (96 lb 5.5 oz) IBW/kg (Calculated) : 36.3  Intake/Output from previous day: 07/08 0701 - 07/09 0700 In: 399.5 [P.O.:120; I.V.:279.5] Out: 600 [Urine:600] Intake/Output from this shift: Total I/O In: 225.6 [P.O.:120; I.V.:105.6] Out: 400 [Urine:400]  Labs: Recent Labs    10/12/2021 0854 10/10/21 0507 10/11/21 0722 10/12/21 0427  WBC 14.1* 15.2* 14.0* 13.0*  HGB 9.7* 8.9* 9.8* 8.6*  HCT 29.0* 26.8* 30.2* 26.4*  PLT 512* 511* 520* 500*  CREATININE 0.42* <0.30* 0.31* 0.32*  MG 1.7  --   --   --   ALBUMIN 2.6*  --   --   --   PROT 6.6  --   --   --   AST 33  --   --   --   ALT 31  --   --   --   ALKPHOS 109  --   --   --   BILITOT 0.6  --   --   --     Estimated Creatinine Clearance: 41.2 mL/min (A) (by C-G formula based on SCr of 0.32 mg/dL (L)).  Medical History: Past Medical History:  Diagnosis Date   Asthma    COPD (chronic obstructive pulmonary disease) (HCC)    Dysrhythmia    GERD (gastroesophageal reflux disease)    History of kidney stones    Hypertension    Osteoarthritis    TIA (transient ischemic attack) 2011   No Deficits   Vertigo    Infusions:   sodium chloride     sodium chloride 100 mL/hr at 10/12/21 0500   potassium chloride     Assessment: Pharmacy has been consulted to assist and monitor hypertonic saline infusion in 71yo patient who was sent to the ER from the cancer center for evaluation of loss of vision, hearing, taste, and difficulty swallowing. Patient has a history of hyponatremia and hypokalemia but lab values were worse today during follow-up visit. Potassium level of 2.7, Magnesium 1.6 and sodium of 117. Thought to initially be d/t excessive fluid intake vs. SIADH, although she states she has cut back on her fluid intake as recommended. Primary team  discussed with nephrology, who recommended starting patient on 3% saline.  Date/Time Na Rate 7/6 0854 117      N/A, admission value 7/6 1159 N/A 3% NaCl started at 30 ml/hr 7/6 1301 124 3% NaCl at 30 ml/hr 7/6 1400 N/A 3% NaCl discontinued 7/6 1505 122      N/A 7/6 1600 N/A 0.9% NaCl started at 50 ml/hr 7/6 2034 120      0.9% NaCl at 50 ml/hr 7/7 0116 121      0.9% NaCl at 50 ml/hr 7/7 0507 122      0.9% NaCl at 50 ml/hr 7/7 0903 120 0.9% NaCl at 50 ml/hr 7/7 1241 N/A 3% NaCl started at 20 ml/hr 7/7 1330 120 3% NaCl at 20 ml/hr 7/7 2200          121     3% NaCl at 20 ml/hr 7/8 0202          122     3% NaCl at 20 ml/hr 7/8 0722   125 3% NaCl at 20 ml/hr 7/8 1329  125 3% NaCl at 10 ml/hr 7/8 1704           127  3% NaCl at 10 ml/hr 7/9 0053           124     3% NaCl at 10 ml/hr 7/9 0427           126     0.9% NaCl @ 100 ml/hr  Goal of Therapy:  Contact provider if Na increases > 4 mmol/L over 2 hours or > 6 mmol/L over 4 hours Increase Na by 8 - 12 mmol / L / day  Plan:  --Continue 3% NaCl at 10 cc/hr --Fluid restriction: 1.2L --Sodium checks q4h --Continue to monitor plan per nephrology  Morganne Haile D 10/12/2021 5:42 AM

## 2021-10-12 NOTE — Consult Note (Signed)
[place] for 3% NaCl monitoring  Indication: Acute on chronic hyponatremia  Patient Measurements: Height: '4\' 8"'$  (142.2 cm) Weight: 43.7 kg (96 lb 5.5 oz) IBW/kg (Calculated) : 36.3  Intake/Output from previous day: 07/08 0701 - 07/09 0700 In: 646.8 [P.O.:120; I.V.:457.9; IV Piggyback:68.8] Out: 600 [Urine:600] Intake/Output from this shift: Total I/O In: 126.4 [I.V.:83.3; IV Piggyback:43] Out: 450 [Urine:450]  Labs: Recent Labs    10/10/21 0507 10/11/21 0722 10/12/21 0427  WBC 15.2* 14.0* 13.0*  HGB 8.9* 9.8* 8.6*  HCT 26.8* 30.2* 26.4*  PLT 511* 520* 500*  CREATININE <0.30* 0.31* 0.32*    Estimated Creatinine Clearance: 41.2 mL/min (A) (by C-G formula based on SCr of 0.32 mg/dL (L)).  Medical History: Past Medical History:  Diagnosis Date   Asthma    COPD (chronic obstructive pulmonary disease) ([place])    Dysrhythmia    GERD (gastroesophageal reflux disease)    History of kidney stones    Hypertension    Osteoarthritis    TIA (transient ischemic attack) 2011   No Deficits   Vertigo    Infusions:   sodium chloride Stopped (10/12/21 0605)   sodium chloride 100 mL/hr at 10/12/21 0737   potassium chloride 10 mEq (10/12/21 1321)   Assessment: Pharmacy has been consulted to assist and monitor hypertonic saline infusion in 70yo patient who was sent to the ER from the cancer center for evaluation of loss of vision, hearing, taste, and difficulty swallowing. Patient has a history of hyponatremia and hypokalemia but lab values were worse today during follow-up visit. Potassium level of 2.7, Magnesium 1.6 and sodium of 117. Thought to initially be d/t excessive fluid intake vs. SIADH, although she states she has cut back on her fluid intake as recommended. Primary team discussed with nephrology, who recommended starting patient on 3% saline.  Date/Time Na Rate 7/6 0854 117      N/A, admission value 7/6 1159 N/A 3% NaCl started at  30 ml/hr 7/6 1301 124 3% NaCl at 30 ml/hr 7/6 1400 N/A 3% NaCl discontinued 7/6 1505 122      N/A 7/6 1600 N/A 0.9% NaCl started at 50 ml/hr 7/6 2034 120      0.9% NaCl at 50 ml/hr 7/7 0116 121      0.9% NaCl at 50 ml/hr 7/7 0507 122      0.9% NaCl at 50 ml/hr 7/7 0903 120 0.9% NaCl at 50 ml/hr 7/7 1241 N/A 3% NaCl started at 20 ml/hr 7/7 1330 120 3% NaCl at 20 ml/hr 7/7 2200          121     3% NaCl at 20 ml/hr 7/8 0202          122     3% NaCl at 20 ml/hr 7/8 0722  125 3% NaCl at 20 ml/hr 7/8 1329 125 3% NaCl at 10 ml/hr 7/8 1704          127     3% NaCl at 10 ml/hr 7/9 0053          124     3% NaCl stopped 7/9 0427          126     0.9% NaCl at 100 ml/hr 7/9 0923  128 0.9% NaCl at 100 ml/hr 7/9 1307  129 0.9% NaCl at 165m/hr   Goal of Therapy:  Contact provider if Na increases > 4 mmol/L over 2 hours or > 6 mmol/L over 4 hours Increase  Na by 8 - 12 mmol / L / day  Plan:  --Continue NS'@100ml'$ /hr --Fluid restriction: 1.2L --Sodium checks q4h --Continue to monitor plan per nephrology  Pearla Dubonnet 10/12/2021 2:16 PM

## 2021-10-12 NOTE — Progress Notes (Signed)
Notified Judd Gaudier, MD that there was an order to stop 3% hypertonic solution at 2100. Na level was up to 127 prior to but now it is down to 124. Awaiting response from provider.

## 2021-10-12 NOTE — Progress Notes (Signed)
[facility]  ROUNDING NOTE   Subjective:   Vickie Caldwell is a 71 y.o. female with past medical history of COPD, GERD, hypertension, chronic hyponatremia and nasopharyngeal cancer. She presents to the ED at the request of the cancer center for abnormal labs. She has been admitted for Hypokalemia [E87.6] Hyponatremia [E87.1]  Patient is known to our practice and is followed by Dr Candiss Norse outpatient. She was last seen in our office on 09/18/21 for continued hyponatremia follow up.  Patient was seen today in ICU. Patient resting comfortably Patient offers no new specific physical complaints   Objective:  Vital signs in last 24 hours:  Temp:  [97.7 F (36.5 C)-98.9 F (37.2 C)] 97.7 F (36.5 C) (07/09 0736) Pulse Rate:  [92-135] 102 (07/09 1400) Resp:  [10-26] 16 (07/09 1400) BP: (91-147)/(61-121) 129/65 (07/09 1400) SpO2:  [95 %-99 %] 99 % (07/09 1400)  Weight change:  Filed Weights   10/15/2021 1004 10/27/2021 1242  Weight: 42.2 kg 43.7 kg    Intake/Output: I/O last 3 completed shifts: In: 828.1 [P.O.:120; I.V.:639.2; IV Piggyback:68.8] Out: 1400 [Urine:1400]   Intake/Output this shift:  Total I/O In: 126.4 [I.V.:83.3; IV Piggyback:43] Out: 650 [Urine:650]  Physical Exam: General: NAD  Head: Normocephalic, atraumatic. Dry oral mucosal membranes  Eyes: Anicteric  Lungs:  Clear to auscultation, normal effort, room air  Heart: Regular rate and rhythm  Abdomen:  Soft, nontender  Extremities:  No peripheral edema.  Neurologic: Nonfocal, moving all four extremities  Skin: No lesions  Access: None    Basic Metabolic Panel: Recent Labs  Lab 10/06/21 1338 10/08/2021 0854 11/03/2021 1301 10/10/21 0507 10/10/21 0903 10/11/21 0722 10/11/21 0915 10/11/21 2055 10/12/21 0053 10/12/21 0427 10/12/21 0923 10/12/21 1307  NA 120* 117*   < > 122*   < > 125*   < > 127* 124* 126* 128* 129*  K 3.2* 2.7*  --  3.8  --  3.6  --   --   --  3.1*  --   --   CL 80* 76*  --   87*  --  90*  --   --   --  92*  --   --   CO2 27 28  --  27  --  25  --   --   --  25  --   --   GLUCOSE 126* 158*  --  121*  --  111*  --   --   --  113*  --   --   BUN 9 10  --  <5*  --  <5*  --   --   --  <5*  --   --   CREATININE 0.36* 0.42*  --  <0.30*  --  0.31*  --   --   --  0.32*  --   --   CALCIUM 8.6* 8.4*  --  8.0*  --  8.9  --   --   --  8.4*  --   --   MG 1.7 1.7  --   --   --   --   --   --   --   --   --   --    < > = values in this interval not displayed.    Liver Function Tests: Recent Labs  Lab 10/06/21 1338 10/05/2021 0854  AST 25 33  ALT 26 31  ALKPHOS 94 109  BILITOT 0.7 0.6  PROT 6.4* 6.6  ALBUMIN 2.6* 2.6*  No results for input(s): "LIPASE", "AMYLASE" in the last 168 hours. No results for input(s): "AMMONIA" in the last 168 hours.  CBC: Recent Labs  Lab 10/06/21 1338 10/17/2021 0854 10/10/21 0507 10/11/21 0722 10/12/21 0427  WBC 12.1* 14.1* 15.2* 14.0* 13.0*  NEUTROABS 10.2* 12.4*  --   --   --   HGB 8.8* 9.7* 8.9* 9.8* 8.6*  HCT 26.3* 29.0* 26.8* 30.2* 26.4*  MCV 83.8 82.6 82.5 84.1 83.0  PLT 446* 512* 511* 520* 500*    Cardiac Enzymes: No results for input(s): "CKTOTAL", "CKMB", "CKMBINDEX", "TROPONINI" in the last 168 hours.  BNP: Invalid input(s): "POCBNP"  CBG: No results for input(s): "GLUCAP" in the last 168 hours.  Microbiology: Results for orders placed or performed during the hospital encounter of 10/12/2021  MRSA Next Gen by PCR, Nasal     Status: None   Collection Time: 10/28/2021 12:43 PM   Specimen: Nasal Mucosa; Nasal Swab  Result Value Ref Range Status   MRSA by PCR Next Gen NOT DETECTED NOT DETECTED Final    Comment: (NOTE) The GeneXpert MRSA Assay (FDA approved for NASAL specimens only), is one component of a comprehensive MRSA colonization surveillance program. It is not intended to diagnose MRSA infection nor to guide or monitor treatment for MRSA infections. Test performance is not FDA approved in patients less than  64 years old. Performed at [facility], [address] [place], [place] [address]     Coagulation Studies: No results for input(s): "LABPROT", "INR" in the last 72 hours.  Urinalysis: No results for input(s): "COLORURINE", "LABSPEC", "PHURINE", "GLUCOSEU", "HGBUR", "BILIRUBINUR", "KETONESUR", "PROTEINUR", "UROBILINOGEN", "NITRITE", "LEUKOCYTESUR" in the last 72 hours.  Invalid input(s): "APPERANCEUR"     Imaging: MR BRAIN W WO CONTRAST  Result Date: 10/11/2021 CLINICAL DATA:  Chronic headache EXAM: MRI HEAD WITHOUT AND WITH CONTRAST TECHNIQUE: Multiplanar, multiecho pulse sequences of the brain and surrounding structures were obtained without and with intravenous contrast. CONTRAST:  58m GADAVIST GADOBUTROL 1 MMOL/ML IV SOLN COMPARISON:  Head CT 08/07/2021 Head CT 06/10/2021 FINDINGS: Brain: No acute infarct, mass effect or extra-axial collection. No acute or chronic hemorrhage. There is multifocal hyperintense T2-weighted signal within the white matter. Generalized volume loss. The midline structures are normal. Vascular: Major flow voids are preserved. Skull and upper cervical spine: Normal calvarium and skull base. Visualized upper cervical spine and soft tissues are normal. Sinuses/Orbits:Bilateral mastoid effusions. Moderate mucosal thickening of the sphenoid sinuses. Normal orbits. There is abnormal contrast enhancement of the nasopharynx and prevertebral soft tissues. Diffusely abnormal bone marrow signal within the clivus, which is likely invaded by adjacent tumor. Abnormal contrast enhancement extends bilaterally into the parapharyngeal spaces. IMPRESSION: 1. No acute intracranial abnormality. 2. No nasopharyngeal carcinoma with large area of abnormal contrast enhancement extending into the prevertebral and parapharyngeal soft tissues as well as invading the clivus. 3. Bilateral mastoid effusions and moderate mucosal thickening of the sphenoid sinuses. Electronically Signed    By: KUlyses JarredM.D.   On: 10/11/2021 02:05     Medications:    sodium chloride Stopped (10/12/21 0605)   sodium chloride 100 mL/hr at 10/12/21 1530   potassium chloride 10 mEq (10/12/21 1531)    aspirin  81 mg Oral Daily   Chlorhexidine Gluconate Cloth  6 each Topical Daily   enoxaparin (LOVENOX) injection  30 mg Subcutaneous Q24H   fluticasone  1 spray Each Nare Daily   lidocaine-prilocaine  1 Application Topical Once   metoprolol tartrate  12.5 mg Oral BID  montelukast  10 mg Oral Daily   nicotine  7 mg Transdermal Daily   mouth rinse  15 mL Mouth Rinse 4 times per day   pantoprazole (PROTONIX) IV  40 mg Intravenous Daily   sodium chloride flush  3 mL Intravenous Q12H   umeclidinium bromide  1 puff Inhalation Daily   sodium chloride, acetaminophen, albuterol, diazepam, hydrALAZINE, lactulose, morphine CONCENTRATE, ondansetron **OR** ondansetron (ZOFRAN) IV, mouth rinse, sodium chloride flush  Assessment/ Plan:  Ms. ANJELA CASSARA is a 71 y.o.  female  with past medical history of COPD, GERD, hypertension, chronic hyponatremia and nasopharyngeal cancer. She presents to the ED at the request of the cancer center for abnormal labs. 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  Patient hypertonic saline was stopped earlier secondary to overcorrection Patient was later placed back on 3% hypertonic solution at a reduced rate, 51m/hr.   On July 8 morning I reduced patient's 3% saline rate to 10 mL/h   Patient sodium had  improved from 117 on July 6 127 on July 8 So I discontinued patient's 3% normal saline on July 8 evening  Patient sodium is now better at 129 Will continue to monitor sodium every 8 hours.  Patient sodium was 117 on July 6 and now on July 9 it is at 129. We did follow the goal of sodium increase no more than 8 mmol/L in 24 hours.   Lab Results  Component Value Date   CREATININE 0.32 (L) 10/12/2021   CREATININE 0.31 (L) 10/11/2021   CREATININE <0.30 (L) 10/10/2021    Intake/Output Summary (Last 24 hours) at 10/12/2021 1624 Last data filed at 10/12/2021 1448 Gross per 24 hour  Intake 700.15 ml  Output 1050 ml  Net -349.85 ml   2. Hypokalemia, Patient potassium remains low Patient is on potassium supplements   3. Hypertension, essential.  Blood pressure is at goal  4.Anemia of chronic disease Patient hemoglobin is low No need for PRBC for now      LOS: 3 Dyquan Minks s [facility]7/9/20234:24 PM

## 2021-10-13 ENCOUNTER — Ambulatory Visit: Payer: Medicare HMO

## 2021-10-13 ENCOUNTER — Other Ambulatory Visit: Payer: Self-pay

## 2021-10-13 DIAGNOSIS — I4891 Unspecified atrial fibrillation: Secondary | ICD-10-CM

## 2021-10-13 DIAGNOSIS — C119 Malignant neoplasm of nasopharynx, unspecified: Secondary | ICD-10-CM | POA: Diagnosis not present

## 2021-10-13 DIAGNOSIS — J392 Other diseases of pharynx: Secondary | ICD-10-CM

## 2021-10-13 DIAGNOSIS — E871 Hypo-osmolality and hyponatremia: Secondary | ICD-10-CM | POA: Diagnosis not present

## 2021-10-13 DIAGNOSIS — F17210 Nicotine dependence, cigarettes, uncomplicated: Secondary | ICD-10-CM

## 2021-10-13 LAB — BASIC METABOLIC PANEL
Anion gap: 10 (ref 5–15)
Anion gap: 13 (ref 5–15)
Anion gap: 12 (ref 5–15)
BUN: <5 mg/dL — ABNORMAL LOW (ref 8–23)
BUN: <5 mg/dL — ABNORMAL LOW (ref 8–23)
BUN: <5 mg/dL — ABNORMAL LOW (ref 8–23)
CO2: 25 mmol/L (ref 22–32)
CO2: 22 mmol/L (ref 22–32)
CO2: 24 mmol/L (ref 22–32)
Calcium: 8.4 mg/dL — ABNORMAL LOW (ref 8.9–10.3)
Calcium: 8.3 mg/dL — ABNORMAL LOW (ref 8.9–10.3)
Calcium: 7.8 mg/dL — ABNORMAL LOW (ref 8.9–10.3)
Chloride: 94 mmol/L — ABNORMAL LOW (ref 98–111)
Chloride: 93 mmol/L — ABNORMAL LOW (ref 98–111)
Chloride: 90 mmol/L — ABNORMAL LOW (ref 98–111)
Creatinine, Ser: 0.32 mg/dL — ABNORMAL LOW (ref 0.44–1.00)
Creatinine, Ser: 0.4 mg/dL — ABNORMAL LOW (ref 0.44–1.00)
Creatinine, Ser: <0.3 mg/dL — ABNORMAL LOW (ref 0.44–1.00)
GFR, Estimated: >60 mL/min (ref >60)
GFR, Estimated: >60 mL/min (ref >60)
Glucose, Bld: 103 mg/dL — ABNORMAL HIGH (ref 70–99)
Glucose, Bld: 98 mg/dL (ref 70–99)
Glucose, Bld: 125 mg/dL — ABNORMAL HIGH (ref 70–99)
Potassium: 2.5 mmol/L — CL (ref 3.5–5.1)
Potassium: 2.9 mmol/L — ABNORMAL LOW (ref 3.5–5.1)
Potassium: 3.4 mmol/L — ABNORMAL LOW (ref 3.5–5.1)
Sodium: 129 mmol/L — ABNORMAL LOW (ref 135–145)
Sodium: 128 mmol/L — ABNORMAL LOW (ref 135–145)
Sodium: 126 mmol/L — ABNORMAL LOW (ref 135–145)

## 2021-10-13 LAB — CBC
HCT: 25.8 % — ABNORMAL LOW (ref 36.0–46.0)
Hemoglobin: 8.2 g/dL — ABNORMAL LOW (ref 12.0–15.0)
MCH: 26.6 pg (ref 26.0–34.0)
MCHC: 31.8 g/dL (ref 30.0–36.0)
MCV: 83.8 fL (ref 80.0–100.0)
Platelets: 375 10*3/uL (ref 150–400)
RBC: 3.08 MIL/uL — ABNORMAL LOW (ref 3.87–5.11)
RDW: 13.2 % (ref 11.5–15.5)
WBC: 14.2 10*3/uL — ABNORMAL HIGH (ref 4.0–10.5)
nRBC: 0 % (ref 0.0–0.2)

## 2021-10-13 LAB — MAGNESIUM: Magnesium: 1.7 mg/dL (ref 1.7–2.4)

## 2021-10-13 LAB — TSH: TSH: 1.275 u[IU]/mL (ref 0.350–4.500)

## 2021-10-13 MED ORDER — POTASSIUM CHLORIDE 10 MEQ/100ML IV SOLN
10.0000 meq | INTRAVENOUS | Status: AC
  Administered 2021-10-13 (×4): 10 meq via INTRAVENOUS
  Filled 2021-10-13 (×4): qty 100

## 2021-10-13 MED ORDER — DILTIAZEM HCL 25 MG/5ML IV SOLN
INTRAVENOUS | Status: AC
  Filled 2021-10-13: qty 5

## 2021-10-13 MED ORDER — POTASSIUM CHLORIDE 10 MEQ/100ML IV SOLN
10.0000 meq | INTRAVENOUS | Status: AC
  Administered 2021-10-13 (×5): 10 meq via INTRAVENOUS
  Filled 2021-10-13 (×5): qty 100

## 2021-10-13 MED ORDER — AMIODARONE HCL IN DEXTROSE 360-4.14 MG/200ML-% IV SOLN
30.0000 mg/h | INTRAVENOUS | Status: DC
  Administered 2021-10-14 – 2021-10-16 (×5): 30 mg/h via INTRAVENOUS
  Filled 2021-10-13 (×5): qty 200

## 2021-10-13 MED ORDER — AMIODARONE LOAD VIA INFUSION
150.0000 mg | Freq: Once | INTRAVENOUS | Status: AC
  Administered 2021-10-13: 150 mg via INTRAVENOUS
  Filled 2021-10-13: qty 83.34

## 2021-10-13 MED ORDER — MAGNESIUM SULFATE 2 GM/50ML IV SOLN
2.0000 g | Freq: Once | INTRAVENOUS | Status: AC
  Administered 2021-10-13: 2 g via INTRAVENOUS
  Filled 2021-10-13: qty 50

## 2021-10-13 MED ORDER — DILTIAZEM HCL 25 MG/5ML IV SOLN
10.0000 mg | Freq: Once | INTRAVENOUS | Status: AC
  Administered 2021-10-13: 10 mg via INTRAVENOUS
  Filled 2021-10-13: qty 5

## 2021-10-13 MED ORDER — METOPROLOL TARTRATE 5 MG/5ML IV SOLN
2.5000 mg | Freq: Two times a day (BID) | INTRAVENOUS | Status: DC
  Administered 2021-10-13 – 2021-10-15 (×6): 2.5 mg via INTRAVENOUS
  Filled 2021-10-13 (×6): qty 5

## 2021-10-13 MED ORDER — AMIODARONE HCL IN DEXTROSE 360-4.14 MG/200ML-% IV SOLN
60.0000 mg/h | INTRAVENOUS | Status: DC
  Administered 2021-10-13 (×2): 60 mg/h via INTRAVENOUS
  Filled 2021-10-13 (×2): qty 200

## 2021-10-13 MED ORDER — VANCOMYCIN HCL IN DEXTROSE 1-5 GM/200ML-% IV SOLN: 1000.0000 mg | INTRAVENOUS | Status: DC

## 2021-10-13 NOTE — Progress Notes (Signed)
Vickie Caldwell   DOB:[birth year]   MW#:[id number]    Subjective: Patient resting in the bed.  No acute events over the weekend.   Patient continues to have difficulty hearing.  PEG tube placement this afternoon plan with IR.  Objective:  Vitals:   10/13/21 1730 10/13/21 1745  BP: 106/72 108/69  Pulse: (!) 39 (!) 133  Resp: 13 18  Temp:    SpO2: 98% 96%     Intake/Output Summary (Last 24 hours) at 10/13/2021 2059 Last data filed at 10/13/2021 1845 Gross per 24 hour  Intake 1264.27 ml  Output 1325 ml  Net -60.73 ml  Thin cachectic appearing female patient resting in the bed.  No acute distress.  Oral ulceration present.  Difficulty hearing. Physical Exam Vitals and nursing note reviewed.  HENT:     Head: Normocephalic and atraumatic.     Mouth/Throat:     Pharynx: Oropharynx is clear.  Eyes:     Extraocular Movements: Extraocular movements intact.     Pupils: Pupils are equal, round, and reactive to light.  Cardiovascular:     Rate and Rhythm: Normal rate and regular rhythm.  Pulmonary:     Comments: Decreased breath sounds bilaterally.  Abdominal:     Palpations: Abdomen is soft.  Musculoskeletal:        General: Normal range of motion.     Cervical back: Normal range of motion.  Skin:    General: Skin is warm.  Neurological:     General: No focal deficit present.     Mental Status: She is alert and oriented to person, place, and time.  Psychiatric:        Behavior: Behavior normal.        Judgment: Judgment normal.      Labs:  Lab Results  Component Value Date   WBC 13.0 (H) 10/12/2021   HGB 8.6 (L) 10/12/2021   HCT 26.4 (L) 10/12/2021   MCV 83.0 10/12/2021   PLT 500 (H) 10/12/2021   NEUTROABS 12.4 (H) 10/29/2021    Lab Results  Component Value Date   NA 128 (L) 10/13/2021   K 2.9 (L) 10/13/2021   CL 93 (L) 10/13/2021   CO2 22 10/13/2021    Studies:  No results found.  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  Tolerating poorly [oral mucositis; severe hyponatremia; dehydration; hearing loss-see below].  Therapy currently on hold.  MRI brain with and without contrast-overall stable nasopharyngeal malignancy possibly involving clivus.  However edema from radiation can be a confounding factor.   #Acute oral mucositis-secondary chemoradiation.  On IV narcotic pain medication.  #Severe electrolyte abnormalities including acute on chronic hyponatremia/hypokalemia secondary SIADH-cisplatin chemotherapy etc.  On electrolyte supplementation/followed by nephrology.  #Severe malnutrition-secondary to oral mucositis/underlying throat malignancy.-Await PEG tube placement this afternoon.  Discussed with nursing staff.  #Hearing loss-question otitis media versus cisplatin ototoxicity.  Await ENT evaluation.   Addendum: Patient noted to have A-fib with RVR and also for airway given underlying throat malignancy-and hence PEG tube placement was aborted by IR.  General surgery consulted for PEG tube placement.  However given airway issues/and also A-fib-on anticoagulation; PEG tube placement is currently on hold.  Awaiting ENT evaluation.  Cammie Sickle, MD 10/13/2021  8:59 PM

## 2021-10-13 NOTE — Consult Note (Signed)
[facility] SURGICAL CONSULTATION NOTE (initial)   HISTORY OF PRESENT ILLNESS (HPI):  71 y.o. female admitted July 6 from home.  She has locally advanced nasopharyngeal carcinoma, prior to admission treated with weekly cisplatin and radiation.  She had been progressively unable to take adequate oral intake, was admitted with hyponatremia and hypokalemia.  She is currently in atrial fibrillation with RVR.  IR did not proceed with G-tube due to airway concerns.  Surgery is consulted by hospitalist physician Dr. Sheppard Coil in this context for evaluation and consideration of creation of feeding gastrostomy tube.  PAST MEDICAL HISTORY (PMH):  Past Medical History:  Diagnosis Date   Asthma    COPD (chronic obstructive pulmonary disease) (HCC)    Dysrhythmia    GERD (gastroesophageal reflux disease)    History of kidney stones    Hypertension    Osteoarthritis    TIA (transient ischemic attack) 2011   No Deficits   Vertigo      PAST SURGICAL HISTORY ([place]):  Past Surgical History:  Procedure Laterality Date   ABDOMINAL HYSTERECTOMY     BREAST BIOPSY Left 1980's   neg. Pt states punch biopsy at the nipple   IR IMAGING GUIDED PORT INSERTION  08/14/2021   LYMPH NODE DISSECTION Right    neck   MANDIBLE SURGERY     Sagittal split   NASAL ENDOSCOPY Bilateral 07/23/2021   Procedure: NASAL ENDOSCOPY WITH BIOPSY OF NASOPHARYNGEAL MASS;  Surgeon: Clyde Canterbury, MD;  Location: [facility] ORS;  Service: ENT;  Laterality: Bilateral;   THROAT SURGERY     Vocal cord polyps "burned"   TUBAL LIGATION     WISDOM TOOTH EXTRACTION       MEDICATIONS:  Prior to Admission medications   Medication Sig Start Date End Date Taking? Authorizing Provider  amLODipine (NORVASC) 10 MG tablet Take 10 mg by mouth daily.   Yes [provider]  diazepam (VALIUM) 5 MG tablet Take 1 tablet (5 mg total) by mouth every 12 (twelve) hours as needed. 10/01/21  Yes Cammie Sickle, MD  fluticasone  (FLONASE) 50 MCG/ACT nasal spray Place into both nostrils daily.   Yes [provider]  metoprolol succinate (TOPROL-XL) 100 MG 24 hr tablet Take 100 mg by mouth daily. Take with or immediately following a meal.   Yes [provider]  montelukast (SINGULAIR) 10 MG tablet Take 10 mg by mouth daily.   Yes [provider]  morphine (MS CONTIN) 30 MG 12 hr tablet Take 1 tablet (30 mg total) by mouth every 12 (twelve) hours. 09/12/21  Yes Cammie Sickle, MD  Oxycodone HCl 10 MG TABS Take 1 tablet (10 mg total) by mouth every 4 (four) hours as needed. 09/29/21  Yes Cammie Sickle, MD  potassium chloride 20 MEQ/15ML (10%) SOLN Take 15 mLs (20 mEq total) by mouth 3 (three) times daily. 09/29/21  Yes Cammie Sickle, MD  umeclidinium bromide (INCRUSE ELLIPTA) 62.5 MCG/ACT AEPB Inhale 1 puff into the lungs daily.   Yes [provider]  acetaminophen (TYLENOL) 325 MG tablet Take 650 mg by mouth every 6 (six) hours as needed.    [provider]  albuterol (VENTOLIN HFA) 108 (90 Base) MCG/ACT inhaler Inhale into the lungs every 6 (six) hours as needed for wheezing or shortness of breath.    [provider]  ASPIRIN 81 PO Take by mouth daily.    [provider]  Cholecalciferol (VITAMIN D3 PO) Take by mouth daily.  [provider]  famotidine (PEPCID) 10 MG tablet Take 20 mg by mouth daily.    [provider]  HYDROcodone-acetaminophen (NORCO) 10-325 MG tablet Take 1 tablet by mouth every 6 (six) hours as needed. 09/25/21   Cammie Sickle, MD  lactulose (CHRONULAC) 10 GM/15ML solution Take 15 mLs (10 g total) by mouth 2 (two) times daily as needed for moderate constipation. 09/19/21   Verlon Au, NP  lidocaine-prilocaine (EMLA) cream Apply on the port. 30 -45 min  prior to port access. 08/11/21   Cammie Sickle, MD  ondansetron (ZOFRAN) 8 MG tablet One pill every 8 hours as needed for nausea/vomitting.  08/11/21   Cammie Sickle, MD  potassium chloride SA (KLOR-CON M) 20 MEQ tablet 1 pill twice a day Patient not taking: Reported on 11/02/2021 09/25/21   Cammie Sickle, MD  promethazine (PHENERGAN) 25 MG tablet Take 0.5 tablets (12.5 mg total) by mouth every 8 (eight) hours as needed for refractory nausea / vomiting. 09/05/21   Verlon Au, NP     ALLERGIES:  Allergies  Allergen Reactions   Codeine Nausea And Vomiting   Penicillins Rash     SOCIAL HISTORY:  Social History   Socioeconomic History   Marital status: Single    Spouse name: Not on file   Number of children: Not on file   Years of education: Not on file   Highest education level: Not on file  Occupational History   Not on file  Tobacco Use   Smoking status: Every Day    Packs/day: 1.00    Years: 51.00    Total pack years: 51.00    Types: Cigarettes   Smokeless tobacco: Never   Tobacco comments:    Started smoking around age 69  Vaping Use   Vaping Use: Never used  Substance and Sexual Activity   Alcohol use: Yes    Alcohol/week: 1.0 standard drink of alcohol    Types: 1 Cans of beer per week    Comment: occasional   Drug use: Never   Sexual activity: Not Currently    Birth control/protection: None  Other Topics Concern   Not on file  Social History Narrative   Smoker; sometimes beer/wine; used to work for Therapist, 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 Lives in [place]. With daughter-ZES//brother. From [place].    Social Determinants of Health   Financial Resource Strain: Not on file  Food Insecurity: Not on file  Transportation Needs: No Transportation Needs (10/02/2021)   PRAPARE - Hydrologist (Medical): No    Lack of Transportation (Non-Medical): No  Physical Activity: Not on file  Stress: Not on file  Social Connections: Not on file  Intimate Partner Violence: Not on file     FAMILY HISTORY:  Family History  Problem Relation Age of Onset   Coronary artery  disease Mother    Cancer Father 61       Oral   Cancer Brother 35       testicular   Prostate cancer Brother    Heart failure Brother    Breast cancer Neg Hx       REVIEW OF SYSTEMS:  Review of Systems  Unable to perform ROS: Acuity of condition    VITAL SIGNS:  Temp:  [97.8 F (36.6 C)-98.2 F (36.8 C)] 98.2 F (36.8 C) (07/10 1200) Pulse Rate:  [39-120] 39 (07/10 1400) Resp:  [10-21] 16 (07/10 1400) BP: (70-139)/(56-87) 83/62 (07/10 1445) SpO2:  [  95 %-100 %] 97 % (07/10 1445)     Height: '4\' 8"'$  (142.2 cm) Weight: 43.7 kg BMI (Calculated): 21.61   INTAKE/OUTPUT:  07/09 0701 - 07/10 0700 In: 2596.2 [I.V.:2452.3; IV Piggyback:144] Out: 1007 [Urine:1875]  PHYSICAL EXAM:  Physical Exam Blood pressure (!) 83/62, pulse (!) 39, temperature 98.2 F (36.8 C), resp. rate 16, height '4\' 8"'$  (1.422 m), weight 43.7 kg, SpO2 97 %. Last Weight  Most recent update: 11/02/2021  3:27 PM    Weight  43.7 kg (96 lb 5.5 oz)             CONSTITUTIONAL: Well developed, malnourished/thin patient, appropriately responsive and aware without distress.   EYES: Sclera non-icteric.   EARS, NOSE, MOUTH AND THROAT:  The oropharynx is coated with dried secretions. Oral mucosa is pink.    Hearing is difficult to none.  Temporal wasting. NECK: Trachea is midline, and there is no jugular venous distension.  However there are radiation changes noted. RESPIRATORY:  Lungs are clear, and breath sounds are equal bilaterally. Normal respiratory effort without pathologic use of accessory muscles. CARDIOVASCULAR: Heart is irregular and tachycardic GI: The abdomen is soft, nontender, and nondistended. There were no palpable masses.There were normal bowel sounds. MUSCULOSKELETAL:  Symmetrical muscle tone appreciated.  SKIN: Skin turgor is normal. No pathologic skin lesions appreciated.  NEUROLOGIC:  Motor and sensation appear grossly normal.  Cranial nerves are grossly without defect. PSYCH:  Alert and oriented  to person, place and time. Affect is appropriate for situation.  Data Reviewed I have personally reviewed what is currently available of the patient's imaging, recent labs and medical records.    Labs:     Latest Ref Rng & Units 10/12/2021    4:27 AM 10/11/2021    7:22 AM 10/10/2021    5:07 AM  CBC  WBC 4.0 - 10.5 K/uL 13.0  14.0  15.2   Hemoglobin 12.0 - 15.0 g/dL 8.6  9.8  8.9   Hematocrit 36.0 - 46.0 % 26.4  30.2  26.8   Platelets 150 - 400 K/uL 500  520  511       Latest Ref Rng & Units 10/13/2021    4:24 AM 10/12/2021    9:47 PM 10/12/2021    1:07 PM  CMP  Glucose 70 - 99 mg/dL 103     BUN 8 - 23 mg/dL <5     Creatinine 0.44 - 1.00 mg/dL 0.32     Sodium 135 - 145 mmol/L 129  128  129   Potassium 3.5 - 5.1 mmol/L 2.5     Chloride 98 - 111 mmol/L 94     CO2 22 - 32 mmol/L 25     Calcium 8.9 - 10.3 mg/dL 8.4        Imaging studies:   Last 24 hrs: No results found.   Assessment/Plan:  71 y.o. 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  However we believe the greatest concerns are obtaining an adequate airway for anesthesia, and whether or not she would be extubatable post procedure.   -Consider consultation with ENT, consideration of alternate airway in light of above.  -Obvious immediate concerns prevail  over any elective intervention at this time.  -We will follow with you, do not anticipate any or intervention until later this week at earliest.   All of the above findings and recommendations were discussed with the medical team, deferring discussion with patient and  family, pending her responsiveness to the more urgent issues at hand. We will continue to follow along with her for now. Thank you for the opportunity to participate in this patient's care.   -- Ronny Bacon, M.D., FACS 10/13/2021, 3:41 PM

## 2021-10-13 NOTE — Progress Notes (Signed)
Neomia Glass, NP notified of critical potassium 2.5, see new orders.

## 2021-10-13 NOTE — Assessment & Plan Note (Signed)
New onset 10/13/21   Did not convert with IV push Cardizem, BP dropped  Consulted cardiology, she was started on Amiodarone bolus/drip  Labs pending, including repeat BMP (potassium was ordered to be repleted this morning was low at 2.7), magnesium, TSH  Echocardiogram  Ideally, would like to avoid anticoagulation or if needed would prefer heparin, trying to place PEG tube for her, IR cannot place this d/t anatomy risk, Gen Surg consulted and will need to await cardiac stabilization

## 2021-10-13 NOTE — Progress Notes (Signed)
IR received request for percutaneous gastrostomy tube placement. Patient was seen and evaluated and unfortunately given the difficult airway assessment with obstructing tissue in the oral cavity, we would not be able to safely place the gastrostomy tube percutaneously. Recommend surgical consultation.   Hedy Jacob, PA-C 10/13/2021, 2:51 PM

## 2021-10-13 NOTE — Consult Note (Cosign Needed)
[facility] [facility] NOTE       Patient ID: Vickie Caldwell MRN: [id number] DOB/AGE: 05/18/1950 71 y.o.  Admit date: 11/01/2021 Referring Physician Dr. 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  She was apparently unable to take her home medicines due to nausea for several days and also had ongoing diarrhea.  Cardiology is consulted on hospital day 4 due to new onset atrial fibrillation with RVR and medical optimization prior to surgical placement of PEG tube.  She is very hard of hearing so communication is done in primarily writing and history is primarily obtained through the chart.  She writes that she feels "terrible" and that she has felt kind of dizzy but denies chest pain or shortness of breath.  She says her mouth hurts and has not been able to swallow well.  She denies prior history of abnormal heart rhythms or history of heart attack, although chart review says she saw cardiology most recently in 2016 for history of paroxysmal SVT stable on metoprolol at that time.  Per review of telemetry she had a paroxysm of SVT last night around 10 PM for 5 minutes or so which converted back to sinus tachycardia with rates in the low 100s until this afternoon she developed atrial fibrillation with RVR, rates in the 160s during interview.  During this hospitalization she has been continued on her home metoprolol tartrate initially at 25 twice daily, this decreased to 12.5 twice daily x3 doses, and then was given a dose of IV metoprolol  2.5 as she was n.p.o. this afternoon and a one-time dose of IV of diltiazem without much effect.  She has had significant electrolyte disturbances and poor p.o. intake with nausea prior to admission and was scheduled to undergo PEG tube with IR today, however this was unable to be done from an IR standpoint and a surgical intervention was recommended.  Labs are notable for multiple electrolyte disturbances, but uptrending sodium from 117 on 7/6 to 129 today after hypertonic saline and several days of IV fluids.  Initially her potassium of 2.7 and magnesium was 1.7.  Most recent labs from this morning reveal potassium of 2.5, creatinine is 0.32, GFR greater than 60.  H&H around her baseline of 8.6/26.4.  Chest x-ray on admission was without active cardiopulmonary disease.  Brain MRI showing nasopharyngeal carcinoma with a large area of abnormal contrast-enhancement ends extending into the prevertebral and parapharyngeal soft tissues.   Review of systems complete and found to be negative unless listed above     Past Medical History:  Diagnosis Date   Asthma    COPD (chronic obstructive pulmonary disease) (HCC)    Dysrhythmia    GERD (gastroesophageal reflux disease)    History of kidney stones    Hypertension    Osteoarthritis    TIA (transient ischemic attack) 2011   No Deficits   Vertigo     Past Surgical History:  Procedure Laterality Date   ABDOMINAL HYSTERECTOMY     BREAST BIOPSY Left 1980's   neg. Pt states punch biopsy at the nipple   IR IMAGING GUIDED PORT  INSERTION  08/14/2021   LYMPH NODE DISSECTION Right    neck   MANDIBLE SURGERY     Sagittal split   NASAL ENDOSCOPY Bilateral 07/23/2021   Procedure: NASAL ENDOSCOPY WITH BIOPSY OF NASOPHARYNGEAL MASS;  Surgeon: Clyde Canterbury, MD;  Location: [facility] ORS;  Service: ENT;  Laterality: Bilateral;   THROAT SURGERY     Vocal cord polyps "burned"   TUBAL LIGATION     WISDOM TOOTH EXTRACTION      Medications Prior to Admission   Medication Sig Dispense Refill Last Dose   amLODipine (NORVASC) 10 MG tablet Take 10 mg by mouth daily.   Past Week   diazepam (VALIUM) 5 MG tablet Take 1 tablet (5 mg total) by mouth every 12 (twelve) hours as needed. 60 tablet 0 Past Week   fluticasone (FLONASE) 50 MCG/ACT nasal spray Place into both nostrils daily.   Past Week   metoprolol succinate (TOPROL-XL) 100 MG 24 hr tablet Take 100 mg by mouth daily. Take with or immediately following a meal.   Past Week   montelukast (SINGULAIR) 10 MG tablet Take 10 mg by mouth daily.   Past Week   morphine (MS CONTIN) 30 MG 12 hr tablet Take 1 tablet (30 mg total) by mouth every 12 (twelve) hours. 60 tablet 0 Past Week   Oxycodone HCl 10 MG TABS Take 1 tablet (10 mg total) by mouth every 4 (four) hours as needed. 84 tablet 0 Past Week   potassium chloride 20 MEQ/15ML (10%) SOLN Take 15 mLs (20 mEq total) by mouth 3 (three) times daily. 473 mL 2 Past Week   umeclidinium bromide (INCRUSE ELLIPTA) 62.5 MCG/ACT AEPB Inhale 1 puff into the lungs daily.   Past Week   acetaminophen (TYLENOL) 325 MG tablet Take 650 mg by mouth every 6 (six) hours as needed.   prn at prn   albuterol (VENTOLIN HFA) 108 (90 Base) MCG/ACT inhaler Inhale into the lungs every 6 (six) hours as needed for wheezing or shortness of breath.   prn at prn   ASPIRIN 81 PO Take by mouth daily.      Cholecalciferol (VITAMIN D3 PO) Take by mouth daily.      famotidine (PEPCID) 10 MG tablet Take 20 mg by mouth daily.      HYDROcodone-acetaminophen (NORCO) 10-325 MG tablet Take 1 tablet by mouth every 6 (six) hours as needed. 45 tablet 0 prn at prn   lactulose (CHRONULAC) 10 GM/15ML solution Take 15 mLs (10 g total) by mouth 2 (two) times daily as needed for moderate constipation. 236 mL 0 prn at prn   lidocaine-prilocaine (EMLA) cream Apply on the port. 30 -45 min  prior to port access. 30 g 3 prn at prn   ondansetron (ZOFRAN) 8 MG tablet One pill every 8 hours as needed for nausea/vomitting.  40 tablet 1 prn at prn   potassium chloride SA (KLOR-CON M) 20 MEQ tablet 1 pill twice a day (Patient not taking: Reported on 10/19/2021) 60 tablet 3 Not Taking   promethazine (PHENERGAN) 25 MG tablet Take 0.5 tablets (12.5 mg total) by mouth every 8 (eight) hours as needed for refractory nausea / vomiting. 30 tablet 0 prn at prn   Social History   Socioeconomic History   Marital status: Single    Spouse name: Not on file   Number of children: Not on file   Years of education: Not on file   Highest education level: Not on file  Occupational History   Not  on file  Tobacco Use   Smoking status: Every Day    Packs/day: 1.00    Years: 51.00    Total pack years: 51.00    Types: Cigarettes   Smokeless tobacco: Never   Tobacco comments:    Started smoking around age 52  Vaping Use   Vaping Use: Never used  Substance and Sexual Activity   Alcohol use: Yes    Alcohol/week: 1.0 standard drink of alcohol    Types: 1 Cans of beer per week    Comment: occasional   Drug use: Never   Sexual activity: Not Currently    Birth control/protection: None  Other Topics Concern   Not on file  Social History Narrative   Smoker; sometimes beer/wine; used to work for Therapist, 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 Lives in [place]. With daughter-ZES//brother. From [place].    Social Determinants of Health   Financial Resource Strain: Not on file  Food Insecurity: Not on file  Transportation Needs: No Transportation Needs (10/02/2021)   PRAPARE - Hydrologist (Medical): No    Lack of Transportation (Non-Medical): No  Physical Activity: Not on file  Stress: Not on file  Social Connections: Not on file  Intimate Partner Violence: Not on file    Family History  Problem Relation Age of Onset   Coronary artery disease Mother    Cancer Father 64       Oral   Cancer Brother 95       testicular   Prostate cancer Brother    Heart failure Brother    Breast cancer Neg Hx        PHYSICAL EXAM General: Elderly and cachectic Caucasian female in no acute distress.  Sitting upright in ICU bed HEENT:  Normocephalic and atraumatic.  Very hard of hearing, hoarse voice Neck:  No JVD.  Chest: Right-sided port Lungs: Normal respiratory effort on room air. Clear bilaterally to auscultation. No wheezes, crackles, rhonchi.  Heart: Irregularly irregular rate and rhythm. Normal S1 and S2 without gallops or murmurs. Radial & DP pulses 2+ bilaterally. Abdomen: Non-distended appearing.  Msk: Normal strength and tone for age. Extremities: Warm and well perfused. No clubbing, cyanosis.  No peripheral edema.  Neuro: Alert and oriented X 3. Psych:  Answers questions appropriately.   Labs:   Lab Results  Component Value Date   WBC 13.0 (H) 10/12/2021   HGB 8.6 (L) 10/12/2021   HCT 26.4 (L) 10/12/2021   MCV 83.0 10/12/2021   PLT 500 (H) 10/12/2021    Recent Labs  Lab 10/23/2021 0854 10/28/2021 1301 10/13/21 0424  NA 117*   < > 129*  K 2.7*   < > 2.5*  CL 76*   < > 94*  CO2 28   < > 25  BUN 10   < > <5*  CREATININE 0.42*   < > 0.32*  CALCIUM 8.4*   < > 8.4*  PROT 6.6  --   --   BILITOT 0.6  --   --   ALKPHOS 109  --   --   ALT 31  --   --   AST 33  --   --   GLUCOSE 158*   < > 103*   < > = values in this interval not displayed.   No results found for: "CKTOTAL", "CKMB", "CKMBINDEX", "TROPONINI" No results found for: "CHOL" No results found for: "HDL" No results found for: "LDLCALC" No results found for: "TRIG" No results found  for: "CHOLHDL" No results found for: "LDLDIRECT"    Radiology: MR BRAIN W WO CONTRAST  Result Date: 10/11/2021 CLINICAL DATA:  Chronic headache EXAM: MRI HEAD WITHOUT AND WITH CONTRAST TECHNIQUE: Multiplanar, multiecho pulse sequences of the brain and surrounding structures were obtained without and with intravenous contrast. CONTRAST:  63m GADAVIST GADOBUTROL 1 MMOL/ML IV SOLN COMPARISON:  Head CT 08/07/2021 Head CT 06/10/2021 FINDINGS:  Brain: No acute infarct, mass effect or extra-axial collection. No acute or chronic hemorrhage. There is multifocal hyperintense T2-weighted signal within the white matter. Generalized volume loss. The midline structures are normal. Vascular: Major flow voids are preserved. Skull and upper cervical spine: Normal calvarium and skull base. Visualized upper cervical spine and soft tissues are normal. Sinuses/Orbits:Bilateral mastoid effusions. Moderate mucosal thickening of the sphenoid sinuses. Normal orbits. There is abnormal contrast enhancement of the nasopharynx and prevertebral soft tissues. Diffusely abnormal bone marrow signal within the clivus, which is likely invaded by adjacent tumor. Abnormal contrast enhancement extends bilaterally into the parapharyngeal spaces. IMPRESSION: 1. No acute intracranial abnormality. 2. No nasopharyngeal carcinoma with large area of abnormal contrast enhancement extending into the prevertebral and parapharyngeal soft tissues as well as invading the clivus. 3. Bilateral mastoid effusions and moderate mucosal thickening of the sphenoid sinuses. Electronically Signed   By: KUlyses JarredM.D.   On: 10/11/2021 02:05   DG Chest Portable 1 View  Result Date: 10/22/2021 CLINICAL DATA:  Hyponatremia. History of metastatic head and neck cancer. EXAM: PORTABLE CHEST 1 VIEW COMPARISON:  PET-CT dated Aug 07, 2021. FINDINGS: New right chest wall port catheter with tip in the proximal right atrium. The heart size and mediastinal contours are within normal limits. Both lungs are clear. The visualized skeletal structures are unremarkable. IMPRESSION: No active disease. Electronically Signed   By: WTitus DubinM.D.   On: 10/20/2021 10:30    ECHO 04/2009 Summary:  The EF is estimated at 55-60%.  Abnormal left ventricular diastolic filling is observed, consistent with  impaired LV relaxation(Stage I).  There is no patent foramen ovale visualized with injection of agitated  saline.    Findings:  Proc  The study quality is fair.     Left Ventricle  The left ventricular chamber size is normal.  There is no left  ventricular hypertrophy observed.  There is normal left ventricular  systolic function.  Global left ventricular wall motion and  contractility are within normal limits.  The EF is estimated at 55-60%.  Abnormal left ventricular diastolic filling is observed, consistent with  impaired LV relaxation(Stage I).     Left Atrium  The left atrial chamber size is normal.  There is no patent foramen  ovale visualized.     Right Ventricle  The right ventricular cavity size is normal.  The right ventricular  global systolic function is normal.     Right Atrium  The right atrial cavity size is normal.     Aortic Valve  The aortic valve structure is normal.  There is no evidence of aortic  regurgitation.     Mitral Valve  The mitral valve leaflets appear normal.  There is no evidence of mitral  regurgitation.     Tricuspid Valve  The tricuspid valve leaflets are morphologically normal.  There is no  evidence of tricuspid valve regurgitation.     Pulmonic Valve  The pulmonic valve is not well visualized.  There is no evidence of  pulmonic regurgitation.     Pericardium  The pericardium appears normal.  Aorta  The aortic root appears normal.     Venous  The inferior vena cava appears normal.     Miscellaneous  No evidence of thrombus, intra-cardiac shunting or vegetation.      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  She was apparently unable to take her home medicines due to nausea for several days and also had ongoing diarrhea.  Cardiology is consulted on hospital day 4 due to new onset atrial fibrillation with RVR and medical optimization prior to surgical placement of PEG tube.  #New onset atrial fibrillation with RVR #History of paroxysmal SVT #Hyponatremia, hypokalemia #Nasopharyngeal carcinoma The patient presents with poor p.o. intake and multiple electrolyte disturbances, has been getting IV fluids with rise in her sodium, although potassium remains depleted at 2.9 this afternoon, and magnesium on the low end of normal at 1.7.  She went into atrial fibrillation with RVR around 1350 this afternoon, refractory to IV diltiazem 10 mg x 1 and has been relatively hypotensive. -Recommend monitoring and replating electrolytes to a K >4, Mg >2  -S/p diltiazem 10 mg IV x1.  Continue metoprolol tartrate 12.5 mg twice daily if her blood pressure allows and if she can take p.o.   -Start amiodarone bolus and continuous infusion for hopeful conversion to sinus rhythm for 24 hours, possibly longer if still unable to take p.o. -Continue IV fluids as per nephrology. She does not appear volume overloaded or having any heart failure symptoms at this time -Repeat echocardiogram complete, ideally once heart rate is more controlled -CHA2DS2-VASc 3 (age, sex, HTN), but in the setting of her malignancy and anticipatory need for procedures the risk or bleeding likely outweighs the benefit of starting systemic AC for long term stroke risk reduction   This  patient's plan of care was discussed and created with Dr. Nehemiah Massed and he is in agreement.  Signed: Tristan Schroeder , PA-C 10/13/2021, 3:03 PM [facility] Cardiology  The patient has been interviewed and examined. I agree with assessment and plan above. Serafina Royals MD [facility]

## 2021-10-13 NOTE — Progress Notes (Signed)
[facility]  ROUNDING NOTE   Subjective:   Vickie Caldwell is a 71 y.o. female with past medical history of COPD, GERD, hypertension, chronic hyponatremia and nasopharyngeal cancer. She presents to the ED at the request of the cancer center for abnormal labs. She has been admitted for Hypokalemia [E87.6] Hyponatremia [E87.1]  Patient is known to our practice and is followed by Dr Candiss Norse outpatient. She was last seen in our office on 09/18/21 for continued hyponatremia follow up.  Patient was seen today in ICU. Alert, hard of hearing Poor appetite remains Currently NPO for PEG placement   Objective:  Vital signs in last 24 hours:  Temp:  [97.8 F (36.6 C)] 97.8 F (36.6 C) (07/10 0100) Pulse Rate:  [84-107] 102 (07/10 1000) Resp:  [12-21] 19 (07/10 1000) BP: (117-139)/(62-84) 117/62 (07/10 1000) SpO2:  [95 %-100 %] 97 % (07/10 1000)  Weight change:  Filed Weights   11/02/2021 1004 10/15/2021 1242  Weight: 42.2 kg 43.7 kg    Intake/Output: I/O last 3 completed shifts: In: 3069.1 [P.O.:120; I.V.:2736.3; IV Piggyback:212.8] Out: 2275 [Urine:2275]   Intake/Output this shift:  No intake/output data recorded.  Physical Exam: General: NAD  Head: Normocephalic, atraumatic. Dry oral mucosal membranes  Eyes: Anicteric  Lungs:  Clear to auscultation, normal effort, room air  Heart: Regular rate and rhythm  Abdomen:  Soft, nontender  Extremities:  No peripheral edema.  Neurologic: Nonfocal, moving all four extremities  Skin: No lesions  Access: None    Basic Metabolic Panel: Recent Labs  Lab 10/06/21 1338 10/17/2021 0854 10/19/2021 1301 10/10/21 0507 10/10/21 0903 10/11/21 0722 10/11/21 0915 10/12/21 0427 10/12/21 0923 10/12/21 1307 10/12/21 2147 10/13/21 0424  NA 120* 117*   < > 122*   < > 125*   < > 126* 128* 129* 128* 129*  K 3.2* 2.7*  --  3.8  --  3.6  --  3.1*  --   --   --  2.5*  CL 80* 76*  --  87*  --  90*  --  92*  --   --   --  94*  CO2 27 28   --  27  --  25  --  25  --   --   --  25  GLUCOSE 126* 158*  --  121*  --  111*  --  113*  --   --   --  103*  BUN 9 10  --  <5*  --  <5*  --  <5*  --   --   --  <5*  CREATININE 0.36* 0.42*  --  <0.30*  --  0.31*  --  0.32*  --   --   --  0.32*  CALCIUM 8.6* 8.4*  --  8.0*  --  8.9  --  8.4*  --   --   --  8.4*  MG 1.7 1.7  --   --   --   --   --   --   --   --   --   --    < > = values in this interval not displayed.     Liver Function Tests: Recent Labs  Lab 10/06/21 1338 10/06/2021 0854  AST 25 33  ALT 26 31  ALKPHOS 94 109  BILITOT 0.7 0.6  PROT 6.4* 6.6  ALBUMIN 2.6* 2.6*    No results for input(s): "LIPASE", "AMYLASE" in the last 168 hours. No results for input(s): "AMMONIA" in  the last 168 hours.  CBC: Recent Labs  Lab 10/06/21 1338 10/24/2021 0854 10/10/21 0507 10/11/21 0722 10/12/21 0427  WBC 12.1* 14.1* 15.2* 14.0* 13.0*  NEUTROABS 10.2* 12.4*  --   --   --   HGB 8.8* 9.7* 8.9* 9.8* 8.6*  HCT 26.3* 29.0* 26.8* 30.2* 26.4*  MCV 83.8 82.6 82.5 84.1 83.0  PLT 446* 512* 511* 520* 500*     Cardiac Enzymes: No results for input(s): "CKTOTAL", "CKMB", "CKMBINDEX", "TROPONINI" in the last 168 hours.  BNP: Invalid input(s): "POCBNP"  CBG: No results for input(s): "GLUCAP" in the last 168 hours.  Microbiology: Results for orders placed or performed during the hospital encounter of 10/20/2021  MRSA Next Gen by PCR, Nasal     Status: None   Collection Time: 10/31/2021 12:43 PM   Specimen: Nasal Mucosa; Nasal Swab  Result Value Ref Range Status   MRSA by PCR Next Gen NOT DETECTED NOT DETECTED Final    Comment: (NOTE) The GeneXpert MRSA Assay (FDA approved for NASAL specimens only), is one component of a comprehensive MRSA colonization surveillance program. It is not intended to diagnose MRSA infection nor to guide or monitor treatment for MRSA infections. Test performance is not FDA approved in patients less than 68 years old. Performed at [facility],  [address] [place], [place] [address]     Coagulation Studies: No results for input(s): "LABPROT", "INR" in the last 72 hours.  Urinalysis: No results for input(s): "COLORURINE", "LABSPEC", "PHURINE", "GLUCOSEU", "HGBUR", "BILIRUBINUR", "KETONESUR", "PROTEINUR", "UROBILINOGEN", "NITRITE", "LEUKOCYTESUR" in the last 72 hours.  Invalid input(s): "APPERANCEUR"     Imaging: No results found.   Medications:    sodium chloride Stopped (10/13/21 0556)   sodium chloride 100 mL/hr at 10/13/21 0700    aspirin  81 mg Oral Daily   Chlorhexidine Gluconate Cloth  6 each Topical Daily   enoxaparin (LOVENOX) injection  30 mg Subcutaneous Q24H   fluticasone  1 spray Each Nare Daily   lidocaine-prilocaine  1 Application Topical Once   metoprolol tartrate  12.5 mg Oral BID   montelukast  10 mg Oral Daily   nicotine  7 mg Transdermal Daily   mouth rinse  15 mL Mouth Rinse 4 times per day   pantoprazole (PROTONIX) IV  40 mg Intravenous Daily   sodium chloride flush  3 mL Intravenous Q12H   umeclidinium bromide  1 puff Inhalation Daily   sodium chloride, acetaminophen, albuterol, diazepam, hydrALAZINE, lactulose, morphine CONCENTRATE, ondansetron **OR** ondansetron (ZOFRAN) IV, mouth rinse, sodium chloride flush  Assessment/ Plan:  [name] is a 71 y.o.  female  with past medical history of COPD, GERD, hypertension, chronic hyponatremia and nasopharyngeal cancer. She presents to the ED at the request of the cancer center for abnormal labs. 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 Currently on NS at 111m/hr. May consider changing this to LR in the near future. Considered salt tabs however patient  has poor appetite and dysphagia. Scheduled for PEG placement today, tube feeds will help stabilize electrolytes. Sodium 129 today. Will continue to monitor.    Lab Results  Component Value Date   CREATININE 0.32 (L) 10/13/2021   CREATININE 0.32 (L) 10/12/2021   CREATININE 0.31 (L) 10/11/2021  Intake/Output Summary (Last 24 hours) at 10/13/2021 1145 Last data filed at 10/13/2021 0700 Gross per 24 hour  Intake 2469.85 ml  Output 1425 ml  Net 1044.85 ml    2. Hypokalemia, Potassium 2.5 IV potassium supplements ordered   3. Hypertension, essential.  Blood pressure stable for this patient  4.Anemia of chronic disease Hgb remains decreased, will continue to monitor    LOS: 4 Irene Mitcham 7/10/202311:45 AM

## 2021-10-13 NOTE — Progress Notes (Signed)
SLP Cancellation Note  Patient Details Name: Vickie Caldwell MRN: [id number] DOB: 1951/03/16   Cancelled treatment:       Reason Eval/Treat Not Completed: Patient at procedure or test/unavailable (chart reviewed; consulted NSG) Per NSG, pt is having a PEG placement today. ST services will f/u next 1-2 days. Noted pt is NPO(post midnight last night). Recommend frequent oral care for hygiene and stimulation of swallowing.       Orinda Kenner, MS, CCC-SLP Speech Language Pathologist Rehab Services; [place] [phone] (ascom) [name] 10/13/2021, 10:14 AM

## 2021-10-13 NOTE — Progress Notes (Addendum)
PROGRESS NOTE    [name]  WUJ:[id number] DOB: 06/05/1950  DOA: 10/10/2021 Date of Service: 10/13/21 PCP: Theotis Burrow, MD     Brief Narrative / Hospital Course:  Vickie Caldwell is a 71 y.o. 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 Patient reports being unable to take her medications due to nausea for several days and complains of diarrhea as well. She states that she has a history of hyponatremia and hypokalemia but her numbers were worse today during her follow-up and so she was sent to the ER for further evaluation. 07/06: Sodium 117, K 2.7, Lg 1.6, Started on 3% saline per nephrology recs.  07/07: Nephrology following.  Was on 3% saline, on NS without much improvement, back on 3%.  Oncology including palliative care NP are following.   Family is requesting update regarding oncologic status/prognosis, I deferred detailed discussion to oncology team.  Per palliative notes, family to speak further regarding possible PEG placement for nutrition. MRI obtained to eval possible tumor extension vs CVA as cause for hearing loss -no acute intracranial abnormality, nasopharyngeal carcinoma with large area of abnormal contrast-enhancement extending into prevertebral and parapharyngeal soft tissues and clivus, bilateral mastoid effusions and moderate thickening sphenoid sinuses. 07/08: sodium slowly improving, now 125. WBC trending down to 14 (from 15). SPOke w/ patient I had to write down a lot to communicate w/ her, very HoH. She's ok w/ a feeding tube if needed.  07/09: sodium improving slowly, nephrology following. I can communicate w/ the patient by writing information and questions, she has good understanding of her  situation.  07/10: Sodium continues to improve slowly, potassium repleted this morning.   Later in afternoon, around 2:30 PM, noted elevated heart rate, EKG confirmed A-fib/RVR.  Ordered Cardizem IV push, did not convert and BP dropped.  Consulted cardiology, Dr Erin Fulling. Started amiodarone bolus/infusion IR unable to proceed w/ G-tube d/t airway. Gen Surg consulted and Dr Luther Bradley has evaluated   Consultants:  Nephrology Oncology IR Cardiology  General Surgery  Procedures: none    Subjective: this morning reporting was a bit better from yesterday, mouth/throat persistently hurts. Reexam in afternoon: She's complained about diffuse pain. NO chest pain w/ Afib. Reports fatigue      ASSESSMENT & PLAN:   Principal Problem:   Hyponatremia Active Problems:   Nasopharyngeal carcinoma (HCC)   Benign essential hypertension   Current smoker   Hypokalemia   Hypomagnesemia   Palliative care encounter   Atrial fibrillation with RVR (HCC)   Hyponatremia Thought to be due to excessive fluid intake versus SIADH Patient states that she has cut back on her fluid intake as recommended Sodium level is down to 117 on admission 07/06, associated with weakness and some confusion Nephrology consulted and following closely. 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 Oncology team following while inpatient, including oncology palliative care NP Pt reports difficulty swallowing -->  changed pain meds and GERD tx to IV added IV hydralazine prn HTN based on my discussion w/ patient she is amenable to and is in fact requesting a feeding tube will try to arrange this tomorrow 07/10 (Monday) w/ GI or IR  IR unable to place PEG due to high risk/anatomy, GEN surge consulted but cardiac issues will need to be stabilized before proceeding. NG tube unlikely to pass d/t anatomy abnormal from mass effect of tumor   Hypokalemia Noted to have severe hypokalemia replete this AM, rechecking now that in Afib Continue serial BMP Check magnesium levels  Hypomagnesemia Most likely related to hypokalemia Supplement magnesium Follow levels  Current smoker Smoking cessation discussed with patient in detail nicotine transdermal patch 7 mg daily  Benign essential hypertension Hold home amlodipine Continue metoprolol but at one half has scheduled dose, 50 mg daily, may not be able to take this po and if so will cancel this  Hydralazine prn HTN   DVT prophylaxis: lovenox  Code Status: FULL Family Communication: called brother, got voicemail Disposition Plan / TOC needs: remains inpatient appropriate, anticipate d/c to home w/ HH vs SNF, will ask PT/OT to assess once clinically improved  Barriers to discharge / significant pending items: hyponatremia requiring close monitoring and IV medications, anticipate discharge once sodium is corrected and stays stable, appreciate nephrology input going forward re: sodium, appreciate oncology and palliative input re: GOC. Plan PEG placement              Objective: Vitals:   10/13/21 1400 10/13/21 1415 10/13/21 1430 10/13/21 1445  BP: (!) 88/65 (!) 89/65 (!) 70/56 (!) 83/62  Pulse: (!) 39     Resp: 16     Temp:      TempSrc:      SpO2: 98% 100% 100% 97%  Weight:      Height:         Intake/Output Summary (Last 24 hours) at 10/13/2021 1524 Last data filed at 10/13/2021 1000 Gross per 24 hour  Intake 2489.85 ml  Output 1225 ml  Net 1264.85 ml   Filed Weights   10/08/2021 1004 10/29/2021 1242  Weight: 42.2 kg 43.7 kg    Examination:  Constitutional:  VS as above General Appearance: thin, NAD Respiratory: Normal respiratory effort Breath sounds normal, no wheeze/rhonchi/rales Cardiovascular: S1/S2, rapid HR  No lower extremity edema Gastrointestinal: Nontender, no masses Musculoskeletal:  No clubbing/cyanosis of digits Neurological: No cranial nerve deficit on limited exam Motor and sensation intact and symmetric Psychiatric: Normal judgment/insight Normal mood and affect       Scheduled Medications:   amiodarone  150 mg Intravenous Once   aspirin  81 mg Oral Daily   Chlorhexidine Gluconate Cloth  6 each Topical Daily   enoxaparin (LOVENOX) injection  30 mg Subcutaneous Q24H   fluticasone  1 spray Each Nare Daily   lidocaine-prilocaine  1 Application Topical Once   metoprolol tartrate  2.5 mg Intravenous Q12H   montelukast  10 mg Oral Daily   nicotine  7 mg Transdermal Daily   mouth rinse  15 mL Mouth Rinse 4 times per day   pantoprazole (PROTONIX) IV  40 mg Intravenous Daily   sodium chloride flush  3 mL Intravenous Q12H   umeclidinium bromide  1 puff Inhalation Daily  Continuous Infusions:  sodium chloride Stopped (10/13/21 0556)   sodium chloride 100 mL/hr (10/13/21 1206)   amiodarone     Followed by   amiodarone     potassium chloride      PRN Medications:  sodium chloride, acetaminophen, albuterol, diazepam, hydrALAZINE, lactulose, morphine CONCENTRATE, ondansetron **OR** ondansetron (ZOFRAN) IV, mouth rinse, sodium chloride flush  Antimicrobials:  Anti-infectives (From admission, onward)    Start     Dose/Rate Route Frequency Ordered Stop   10/15/21 0000  vancomycin (VANCOCIN) IVPB 1000 mg/200 mL premix  Status:   Discontinued        1,000 mg 200 mL/hr over 60 Minutes Intravenous To Radiology 10/13/21 1345 10/13/21 1448       Data Reviewed: I have personally reviewed following labs and imaging studies  CBC: Recent Labs  Lab 10/07/2021 0854 10/10/21 0507 10/11/21 0722 10/12/21 0427  WBC 14.1* 15.2* 14.0* 13.0*  NEUTROABS 12.4*  --   --   --   HGB 9.7* 8.9* 9.8* 8.6*  HCT 29.0* 26.8* 30.2* 26.4*  MCV 82.6 82.5 84.1 83.0  PLT 512* 511* 520* 924*   Basic Metabolic Panel: Recent Labs  Lab 10/04/2021 0854 10/10/2021 1301 10/10/21 0507 10/10/21 0903 10/11/21 0722 10/11/21 0915 10/12/21 0427 10/12/21 0923 10/12/21 1307 10/12/21 2147 10/13/21 0424  NA 117*   < > 122*   < > 125*   < > 126* 128* 129* 128* 129*  K 2.7*  --  3.8  --  3.6  --  3.1*  --   --   --  2.5*  CL 76*  --  87*  --  90*  --  92*  --   --   --  94*  CO2 28  --  27  --  25  --  25  --   --   --  25  GLUCOSE 158*  --  121*  --  111*  --  113*  --   --   --  103*  BUN 10  --  <5*  --  <5*  --  <5*  --   --   --  <5*  CREATININE 0.42*  --  <0.30*  --  0.31*  --  0.32*  --   --   --  0.32*  CALCIUM 8.4*  --  8.0*  --  8.9  --  8.4*  --   --   --  8.4*  MG 1.7  --   --   --   --   --   --   --   --   --   --    < > = values in this interval not displayed.   GFR: Estimated Creatinine Clearance: 41.2 mL/min (A) (by C-G formula based on SCr of 0.32 mg/dL (L)). Liver Function Tests: Recent Labs  Lab 10/10/2021 0854  AST 33  ALT 31  ALKPHOS 109  BILITOT 0.6  PROT 6.6  ALBUMIN 2.6*   No results for input(s): "LIPASE", "AMYLASE" in the last 168 hours. No results for input(s): "AMMONIA" in the last 168 hours. Coagulation Profile: No results for input(s): "INR", "PROTIME" in the last 168 hours. Cardiac Enzymes: No results for input(s): "CKTOTAL", "CKMB", "CKMBINDEX", "TROPONINI" in the last 168 hours. BNP (last 3 results) No results for input(s): "PROBNP" in the last 8760 hours. HbA1C: No results for input(s): "HGBA1C"  in the last 72 hours. CBG: No results for input(s): "GLUCAP" in the last 168 hours. Lipid Profile:  No results for input(s): "CHOL", "HDL", "LDLCALC", "TRIG", "CHOLHDL", "LDLDIRECT" in the last 72 hours. Thyroid Function Tests: No results for input(s): "TSH", "T4TOTAL", "FREET4", "T3FREE", "THYROIDAB" in the last 72 hours. Anemia Panel: No results for input(s): "VITAMINB12", "FOLATE", "FERRITIN", "TIBC", "IRON", "RETICCTPCT" in the last 72 hours. Urine analysis:    Component Value Date/Time   COLORURINE STRAW (A) 10/20/2021 1249   APPEARANCEUR CLEAR (A) 10/30/2021 1249   LABSPEC 1.003 (L) 10/20/2021 1249   PHURINE 9.0 (H) 10/13/2021 1249   GLUCOSEU NEGATIVE 10/31/2021 1249   HGBUR NEGATIVE 10/15/2021 1249   BILIRUBINUR NEGATIVE 10/14/2021 1249   KETONESUR 5 (A) 10/21/2021 1249   PROTEINUR NEGATIVE 10/12/2021 1249   NITRITE NEGATIVE 10/26/2021 1249   LEUKOCYTESUR NEGATIVE 10/27/2021 1249   Sepsis Labs: '@LABRCNTIP'$ (procalcitonin:4,lacticidven:4)  Recent Results (from the past 240 hour(s))  MRSA Next Gen by PCR, Nasal     Status: None   Collection Time: 10/30/2021 12:43 PM   Specimen: Nasal Mucosa; Nasal Swab  Result Value Ref Range Status   MRSA by PCR Next Gen NOT DETECTED NOT DETECTED Final    Comment: (NOTE) The GeneXpert MRSA Assay (FDA approved for NASAL specimens only), is one component of a comprehensive MRSA colonization surveillance program. It is not intended to diagnose MRSA infection nor to guide or monitor treatment for MRSA infections. Test performance is not FDA approved in patients less than 5 years old. Performed at [facility], [address], [place], [place] [address]          Radiology Studies last 96 hours: MR BRAIN W WO CONTRAST  Result Date: 10/11/2021 CLINICAL DATA:  Chronic headache EXAM: MRI HEAD WITHOUT AND WITH CONTRAST TECHNIQUE: Multiplanar, multiecho pulse sequences of the brain and surrounding structures were obtained without  and with intravenous contrast. CONTRAST:  71m GADAVIST GADOBUTROL 1 MMOL/ML IV SOLN COMPARISON:  Head CT 08/07/2021 Head CT 06/10/2021 FINDINGS: Brain: No acute infarct, mass effect or extra-axial collection. No acute or chronic hemorrhage. There is multifocal hyperintense T2-weighted signal within the white matter. Generalized volume loss. The midline structures are normal. Vascular: Major flow voids are preserved. Skull and upper cervical spine: Normal calvarium and skull base. Visualized upper cervical spine and soft tissues are normal. Sinuses/Orbits:Bilateral mastoid effusions. Moderate mucosal thickening of the sphenoid sinuses. Normal orbits. There is abnormal contrast enhancement of the nasopharynx and prevertebral soft tissues. Diffusely abnormal bone marrow signal within the clivus, which is likely invaded by adjacent tumor. Abnormal contrast enhancement extends bilaterally into the parapharyngeal spaces. IMPRESSION: 1. No acute intracranial abnormality. 2. No nasopharyngeal carcinoma with large area of abnormal contrast enhancement extending into the prevertebral and parapharyngeal soft tissues as well as invading the clivus. 3. Bilateral mastoid effusions and moderate mucosal thickening of the sphenoid sinuses. Electronically Signed   By: KUlyses JarredM.D.   On: 10/11/2021 02:05            LOS: 4 days       NEmeterio Reeve DO [facility] 10/13/2021, 3:24 PM   Staff may message me via secure chat in [place] but this may not receive immediate response,  please page for urgent matters!  If 7PM-7AM, please contact night-coverage [email or web address]  Dictation software was used to generate the above note. Typos may occur and escape review, as with typed/written notes. Please contact Dr ASheppard Coildirectly for clarity if needed.

## 2021-10-13 NOTE — Progress Notes (Addendum)
1434 Patients heart rate 160s. Dr. Sheppard Coil paged. EKG done. EKG show Afib. RVR . 1442 Cardizem 10 mg IVP - no results.  B/P dropped. Patient alert and awake. Cardiology consulted. L.Tang PA responded to consult. 1531 Amiodorone 150 mg bolus IV given per orders. 1600 Amiodorone drip started at 60 mg/hour per orders Patient remains alert and awake and B/P slowly improving as heart rate decreases. 1630 B/P improving even though heart rate is still in 160s.

## 2021-10-13 NOTE — Progress Notes (Incomplete)
Neomia Glass, NP notified of lab results, sodium 126 and potassium 3.4, acknowledged. No new orders received at this time.

## 2021-10-13 NOTE — Consult Note (Signed)
[place] for 3% NaCl monitoring  Indication: Acute on chronic hyponatremia  Patient Measurements: Height: '4\' 8"'$  (142.2 cm) Weight: 43.7 kg (96 lb 5.5 oz) IBW/kg (Calculated) : 36.3  Intake/Output from previous day: 07/09 0701 - 07/10 0700 In: 2285.9 [I.V.:2242.9; IV Piggyback:43] Out: 1525 [IONGE:9528] Intake/Output from this shift: Total I/O In: 1032.9 [I.V.:1032.9] Out: 875 [Urine:875]  Labs: Recent Labs    10/11/21 0722 10/12/21 0427 10/13/21 0424  WBC 14.0* 13.0*  --   HGB 9.8* 8.6*  --   HCT 30.2* 26.4*  --   PLT 520* 500*  --   CREATININE 0.31* 0.32* 0.32*    Estimated Creatinine Clearance: 41.2 mL/min (A) (by C-G formula based on SCr of 0.32 mg/dL (L)).  Medical History: Past Medical History:  Diagnosis Date   Asthma    COPD (chronic obstructive pulmonary disease) (HCC)    Dysrhythmia    GERD (gastroesophageal reflux disease)    History of kidney stones    Hypertension    Osteoarthritis    TIA (transient ischemic attack) 2011   No Deficits   Vertigo    Infusions:   sodium chloride 10 mL/hr at 10/13/21 0500   sodium chloride 100 mL/hr at 10/13/21 0500   potassium chloride     Assessment: Pharmacy has been consulted to assist and monitor hypertonic saline infusion in 71yo patient who was sent to the ER from the cancer center for evaluation of loss of vision, hearing, taste, and difficulty swallowing. Patient has a history of hyponatremia and hypokalemia but lab values were worse today during follow-up visit. Potassium level of 2.7, Magnesium 1.6 and sodium of 117. Thought to initially be d/t excessive fluid intake vs. SIADH, although she states she has cut back on her fluid intake as recommended. Primary team discussed with nephrology, who recommended starting patient on 3% saline.  Date/Time Na Rate 7/6 0854 117      N/A, admission value 7/6 1159 N/A 3% NaCl started at 30 ml/hr 7/6 1301 124 3% NaCl at 30  ml/hr 7/6 1400 N/A 3% NaCl discontinued 7/6 1505 122      N/A 7/6 1600 N/A 0.9% NaCl started at 50 ml/hr 7/6 2034 120      0.9% NaCl at 50 ml/hr 7/7 0116 121      0.9% NaCl at 50 ml/hr 7/7 0507 122      0.9% NaCl at 50 ml/hr 7/7 0903 120 0.9% NaCl at 50 ml/hr 7/7 1241 N/A 3% NaCl started at 20 ml/hr 7/7 1330 120 3% NaCl at 20 ml/hr 7/7 2200          121     3% NaCl at 20 ml/hr 7/8 0202          122     3% NaCl at 20 ml/hr 7/8 0722  125 3% NaCl at 20 ml/hr 7/8 1329 125 3% NaCl at 10 ml/hr 7/8 1704          127     3% NaCl at 10 ml/hr 7/9 0053          124     3% NaCl stopped 7/9 0427          126     0.9% NaCl at 100 ml/hr 7/9 0923  128 0.9% NaCl at 100 ml/hr 7/9 1307  129 0.9% NaCl at 135m/hr 7/10 0424        129      0.9% NaCl at 100 ml/hr  Goal of Therapy:  Contact provider if Na increases > 4 mmol/L over 2 hours or > 6 mmol/L over 4 hours Increase Na by 8 - 12 mmol / L / day  Plan:  --Continue NS'@100ml'$ /hr --Fluid restriction: 1.2L --Sodium checks q4h --Continue to monitor plan per nephrology  Arren Laminack D 10/13/2021 5:50 AM

## 2021-10-14 ENCOUNTER — Ambulatory Visit: Payer: Medicare HMO

## 2021-10-14 ENCOUNTER — Inpatient Hospital Stay
Admit: 2021-10-14 | Discharge: 2021-10-14 | Disposition: A | Discharge status: Home / Self Care | Payer: Medicare HMO | Attending: Cardiology | Admitting: Cardiology

## 2021-10-14 ENCOUNTER — Encounter: Payer: Self-pay | Admitting: Anesthesiology

## 2021-10-14 DIAGNOSIS — E871 Hypo-osmolality and hyponatremia: Secondary | ICD-10-CM | POA: Diagnosis not present

## 2021-10-14 LAB — SODIUM
Sodium: 128 mmol/L — ABNORMAL LOW (ref 135–145)
Sodium: 127 mmol/L — ABNORMAL LOW (ref 135–145)
Sodium: 127 mmol/L — ABNORMAL LOW (ref 135–145)

## 2021-10-14 LAB — ECHOCARDIOGRAM COMPLETE
AR max vel: 2.47 cm2
AV Area VTI: 3 cm2
AV Area mean vel: 2.27 cm2
AV Mean grad: 2 mmHg
AV Peak grad: 4.2 mmHg
Ao pk vel: 1.03 m/s
Area-P 1/2: 4.33 cm2
Height: 56 in
S' Lateral: 1.9 cm
Weight: 1541.46 oz

## 2021-10-14 LAB — BASIC METABOLIC PANEL
Anion gap: 11 (ref 5–15)
BUN: <5 mg/dL — ABNORMAL LOW (ref 8–23)
CO2: 24 mmol/L (ref 22–32)
Calcium: 8.1 mg/dL — ABNORMAL LOW (ref 8.9–10.3)
Chloride: 94 mmol/L — ABNORMAL LOW (ref 98–111)
Creatinine, Ser: <0.3 mg/dL — ABNORMAL LOW (ref 0.44–1.00)
Glucose, Bld: 120 mg/dL — ABNORMAL HIGH (ref 70–99)
Potassium: 2.5 mmol/L — CL (ref 3.5–5.1)
Sodium: 129 mmol/L — ABNORMAL LOW (ref 135–145)

## 2021-10-14 LAB — MAGNESIUM: Magnesium: 2 mg/dL (ref 1.7–2.4)

## 2021-10-14 LAB — POTASSIUM
Potassium: 3.4 mmol/L — ABNORMAL LOW (ref 3.5–5.1)
Potassium: 2.7 mmol/L — CL (ref 3.5–5.1)

## 2021-10-14 MED ORDER — LACTATED RINGERS IV SOLN: INTRAVENOUS | Status: DC

## 2021-10-14 MED ORDER — POTASSIUM CHLORIDE 10 MEQ/100ML IV SOLN
10.0000 meq | INTRAVENOUS | Status: AC
  Administered 2021-10-14 (×2): 10 meq via INTRAVENOUS
  Filled 2021-10-14 (×2): qty 100

## 2021-10-14 MED ORDER — POTASSIUM CHLORIDE 10 MEQ/100ML IV SOLN
10.0000 meq | INTRAVENOUS | Status: AC
  Administered 2021-10-14 (×4): 10 meq via INTRAVENOUS
  Filled 2021-10-14 (×4): qty 100

## 2021-10-14 MED ORDER — POTASSIUM CHLORIDE 2 MEQ/ML IV SOLN
INTRAVENOUS | Status: DC
  Filled 2021-10-14 (×8): qty 1000

## 2021-10-14 NOTE — Progress Notes (Signed)
1500 Report received. [place] Dr Tami Ribas   ENT in to see patient. Bedside laryngoscopy preformed. Cleared for peg placement.

## 2021-10-14 NOTE — Progress Notes (Addendum)
[place] NOTE       Patient ID: Vickie Caldwell MRN: [id number] DOB/AGE: [birth year] 70 y.o.  Admit date: 10/14/2021 Referring Physician Dr. 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  She was apparently unable to take her home medicines due to nausea for several days and also had ongoing diarrhea.  Cardiology is consulted on hospital day 4 due to new onset atrial fibrillation with RVR and medical optimization prior to surgical placement of PEG tube.  Interval History:  - converted to sinus rhythm overnight - electrolyte derangements remain, persistently severely hypokalemia from 3.4 - 2.5 this morning again - echo performed this morning - remains NPO   Past Medical History:  Diagnosis Date   Asthma    COPD (chronic obstructive pulmonary disease) (HCC)    Dysrhythmia    GERD (gastroesophageal reflux disease)    History of kidney stones    Hypertension    Osteoarthritis    TIA (transient ischemic attack) 2011   No Deficits   Vertigo     Past Surgical History:  Procedure Laterality Date   ABDOMINAL HYSTERECTOMY     BREAST BIOPSY Left 1980's   neg. Pt states punch biopsy at the nipple   IR IMAGING GUIDED PORT INSERTION  08/14/2021   LYMPH NODE DISSECTION Right    neck   MANDIBLE SURGERY     Sagittal split   NASAL ENDOSCOPY Bilateral 07/23/2021   Procedure: NASAL ENDOSCOPY WITH BIOPSY OF NASOPHARYNGEAL MASS;  Surgeon: Clyde Canterbury, MD;  Location: [facility] ORS;  Service: ENT;  Laterality: Bilateral;    THROAT SURGERY     Vocal cord polyps "burned"   TUBAL LIGATION     WISDOM TOOTH EXTRACTION      Medications Prior to Admission  Medication Sig Dispense Refill Last Dose   amLODipine (NORVASC) 10 MG tablet Take 10 mg by mouth daily.   Past Week   diazepam (VALIUM) 5 MG tablet Take 1 tablet (5 mg total) by mouth every 12 (twelve) hours as needed. 60 tablet 0 Past Week   fluticasone (FLONASE) 50 MCG/ACT nasal spray Place into both nostrils daily.   Past Week   metoprolol succinate (TOPROL-XL) 100 MG 24 hr tablet Take 100 mg by mouth daily. Take with or immediately following a meal.   Past Week   montelukast (SINGULAIR) 10 MG tablet Take 10 mg by mouth daily.   Past Week   morphine (MS CONTIN) 30 MG 12 hr tablet Take 1 tablet (30 mg total) by mouth every 12 (twelve) hours. 60 tablet 0 Past Week   Oxycodone HCl 10 MG TABS Take 1 tablet (10 mg total) by mouth every 4 (four) hours as needed. 84 tablet 0 Past Week   potassium chloride 20 MEQ/15ML (10%) SOLN Take 15 mLs (20 mEq total) by mouth 3 (three) times daily. 473 mL 2 Past Week   umeclidinium bromide (INCRUSE ELLIPTA) 62.5 MCG/ACT AEPB Inhale 1 puff into the lungs daily.   Past Week   acetaminophen (TYLENOL) 325 MG tablet Take 650 mg by mouth every 6 (six) hours as needed.   prn at  prn   albuterol (VENTOLIN HFA) 108 (90 Base) MCG/ACT inhaler Inhale into the lungs every 6 (six) hours as needed for wheezing or shortness of breath.   prn at prn   ASPIRIN 81 PO Take by mouth daily.      Cholecalciferol (VITAMIN D3 PO) Take by mouth daily.      famotidine (PEPCID) 10 MG tablet Take 20 mg by mouth daily.      HYDROcodone-acetaminophen (NORCO) 10-325 MG tablet Take 1 tablet by mouth every 6 (six) hours as needed. 45 tablet 0 prn at prn   lactulose (CHRONULAC) 10 GM/15ML solution Take 15 mLs (10 g total) by mouth 2 (two) times daily as needed for moderate constipation. 236 mL 0 prn at prn   lidocaine-prilocaine (EMLA) cream Apply on the port. 30 -45 min   prior to port access. 30 g 3 prn at prn   ondansetron (ZOFRAN) 8 MG tablet One pill every 8 hours as needed for nausea/vomitting. 40 tablet 1 prn at prn   potassium chloride SA (KLOR-CON M) 20 MEQ tablet 1 pill twice a day (Patient not taking: Reported on 10/26/2021) 60 tablet 3 Not Taking   promethazine (PHENERGAN) 25 MG tablet Take 0.5 tablets (12.5 mg total) by mouth every 8 (eight) hours as needed for refractory nausea / vomiting. 30 tablet 0 prn at prn   Social History   Socioeconomic History   Marital status: Single    Spouse name: Not on file   Number of children: Not on file   Years of education: Not on file   Highest education level: Not on file  Occupational History   Not on file  Tobacco Use   Smoking status: Every Day    Packs/day: 1.00    Years: 51.00    Total pack years: 51.00    Types: Cigarettes   Smokeless tobacco: Never   Tobacco comments:    Started smoking around age 25  Vaping Use   Vaping Use: Never used  Substance and Sexual Activity   Alcohol use: Yes    Alcohol/week: 1.0 standard drink of alcohol    Types: 1 Cans of beer per week    Comment: occasional   Drug use: Never   Sexual activity: Not Currently    Birth control/protection: None  Other Topics Concern   Not on file  Social History Narrative   Smoker; sometimes beer/wine; used to work for Therapist, 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 Lives in [place]. With daughter-ZES//brother. From [place].    Social Determinants of Health   Financial Resource Strain: Not on file  Food Insecurity: Not on file  Transportation Needs: No Transportation Needs (10/02/2021)   PRAPARE - Hydrologist (Medical): No    Lack of Transportation (Non-Medical): No  Physical Activity: Not on file  Stress: Not on file  Social Connections: Not on file  Intimate Partner Violence: Not on file    Family History  Problem Relation Age of Onset   Coronary artery disease Mother    Cancer Father 51        Oral   Cancer Brother 50       testicular   Prostate cancer Brother    Heart failure Brother    Breast cancer Neg Hx       PHYSICAL EXAM General: Elderly and cachectic Caucasian female in no acute distress.  Sitting upright in ICU bed HEENT:  Normocephalic and atraumatic.  Very hard of hearing, hoarse voice Neck:  No  JVD.  Chest: Right-sided port Lungs: Normal respiratory effort on room air. Clear bilaterally to auscultation. No wheezes, crackles, rhonchi.  Heart: Regular rate and rhythm. Normal S1 and S2 without gallops or murmurs. Radial & DP pulses 2+ bilaterally. Abdomen: Non-distended appearing.  Msk: Normal strength and tone for age. Extremities: Warm and well perfused. No clubbing, cyanosis.  No peripheral edema.  Neuro: Alert and oriented X 3. Psych:  Answers questions appropriately.   Labs:   Lab Results  Component Value Date   WBC 14.2 (H) 10/13/2021   HGB 8.2 (L) 10/13/2021   HCT 25.8 (L) 10/13/2021   MCV 83.8 10/13/2021   PLT 375 10/13/2021    Recent Labs  Lab 11/02/2021 0854 10/26/2021 1301 10/14/21 0434  NA 117*   < > 129*  128*  K 2.7*   < > 2.5*  CL 76*   < > 94*  CO2 28   < > 24  BUN 10   < > <5*  CREATININE 0.42*   < > <0.30*  CALCIUM 8.4*   < > 8.1*  PROT 6.6  --   --   BILITOT 0.6  --   --   ALKPHOS 109  --   --   ALT 31  --   --   AST 33  --   --   GLUCOSE 158*   < > 120*   < > = values in this interval not displayed.    No results found for: "CKTOTAL", "CKMB", "CKMBINDEX", "TROPONINI" No results found for: "CHOL" No results found for: "HDL" No results found for: "LDLCALC" No results found for: "TRIG" No results found for: "CHOLHDL" No results found for: "LDLDIRECT"    Radiology: MR BRAIN W WO CONTRAST  Result Date: 10/11/2021 CLINICAL DATA:  Chronic headache EXAM: MRI HEAD WITHOUT AND WITH CONTRAST TECHNIQUE: Multiplanar, multiecho pulse sequences of the brain and surrounding structures were obtained without and with intravenous  contrast. CONTRAST:  36m GADAVIST GADOBUTROL 1 MMOL/ML IV SOLN COMPARISON:  Head CT 08/07/2021 Head CT 06/10/2021 FINDINGS: Brain: No acute infarct, mass effect or extra-axial collection. No acute or chronic hemorrhage. There is multifocal hyperintense T2-weighted signal within the white matter. Generalized volume loss. The midline structures are normal. Vascular: Major flow voids are preserved. Skull and upper cervical spine: Normal calvarium and skull base. Visualized upper cervical spine and soft tissues are normal. Sinuses/Orbits:Bilateral mastoid effusions. Moderate mucosal thickening of the sphenoid sinuses. Normal orbits. There is abnormal contrast enhancement of the nasopharynx and prevertebral soft tissues. Diffusely abnormal bone marrow signal within the clivus, which is likely invaded by adjacent tumor. Abnormal contrast enhancement extends bilaterally into the parapharyngeal spaces. IMPRESSION: 1. No acute intracranial abnormality. 2. No nasopharyngeal carcinoma with large area of abnormal contrast enhancement extending into the prevertebral and parapharyngeal soft tissues as well as invading the clivus. 3. Bilateral mastoid effusions and moderate mucosal thickening of the sphenoid sinuses. Electronically Signed   By: KUlyses JarredM.D.   On: 10/11/2021 02:05   DG Chest Portable 1 View  Result Date: 10/04/2021 CLINICAL DATA:  Hyponatremia. History of metastatic head and neck cancer. EXAM: PORTABLE CHEST 1 VIEW COMPARISON:  PET-CT dated Aug 07, 2021. FINDINGS: New right chest wall port catheter with tip in the proximal right atrium. The heart size and mediastinal contours are within normal limits. Both lungs are clear. The visualized skeletal structures are unremarkable. IMPRESSION: No active disease. Electronically Signed   By: WTitus DubinM.D.   On: 10/29/2021 10:30  ECHO 04/2009 Summary:  The EF is estimated at 55-60%.  Abnormal left ventricular diastolic filling is observed, consistent  with  impaired LV relaxation(Stage I).  There is no patent foramen ovale visualized with injection of agitated  saline.   Findings:  Proc  The study quality is fair.     Left Ventricle  The left ventricular chamber size is normal.  There is no left  ventricular hypertrophy observed.  There is normal left ventricular  systolic function.  Global left ventricular wall motion and  contractility are within normal limits.  The EF is estimated at 55-60%.  Abnormal left ventricular diastolic filling is observed, consistent with  impaired LV relaxation(Stage I).     Left Atrium  The left atrial chamber size is normal.  There is no patent foramen  ovale visualized.     Right Ventricle  The right ventricular cavity size is normal.  The right ventricular  global systolic function is normal.     Right Atrium  The right atrial cavity size is normal.     Aortic Valve  The aortic valve structure is normal.  There is no evidence of aortic  regurgitation.     Mitral Valve  The mitral valve leaflets appear normal.  There is no evidence of mitral  regurgitation.     Tricuspid Valve  The tricuspid valve leaflets are morphologically normal.  There is no  evidence of tricuspid valve regurgitation.     Pulmonic Valve  The pulmonic valve is not well visualized.  There is no evidence of  pulmonic regurgitation.     Pericardium  The pericardium appears normal.     Aorta  The aortic root appears normal.     Venous  The inferior vena cava appears normal.     Miscellaneous  No evidence of thrombus, intra-cardiac shunting or vegetation.      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  She was apparently unable to take her home medicines due to nausea for several days and also had ongoing diarrhea.  Cardiology is consulted on hospital day 4 due to new onset atrial fibrillation with RVR and medical optimization prior to surgical placement of PEG tube.  #New onset atrial fibrillation with RVR, converted to NSR on 7/10 #History of paroxysmal SVT #Hyponatremia, hypokalemia #Nasopharyngeal carcinoma The patient presents with poor p.o. intake and multiple electrolyte disturbances, has been getting IV fluids with rise in her sodium, although potassium remains depleted at 2.9 this afternoon, and magnesium on the low end of normal at 1.7.  She went into atrial fibrillation with RVR around 1350 on 7/10, refractory to IV diltiazem 10 mg x 1 and has been relatively hypotensive. Converted to NSR around 1700 on 7/10.  -Recommend monitoring and replating electrolytes  to a K >4, Mg >2  -S/p diltiazem 10 mg IV x1.  Continue metoprolol tartrate 12.5 mg twice daily if her blood pressure allows and if she can take p.o. Can give metoprolol tartrate IV 2.5 BID as an alternative.  -s/p amiodarone bolus, continue infusion for now as she is not taking PO  -Continue IV fluids as per nephrology. She does not appear volume overloaded or having any heart failure symptoms at this time -echocardiogram complete performed this morning, pending read -CHA2DS2-VASc 3 (age, sex, HTN), but in the setting of her malignancy and  anticipatory need for procedures the risk or bleeding likely outweighs the benefit of starting systemic AC for long term stroke risk reduction  - echo pending read.   This patient's plan of care was discussed and created with Dr. Nehemiah Massed and he is in agreement.  Signed: Tristan Schroeder , PA-C 10/14/2021, 10:00 AM [facility]  The patient has been interviewed and examined. I agree with assessment and plan above. Serafina Royals MD [facility]

## 2021-10-14 NOTE — Consult Note (Signed)
Vickie Caldwell, Vickie Caldwell [id number] [birth year] Emeterio Reeve, DO  Reason for Consult: Asked for airway evaluation prior to placement of G-tube.  HPI: Patient was diagnosed with nasopharyngeal carcinoma in April of this year by Dr. Richardson Landry in my practice.  This was based on a biopsy in the nasopharynx performed under general anesthesia.  In speaking with Dr. Richardson Landry there was no issue with intubation at the time of that biopsy.  Patient is also had difficulty hearing and swallowing more recently.  The swallowing has become such that she is now requiring placement of a G-tube.  ENT was asked to evaluate the airway following chemoradiation and cancer treatment.  Allergies:  Allergies  Allergen Reactions   Codeine Nausea And Vomiting   Penicillins Rash    ROS: Review of systems normal other than 12 systems except per HPI.  PMH:  Past Medical History:  Diagnosis Date   Asthma    COPD (chronic obstructive pulmonary disease) (HCC)    Dysrhythmia    GERD (gastroesophageal reflux disease)    History of kidney stones    Hypertension    Osteoarthritis    TIA (transient ischemic attack) 2011   No Deficits   Vertigo     FH:  Family History  Problem Relation Age of Onset   Coronary artery disease Mother    Cancer Father 46       Oral   Cancer Brother 7       testicular   Prostate cancer Brother    Heart failure Brother    Breast cancer Neg Hx     SH:  Social History   Socioeconomic History   Marital status: Single    Spouse name: Not on file   Number of children: Not on file   Years of education: Not on file   Highest education level: Not on file  Occupational History   Not on file  Tobacco Use   Smoking status: Every Day    Packs/day: 1.00    Years: 51.00    Total pack years: 51.00    Types: Cigarettes   Smokeless tobacco: Never   Tobacco comments:    Started smoking around age 78  Vaping Use   Vaping Use: Never used  Substance and Sexual Activity   Alcohol use: Yes     Alcohol/week: 1.0 standard drink of alcohol    Types: 1 Cans of beer per week    Comment: occasional   Drug use: Never   Sexual activity: Not Currently    Birth control/protection: None  Other Topics Concern   Not on file  Social History Narrative   Smoker; sometimes beer/wine; used to work for Therapist, 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 With daughter-ZES//brother. From [place].    Social Determinants of Health   Financial Resource Strain: Not on file  Food Insecurity: Not on file  Transportation Needs: No Transportation Needs (10/02/2021)   PRAPARE - Hydrologist (Medical): No    Lack of Transportation (Non-Medical): No  Physical Activity: Not on file  Stress: Not on file  Social Connections: Not on file  Intimate Partner Violence: Not on file    PSH:  Past Surgical History:  Procedure Laterality Date   ABDOMINAL HYSTERECTOMY     BREAST BIOPSY Left 1980's   neg. Pt states punch biopsy at the nipple   IR IMAGING GUIDED PORT INSERTION  08/14/2021   LYMPH NODE DISSECTION Right    neck   MANDIBLE SURGERY  Sagittal split   NASAL ENDOSCOPY Bilateral 07/23/2021   Procedure: NASAL ENDOSCOPY WITH BIOPSY OF NASOPHARYNGEAL MASS;  Surgeon: Clyde Canterbury, MD;  Location: [facility] ORS;  Service: ENT;  Laterality: Bilateral;   THROAT SURGERY     Vocal cord polyps "burned"   TUBAL LIGATION     WISDOM TOOTH EXTRACTION      Physical  Exam: Patient is awake and alert, difficult to communicate with due to hearing difficulties.  There is no audible stridor noted.  The external ears appear normal the anterior nose was dry and crusty the oral cavity oropharynx showed moderate to severe mucositis involving the hard and soft palate and buccal mucosa, but no evidence of obstructing lesion.  After her consent at the bedside and with her grandson present, a flexible fiberoptic laryngoscope was passed through each nostril.  There was thickened crusty  material in the nasopharynx consistent with post XRT and chemo treatment for nasopharyngeal carcinoma.  Going to pass this debris into the posterior pharynx hypopharynx and larynx showed the tongue base to be normal, the epiglottis was normal, she had good vocal cord mobility.  There was no evidence of airway obstruction, the immediate subglottis appeared normal.   A/P: Nasopharyngeal cancer with severe oral mucositis likely the source of her severe sore throat and swallowing difficulty.  Besides the of the debris in her nasopharynx the oropharynx and larynx did not have any evidence of obstruction.  Should she require oral intubation I do not think this would be difficult.  I have reviewed her MRI she has bilateral mastoid effusions, this could be contributing some to her hearing loss, I spoke with the grandson about organizing an appointment in the office to better assess her hearing when she is able to leave the hospital.  He will speak with Dr. Rogue Bussing about this upon her discharge.  She can follow-up with Dr. Richardson Landry as an outpatient for further follow-up of the nasopharyngeal cancer and her hearing loss.  ICU time approximately 1 hour   Roena Malady 10/14/2021 5:23 PM

## 2021-10-14 NOTE — Progress Notes (Addendum)
[facility] for Electrolyte Monitoring and Replacement   Recent Labs: Potassium (mmol/L)  Date Value  10/14/2021 2.5 (LL)   Magnesium (mg/dL)  Date Value  10/14/2021 2.0   Calcium (mg/dL)  Date Value  10/14/2021 8.1 (L)   Albumin (g/dL)  Date Value  10/12/2021 2.6 (L)   Sodium (mmol/L)  Date Value  10/14/2021 128 (L)  10/14/2021 129 (L)   Assessment: 71 y.o. 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 Now with new onset atrial fibrillation with RVR   Goal of Therapy:  Potassium 4.0 - 5.1 mmol/L Magnesium 2.0 - 2.4 mg/dL All Other Electrolytes WNL  Plan:  K: 2.5>3.4 after 10 mEq IV KCl x 4 Give additional 10 mEq IV KCl x 2 Next levels with AM labs  Lorna Dibble, PharmD, [facility] Clinical Pharmacist 10/14/2021 5:32 PM

## 2021-10-14 NOTE — Progress Notes (Signed)
*  PRELIMINARY RESULTS* Echocardiogram 2D Echocardiogram has been performed.  Vickie Caldwell 10/14/2021, 8:57 AM

## 2021-10-14 NOTE — Progress Notes (Signed)
[facility]  ROUNDING NOTE   Subjective:   Vickie Caldwell is a 71 y.o. female with past medical history of COPD, GERD, hypertension, chronic hyponatremia and nasopharyngeal cancer. She presents to the ED at the request of the cancer center for abnormal labs. She has been admitted for Hypokalemia [E87.6] Hyponatremia [E87.1]  Patient is known to our practice and is followed by Dr Candiss Norse outpatient. She was last seen in our office on 09/18/21 for continued hyponatremia follow up.  Patient was seen today in ICU. Patient currently sitting up in bed Nursing at bedside assisting with oral care No lower extremity edema Patient remains n.p.o.  Sodium 129 IV fluids-normal saline at 125 mL/h Urine output 2.3 L in 24 hours Potassium 2.5  Objective:  Vital signs in last 24 hours:  Temp:  [97.8 F (36.6 C)-98.2 F (36.8 C)] 98.1 F (36.7 C) (07/11 0700) Pulse Rate:  [39-158] 101 (07/11 1100) Resp:  [9-21] 20 (07/11 1100) BP: (70-144)/(56-84) 133/73 (07/11 1100) SpO2:  [95 %-100 %] 96 % (07/11 1100)  Weight change:  Filed Weights   10/22/2021 1004 11/02/2021 1242  Weight: 42.2 kg 43.7 kg    Intake/Output: I/O last 3 completed shifts: In: 4528.7 [I.V.:3927.8; IV Piggyback:600.9] Out: 3154 [Urine:3525]   Intake/Output this shift:  Total I/O In: 832.9 [I.V.:632.8; IV Piggyback:200.1] Out: -   Physical Exam: General: NAD  Head: Normocephalic, atraumatic. Dry oral mucosal membranes  Eyes: Anicteric  Lungs:  Clear to auscultation, normal effort, room air  Heart: Regular rate and rhythm  Abdomen:  Soft, nontender  Extremities:  No peripheral edema.  Neurologic: Nonfocal, moving all four extremities  Skin: No lesions  Access: None    Basic Metabolic Panel: Recent Labs  Lab 10/13/2021 0854 11/01/2021 1301 10/12/21 0427 10/12/21 0923 10/12/21 2147 10/13/21 0424 10/13/21 1506 10/13/21 2248 10/14/21 0434  NA 117*   < > 126*   < > 128* 129* 128* 126* 129*  128*   K 2.7*   < > 3.1*  --   --  2.5* 2.9* 3.4* 2.5*  CL 76*   < > 92*  --   --  94* 93* 90* 94*  CO2 28   < > 25  --   --  '25 22 24 24  '$ GLUCOSE 158*   < > 113*  --   --  103* 98 125* 120*  BUN 10   < > <5*  --   --  <5* <5* <5* <5*  CREATININE 0.42*   < > 0.32*  --   --  0.32* 0.40* <0.30* <0.30*  CALCIUM 8.4*   < > 8.4*  --   --  8.4* 8.3* 7.8* 8.1*  MG 1.7  --   --   --   --   --  1.7  --  2.0   < > = values in this interval not displayed.     Liver Function Tests: Recent Labs  Lab 11/02/2021 0854  AST 33  ALT 31  ALKPHOS 109  BILITOT 0.6  PROT 6.6  ALBUMIN 2.6*    No results for input(s): "LIPASE", "AMYLASE" in the last 168 hours. No results for input(s): "AMMONIA" in the last 168 hours.  CBC: Recent Labs  Lab 10/15/2021 0854 10/10/21 0507 10/11/21 0722 10/12/21 0427 10/13/21 2248  WBC 14.1* 15.2* 14.0* 13.0* 14.2*  NEUTROABS 12.4*  --   --   --   --   HGB 9.7* 8.9* 9.8* 8.6* 8.2*  HCT 29.0*  26.8* 30.2* 26.4* 25.8*  MCV 82.6 82.5 84.1 83.0 83.8  PLT 512* 511* 520* 500* 375     Cardiac Enzymes: No results for input(s): "CKTOTAL", "CKMB", "CKMBINDEX", "TROPONINI" in the last 168 hours.  BNP: Invalid input(s): "POCBNP"  CBG: No results for input(s): "GLUCAP" in the last 168 hours.  Microbiology: Results for orders placed or performed during the hospital encounter of 10/06/2021  MRSA Next Gen by PCR, Nasal     Status: None   Collection Time: 10/31/2021 12:43 PM   Specimen: Nasal Mucosa; Nasal Swab  Result Value Ref Range Status   MRSA by PCR Next Gen NOT DETECTED NOT DETECTED Final    Comment: (NOTE) The GeneXpert MRSA Assay (FDA approved for NASAL specimens only), is one component of a comprehensive MRSA colonization surveillance program. It is not intended to diagnose MRSA infection nor to guide or monitor treatment for MRSA infections. Test performance is not FDA approved in patients less than 67 years old. Performed at [facility], [address] [place], [place] [address]     Coagulation Studies: No results for input(s): "LABPROT", "INR" in the last 72 hours.  Urinalysis: No results for input(s): "COLORURINE", "LABSPEC", "PHURINE", "GLUCOSEU", "HGBUR", "BILIRUBINUR", "KETONESUR", "PROTEINUR", "UROBILINOGEN", "NITRITE", "LEUKOCYTESUR" in the last 72 hours.  Invalid input(s): "APPERANCEUR"     Imaging: No results found.   Medications:    sodium chloride Stopped (10/14/21 0901)   amiodarone 30 mg/hr (10/14/21 1158)   lactated ringers 100 mL/hr at 10/14/21 1158   potassium chloride 10 mEq (10/14/21 1200)    aspirin  81 mg Oral Daily   Chlorhexidine Gluconate Cloth  6 each Topical Daily   enoxaparin (LOVENOX) injection  30 mg Subcutaneous Q24H   fluticasone  1 spray Each Nare Daily   lidocaine-prilocaine  1 Application Topical Once   metoprolol tartrate  2.5 mg Intravenous Q12H   montelukast  10 mg Oral Daily   nicotine  7 mg Transdermal Daily   mouth rinse  15 mL Mouth Rinse 4 times per day   pantoprazole (PROTONIX) IV  40 mg Intravenous Daily   sodium chloride flush  3 mL Intravenous Q12H   umeclidinium bromide  1 puff Inhalation Daily   sodium chloride, acetaminophen, albuterol, diazepam, hydrALAZINE, lactulose, morphine CONCENTRATE, ondansetron **OR** ondansetron (ZOFRAN) IV, mouth rinse, sodium chloride flush  Assessment/ Plan:  Vickie Caldwell is a 71 y.o.  female  with past medical history of COPD, GERD, hypertension, chronic hyponatremia and nasopharyngeal cancer. She presents to the ED at the request of the cancer center for abnormal labs. 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  Currently on IV fluids at 125.  We will continue IV fluids  considering lack of oral intake.  We will continue to monitor.  Continue fluid restriction.  PICC placement currently held due to airway concerns.   Lab Results  Component Value Date   CREATININE <0.30 (L) 10/14/2021   CREATININE <0.30 (L) 10/13/2021   CREATININE 0.40 (L) 10/13/2021    Intake/Output Summary (Last 24 hours) at 10/14/2021 1201 Last data filed at 10/14/2021 1158 Gross per 24 hour  Intake 3998.46 ml  Output 2300 ml  Net 1698.46 ml    2. Hypokalemia, Potassium corrected to 3.4  yesterday with supplementation.  Potassium back to 2.5 today. IV potassium supplements ordered.  We will change IV fluids to lactated Ringer's at 100 mL/h.   3. Hypertension, essential.  Blood pressure stable  4.Anemia of chronic disease Hemoglobin continues to decline.    LOS: 5 Carma Dwiggins 7/11/202312:01 PM

## 2021-10-14 NOTE — Progress Notes (Signed)
PROGRESS NOTE    Vickie Caldwell  TKP:[id number] DOB: 29-Dec-1950  DOA: 10/08/2021 Date of Service: 10/14/21 PCP: Theotis Burrow, MD     Brief Narrative / Hospital Course:  Vickie Caldwell is a 71 y.o. 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 Patient reports being unable to take her medications due to nausea for several days and complains of diarrhea as well. She states that she has a history of hyponatremia and hypokalemia but her numbers were worse today during her follow-up and so she was sent to the ER for further evaluation. 07/06: Sodium 117, K 2.7, Lg 1.6, Started on 3% saline per nephrology recs.  07/07: Nephrology following.  Was on 3% saline, on NS without much improvement, back on 3%.  Oncology including palliative care NP are following.   Family is requesting update regarding oncologic status/prognosis, I deferred detailed discussion to oncology team.  Per palliative notes, family to speak further regarding possible PEG placement for nutrition. MRI obtained to eval possible tumor extension vs CVA as cause for hearing loss -no acute intracranial abnormality, nasopharyngeal carcinoma with large area of abnormal contrast-enhancement extending into prevertebral and parapharyngeal soft tissues and clivus, bilateral mastoid effusions and moderate thickening sphenoid sinuses. 07/08: sodium slowly improving, now 125. WBC trending down to 14 (from 15). SPOke w/ patient I had to write down a lot to communicate w/ her, very HoH. She's ok w/ a feeding tube if needed.  07/09: sodium improving slowly, nephrology following. I can communicate w/ the patient by writing information and questions, she has good understanding of her  situation.  07/10: Sodium continues to improve slowly, potassium repleted this morning.   Later in afternoon, around 2:30 PM, noted elevated heart rate, EKG confirmed A-fib/RVR.  Ordered Cardizem IV push, did not convert and BP dropped.  Consulted cardiology, Dr Erin Fulling. Started amiodarone bolus/infusion IR unable to proceed w/ G-tube d/t airway. Gen Surg consulted and Dr Luther Bradley has evaluated and recommends involving anesthesia and ENT in the case 07/11: Sodium remained stable, hypokalemia has been a problem.  Pending feeding tube placement.  Will ask dietary to evaluate, may need to/benefit from TPN.  Have asked palliative care to reassess to assist with [place] discussion, I spoke with patient briefly but she is having a lot of pain with talking and no family present so I thought best to defer extensive [place] discussion at this time.  Patient did tell me she is "not ready to throw in the towel."  Consultants:  Nephrology Oncology IR Cardiology  General Surgery Anesthesiology ENT  Procedures: none    Subjective: reports throat pain and feels cold but otherwise no concerns      ASSESSMENT & PLAN:   Principal Problem:   Hyponatremia Active Problems:   Nasopharyngeal carcinoma (HCC)   Benign essential hypertension   Current smoker   Hypokalemia   Hypomagnesemia   Palliative care encounter   Atrial fibrillation with RVR ([place])   Hyponatremia Thought to be due to excessive fluid intake versus SIADH Patient states that she has cut back on her fluid intake as recommended Sodium level is down to 117 on admission 07/06, associated with weakness and some confusion Nephrology consulted and following closely. Sodium improving slowly  Atrial fibrillation with RVR ([place]) - RESOLVED New onset 10/13/21  Did not convert with IV push Cardizem, BP dropped Consulted cardiology, she was started on Amiodarone bolus/drip Sinus rhythm now 10/14/21 Per cardiology: Continue amiodarone infusion  given she is not taking p.o.  Consider metoprolol tartrate IV 2.5 twice daily.  IV fluids per nephrology, does not appear volume overloaded.  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 Oncology team following while inpatient, including oncology palliative care NP Feeding tube placement pending plan from gen surg, anesthesia .+/- ENT. IR unable to place.   Hypokalemia Noted to have severe hypokalemia replete this AM Pharmacy asked to help with monitoring and repletion  Check magnesium levels  Hypomagnesemia Most likely related to hypokalemia Supplement magnesium Follow levels  Current smoker Smoking cessation discussed with patient in detail nicotine transdermal patch 7 mg daily  Benign essential hypertension Hold home amlodipine Hydralazine prn HTN   DVT prophylaxis: lovenox  Code Status: FULL Family Communication: called brother, got voicemail Disposition Plan / TOC needs: remains inpatient appropriate Barriers to discharge / significant pending items: hyponatremia requiring close monitoring and IV medications, new Afib, GOC. Plan PEG placement. Poor prognosis.              Objective: Vitals:   10/14/21 0944 10/14/21 1000 10/14/21 1100 10/14/21 1200  BP:  130/72 133/73   Pulse: 87 84 (!) 101   Resp: '10 18 20   '$ Temp:    98.8 F (37.1 C)  TempSrc:    Oral  SpO2: 96% 96% 96%   Weight:      Height:        Intake/Output Summary (Last 24 hours) at 10/14/2021 1538 Last data filed at 10/14/2021 1247 Gross per 24 hour  Intake 3998.46 ml  Output 3200 ml  Net 798.46 ml    Filed Weights   10/10/2021 1004 11/03/2021 1242  Weight: 42.2 kg 43.7 kg    Examination:  Constitutional:  VS as above General Appearance: thin, NAD Respiratory: Normal respiratory effort Breath sounds normal, no wheeze/rhonchi/rales Cardiovascular: S1/S2, RRR No lower  extremity edema Gastrointestinal: Nontender, no masses Musculoskeletal:  No clubbing/cyanosis of digits Neurological: No cranial nerve deficit on limited exam Motor and sensation intact and symmetric Psychiatric: Normal judgment/insight Depressed mood and affect       Scheduled Medications:   aspirin  81 mg Oral Daily   Chlorhexidine Gluconate Cloth  6 each Topical Daily   enoxaparin (LOVENOX) injection  30 mg Subcutaneous Q24H   fluticasone  1 spray Each Nare Daily   lidocaine-prilocaine  1 Application Topical Once   metoprolol tartrate  2.5 mg Intravenous Q12H   montelukast  10 mg Oral Daily   nicotine  7 mg Transdermal Daily   mouth rinse  15 mL Mouth Rinse 4 times per day   pantoprazole (PROTONIX) IV  40 mg Intravenous Daily   sodium chloride flush  3 mL Intravenous Q12H   umeclidinium bromide  1 puff Inhalation Daily    Continuous Infusions:  sodium chloride Stopped (10/14/21 0901)   amiodarone 30 mg/hr (10/14/21 1258)   lactated ringers 100 mL/hr at 10/14/21 1158    PRN Medications:  sodium chloride, acetaminophen, albuterol, diazepam, hydrALAZINE, lactulose, morphine CONCENTRATE, ondansetron **OR** ondansetron (ZOFRAN) IV, mouth rinse, sodium chloride flush  Antimicrobials:  Anti-infectives (From admission, onward)    Start     Dose/Rate Route Frequency Ordered Stop   10/15/21 0000  vancomycin (VANCOCIN) IVPB 1000 mg/200 mL premix  Status:  Discontinued  1,000 mg 200 mL/hr over 60 Minutes Intravenous To Radiology 10/13/21 1345 10/13/21 1448       Data Reviewed: I have personally reviewed following labs and imaging studies  CBC: Recent Labs  Lab 10/12/2021 0854 10/10/21 0507 10/11/21 0722 10/12/21 0427 10/13/21 2248  WBC 14.1* 15.2* 14.0* 13.0* 14.2*  NEUTROABS 12.4*  --   --   --   --   HGB 9.7* 8.9* 9.8* 8.6* 8.2*  HCT 29.0* 26.8* 30.2* 26.4* 25.8*  MCV 82.6 82.5 84.1 83.0 83.8  PLT 512* 511* 520* 500* 115    Basic Metabolic  Panel: Recent Labs  Lab 10/15/2021 0854 10/23/2021 1301 10/12/21 0427 10/12/21 0923 10/12/21 2147 10/13/21 0424 10/13/21 1506 10/13/21 2248 10/14/21 0434 10/14/21 1344  NA 117*   < > 126*   < > 128* 129* 128* 126* 129*  128*  --   K 2.7*   < > 3.1*  --   --  2.5* 2.9* 3.4* 2.5* 3.4*  CL 76*   < > 92*  --   --  94* 93* 90* 94*  --   CO2 28   < > 25  --   --  '25 22 24 24  '$ --   GLUCOSE 158*   < > 113*  --   --  103* 98 125* 120*  --   BUN 10   < > <5*  --   --  <5* <5* <5* <5*  --   CREATININE 0.42*   < > 0.32*  --   --  0.32* 0.40* <0.30* <0.30*  --   CALCIUM 8.4*   < > 8.4*  --   --  8.4* 8.3* 7.8* 8.1*  --   MG 1.7  --   --   --   --   --  1.7  --  2.0  --    < > = values in this interval not displayed.    GFR: CrCl cannot be calculated (This lab value cannot be used to calculate CrCl because it is not a number: <0.30). Liver Function Tests: Recent Labs  Lab 10/22/2021 0854  AST 33  ALT 31  ALKPHOS 109  BILITOT 0.6  PROT 6.6  ALBUMIN 2.6*    No results for input(s): "LIPASE", "AMYLASE" in the last 168 hours. No results for input(s): "AMMONIA" in the last 168 hours. Coagulation Profile: No results for input(s): "INR", "PROTIME" in the last 168 hours. Cardiac Enzymes: No results for input(s): "CKTOTAL", "CKMB", "CKMBINDEX", "TROPONINI" in the last 168 hours. BNP (last 3 results) No results for input(s): "PROBNP" in the last 8760 hours. HbA1C: No results for input(s): "HGBA1C" in the last 72 hours. CBG: No results for input(s): "GLUCAP" in the last 168 hours. Lipid Profile: No results for input(s): "CHOL", "HDL", "LDLCALC", "TRIG", "CHOLHDL", "LDLDIRECT" in the last 72 hours. Thyroid Function Tests: Recent Labs    10/13/21 1506  TSH 1.275   Anemia Panel: No results for input(s): "VITAMINB12", "FOLATE", "FERRITIN", "TIBC", "IRON", "RETICCTPCT" in the last 72 hours. Urine analysis:    Component Value Date/Time   COLORURINE STRAW (A) 10/07/2021 1249    APPEARANCEUR CLEAR (A) 10/11/2021 1249   LABSPEC 1.003 (L) 11/02/2021 1249   PHURINE 9.0 (H) 10/08/2021 1249   GLUCOSEU NEGATIVE 10/19/2021 1249   HGBUR NEGATIVE 11/01/2021 1249   BILIRUBINUR NEGATIVE 10/24/2021 1249   KETONESUR 5 (A) 11/03/2021 1249   PROTEINUR NEGATIVE 10/23/2021 1249   NITRITE NEGATIVE 10/27/2021 1249   LEUKOCYTESUR NEGATIVE 10/22/2021 1249  Sepsis Labs: '@LABRCNTIP'$ (procalcitonin:4,lacticidven:4)  Recent Results (from the past 240 hour(s))  MRSA Next Gen by PCR, Nasal     Status: None   Collection Time: 10/24/2021 12:43 PM   Specimen: Nasal Mucosa; Nasal Swab  Result Value Ref Range Status   MRSA by PCR Next Gen NOT DETECTED NOT DETECTED Final    Comment: (NOTE) The GeneXpert MRSA Assay (FDA approved for NASAL specimens only), is one component of a comprehensive MRSA colonization surveillance program. It is not intended to diagnose MRSA infection nor to guide or monitor treatment for MRSA infections. Test performance is not FDA approved in patients less than 3 years old. Performed at [facility] [address], [place], [place] [address]          Radiology Studies last 96 hours: ECHOCARDIOGRAM COMPLETE  Result Date: 10/14/2021    ECHOCARDIOGRAM REPORT   Patient Name:   Vickie Caldwell Date of Exam: 10/14/2021 Medical Rec #:  [id number]        Height:       56.0 in Accession #:    [id number]       Weight:       96.3 lb Date of Birth:  01-20-1951        BSA:          1.301 m Patient Age:    80 years         BP:           141/70 mmHg Patient Gender: F                HR:           91 bpm. Exam Location:  [facility] Procedure: 2D Echo, Cardiac Doppler and Color Doppler Indications:     Atrial Fibrillation I48.91  History:         Patient has no prior history of Echocardiogram examinations.                  COPD and TIA; Risk Factors:Hypertension.  Sonographer:     Sherrie Sport Referring Phys:  [id number] Vickie Caldwell Diagnosing Phys: Serafina Royals MD   Sonographer Comments: Image acquisition challenging due to patient body habitus. IMPRESSIONS  1. Left ventricular ejection fraction, by estimation, is 60 to 65%. The left ventricle has normal function. The left ventricle has no regional wall motion abnormalities. Left ventricular diastolic parameters were normal.  2. Right ventricular systolic function is normal. The right ventricular size is normal.  3. The mitral valve is normal in structure. Trivial mitral valve regurgitation.  4. The aortic valve is normal in structure. Aortic valve regurgitation is not visualized. FINDINGS  Left Ventricle: Left ventricular ejection fraction, by estimation, is 60 to 65%. The left ventricle has normal function. The left ventricle has no regional wall motion abnormalities. The left ventricular internal cavity size was normal in size. There is  no left ventricular hypertrophy. Left ventricular diastolic parameters were normal. Right Ventricle: The right ventricular size is normal. No increase in right ventricular wall thickness. Right ventricular systolic function is normal. Left Atrium: Left atrial size was normal in size. Right Atrium: Right atrial size was normal in size. Pericardium: There is no evidence of pericardial effusion. Mitral Valve: The mitral valve is normal in structure. Trivial mitral valve regurgitation. Tricuspid Valve: The tricuspid valve is normal in structure. Tricuspid valve regurgitation is trivial. Aortic Valve: The aortic valve is normal in structure. Aortic valve regurgitation is not visualized. Aortic valve mean gradient measures  2.0 mmHg. Aortic valve peak gradient measures 4.2 mmHg. Aortic valve area, by VTI measures 3.00 cm. Pulmonic Valve: The pulmonic valve was normal in structure. Pulmonic valve regurgitation is not visualized. Aorta: The aortic root and ascending aorta are structurally normal, with no evidence of dilitation. IAS/Shunts: No atrial level shunt detected by color flow Doppler.  LEFT  VENTRICLE PLAX 2D LVIDd:         3.20 cm   Diastology LVIDs:         1.90 cm   LV e' medial:    7.62 cm/s LV PW:         1.20 cm   LV E/e' medial:  7.7 LV IVS:        0.90 cm   LV e' lateral:   7.07 cm/s LVOT diam:     2.00 cm   LV E/e' lateral: 8.2 LV SV:         46 LV SV Index:   36 LVOT Area:     3.14 cm  RIGHT VENTRICLE RV Basal diam:  2.80 cm RV S prime:     18.90 cm/s TAPSE (M-mode): 3.0 cm LEFT ATRIUM             Index        RIGHT ATRIUM           Index LA diam:        2.40 cm 1.84 cm/m   RA Area:     13.20 cm LA Vol (A2C):   35.2 ml 27.05 ml/m  RA Volume:   28.80 ml  22.13 ml/m LA Vol (A4C):   28.7 ml 22.05 ml/m LA Biplane Vol: 34.8 ml 26.74 ml/m  AORTIC VALVE AV Area (Vmax):    2.47 cm AV Area (Vmean):   2.27 cm AV Area (VTI):     3.00 cm AV Vmax:           103.00 cm/s AV Vmean:          73.300 cm/s AV VTI:            0.155 m AV Peak Grad:      4.2 mmHg AV Mean Grad:      2.0 mmHg LVOT Vmax:         80.90 cm/s LVOT Vmean:        52.900 cm/s LVOT VTI:          0.148 m LVOT/AV VTI ratio: 0.95  AORTA Ao Root diam: 3.10 cm MITRAL VALVE               TRICUSPID VALVE MV Area (PHT): 4.33 cm    TR Peak grad:   15.1 mmHg MV Decel Time: 175 msec    TR Vmax:        194.00 cm/s MV E velocity: 58.30 cm/s MV A velocity: 72.00 cm/s  SHUNTS MV E/A ratio:  0.81        Systemic VTI:  0.15 m                            Systemic Diam: 2.00 cm Serafina Royals MD Electronically signed by Serafina Royals MD Signature Date/Time: 10/14/2021/12:55:20 PM    Final    MR BRAIN W WO CONTRAST  Result Date: 10/11/2021 CLINICAL DATA:  Chronic headache EXAM: MRI HEAD WITHOUT AND WITH CONTRAST TECHNIQUE: Multiplanar, multiecho pulse sequences of the brain and surrounding structures were obtained without and with intravenous contrast. CONTRAST:  69m GADAVIST GADOBUTROL 1 MMOL/ML IV SOLN COMPARISON:  Head CT 08/07/2021 Head CT 06/10/2021 FINDINGS: Brain: No acute infarct, mass effect or extra-axial collection. No acute or chronic  hemorrhage. There is multifocal hyperintense T2-weighted signal within the white matter. Generalized volume loss. The midline structures are normal. Vascular: Major flow voids are preserved. Skull and upper cervical spine: Normal calvarium and skull base. Visualized upper cervical spine and soft tissues are normal. Sinuses/Orbits:Bilateral mastoid effusions. Moderate mucosal thickening of the sphenoid sinuses. Normal orbits. There is abnormal contrast enhancement of the nasopharynx and prevertebral soft tissues. Diffusely abnormal bone marrow signal within the clivus, which is likely invaded by adjacent tumor. Abnormal contrast enhancement extends bilaterally into the parapharyngeal spaces. IMPRESSION: 1. No acute intracranial abnormality. 2. No nasopharyngeal carcinoma with large area of abnormal contrast enhancement extending into the prevertebral and parapharyngeal soft tissues as well as invading the clivus. 3. Bilateral mastoid effusions and moderate mucosal thickening of the sphenoid sinuses. Electronically Signed   By: KUlyses JarredM.D.   On: 10/11/2021 02:05            LOS: 5 days       NEmeterio Reeve DO [facility] 10/14/2021, 3:38 PM   Staff may message me via secure chat in [employer] but this may not receive immediate response,  please page for urgent matters!  If 7PM-7AM, please contact night-coverage [email or web address]  Dictation software was used to generate the above note. Typos may occur and escape review, as with typed/written notes. Please contact Dr ASheppard Coildirectly for clarity if needed.

## 2021-10-14 NOTE — Progress Notes (Signed)
Vickie Caldwell   DOB:Jul 13, 1950   IO#:[id number]    Subjective: Patient resting in the bed.  Patient continues to have difficulty hearing.  PEG tube placement was held yesterday because of A-fib with RVR.  Patient currently on IV heparin.  Objective:  Vitals:   10/14/21 1600 10/14/21 1700  BP: 139/73 (!) 145/84  Pulse: (!) 110 (!) 108  Resp: 12 15  Temp: 98.1 F (36.7 C)   SpO2: 96% 95%     Intake/Output Summary (Last 24 hours) at 10/14/2021 1846 Last data filed at 10/14/2021 1247 Gross per 24 hour  Intake 3998.46 ml  Output 2600 ml  Net 1398.46 ml  Thin cachectic appearing female patient resting in the bed.  No acute distress.  Oral ulceration present.  Difficulty hearing. Physical Exam Vitals and nursing note reviewed.  HENT:     Head: Normocephalic and atraumatic.     Mouth/Throat:     Pharynx: Oropharynx is clear.  Eyes:     Extraocular Movements: Extraocular movements intact.     Pupils: Pupils are equal, round, and reactive to light.  Cardiovascular:     Rate and Rhythm: Normal rate and regular rhythm.  Pulmonary:     Comments: Decreased breath sounds bilaterally.  Abdominal:     Palpations: Abdomen is soft.  Musculoskeletal:        General: Normal range of motion.     Cervical back: Normal range of motion.  Skin:    General: Skin is warm.  Neurological:     General: No focal deficit present.     Mental Status: She is alert and oriented to person, place, and time.  Psychiatric:        Behavior: Behavior normal.        Judgment: Judgment normal.      Labs:  Lab Results  Component Value Date   WBC 14.2 (H) 10/13/2021   HGB 8.2 (L) 10/13/2021   HCT 25.8 (L) 10/13/2021   MCV 83.8 10/13/2021   PLT 375 10/13/2021   NEUTROABS 12.4 (H) 10/04/2021    Lab Results  Component Value Date   NA 128 (L) 10/14/2021   NA 129 (L) 10/14/2021   K 2.7 (LL) 10/14/2021   CL 94 (L) 10/14/2021   CO2 24 10/14/2021    Studies:  ECHOCARDIOGRAM COMPLETE  Result Date:  10/14/2021    ECHOCARDIOGRAM REPORT   Patient Name:   Vickie Caldwell Date of Exam: 10/14/2021 Medical Rec #:  [id number]        Height:       56.0 in Accession #:    [id number]       Weight:       96.3 lb Date of Birth:  [birth year]        BSA:          1.301 m Patient Age:    71 years         BP:           141/70 mmHg Patient Gender: F                HR:           91 bpm. Exam Location:  [facility] Procedure: 2D Echo, Cardiac Doppler and Color Doppler Indications:     Atrial Fibrillation I48.91  History:         Patient has no prior history of Echocardiogram examinations.  COPD and TIA; Risk Factors:Hypertension.  Sonographer:     Sherrie Sport Referring Phys:  [id number] [place] TANG Diagnosing Phys: Serafina Royals MD  Sonographer Comments: Image acquisition challenging due to patient body habitus. IMPRESSIONS  1. Left ventricular ejection fraction, by estimation, is 60 to 65%. The left ventricle has normal function. The left ventricle has no regional wall motion abnormalities. Left ventricular diastolic parameters were normal.  2. Right ventricular systolic function is normal. The right ventricular size is normal.  3. The mitral valve is normal in structure. Trivial mitral valve regurgitation.  4. The aortic valve is normal in structure. Aortic valve regurgitation is not visualized. FINDINGS  Left Ventricle: Left ventricular ejection fraction, by estimation, is 60 to 65%. The left ventricle has normal function. The left ventricle has no regional wall motion abnormalities. The left ventricular internal cavity size was normal in size. There is  no left ventricular hypertrophy. Left ventricular diastolic parameters were normal. Right Ventricle: The right ventricular size is normal. No increase in right ventricular wall thickness. Right ventricular systolic function is normal. Left Atrium: Left atrial size was normal in size. Right Atrium: Right atrial size was normal in size. Pericardium: There is no  evidence of pericardial effusion. Mitral Valve: The mitral valve is normal in structure. Trivial mitral valve regurgitation. Tricuspid Valve: The tricuspid valve is normal in structure. Tricuspid valve regurgitation is trivial. Aortic Valve: The aortic valve is normal in structure. Aortic valve regurgitation is not visualized. Aortic valve mean gradient measures 2.0 mmHg. Aortic valve peak gradient measures 4.2 mmHg. Aortic valve area, by VTI measures 3.00 cm. Pulmonic Valve: The pulmonic valve was normal in structure. Pulmonic valve regurgitation is not visualized. Aorta: The aortic root and ascending aorta are structurally normal, with no evidence of dilitation. IAS/Shunts: No atrial level shunt detected by color flow Doppler.  LEFT VENTRICLE PLAX 2D LVIDd:         3.20 cm   Diastology LVIDs:         1.90 cm   LV e' medial:    7.62 cm/s LV PW:         1.20 cm   LV E/e' medial:  7.7 LV IVS:        0.90 cm   LV e' lateral:   7.07 cm/s LVOT diam:     2.00 cm   LV E/e' lateral: 8.2 LV SV:         46 LV SV Index:   36 LVOT Area:     3.14 cm  RIGHT VENTRICLE RV Basal diam:  2.80 cm RV S prime:     18.90 cm/s TAPSE (M-mode): 3.0 cm LEFT ATRIUM             Index        RIGHT ATRIUM           Index LA diam:        2.40 cm 1.84 cm/m   RA Area:     13.20 cm LA Vol (A2C):   35.2 ml 27.05 ml/m  RA Volume:   28.80 ml  22.13 ml/m LA Vol (A4C):   28.7 ml 22.05 ml/m LA Biplane Vol: 34.8 ml 26.74 ml/m  AORTIC VALVE AV Area (Vmax):    2.47 cm AV Area (Vmean):   2.27 cm AV Area (VTI):     3.00 cm AV Vmax:           103.00 cm/s AV Vmean:  73.300 cm/s AV VTI:            0.155 m AV Peak Grad:      4.2 mmHg AV Mean Grad:      2.0 mmHg LVOT Vmax:         80.90 cm/s LVOT Vmean:        52.900 cm/s LVOT VTI:          0.148 m LVOT/AV VTI ratio: 0.95  AORTA Ao Root diam: 3.10 cm MITRAL VALVE               TRICUSPID VALVE MV Area (PHT): 4.33 cm    TR Peak grad:   15.1 mmHg MV Decel Time: 175 msec    TR Vmax:        194.00  cm/s MV E velocity: 58.30 cm/s MV A velocity: 72.00 cm/s  SHUNTS MV E/A ratio:  0.81        Systemic VTI:  0.15 m                            Systemic Diam: 2.00 cm Serafina Royals MD Electronically signed by Serafina Royals MD Signature Date/Time: 10/14/2021/12:55:20 PM    Final     71 year old female patient history of COPD; chronic hyponatremia; 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  Tolerating poorly [oral mucositis; severe hyponatremia; dehydration; hearing loss-see below].  Therapy currently on hold.    #Acute oral mucositis-secondary chemoradiation.  On IV narcotic pain medication.  #Severe electrolyte abnormalities including acute on chronic hyponatremia/hypokalemia secondary SIADH-cisplatin chemotherapy etc.  On electrolyte supplementation/followed by nephrology.  Currently on potassium/magnesium supplementation  #Malnutrition/oral mucositis/underlying throat malignancy-secondary to chemoradiation.  PEG tube currently on hold given acute events/A-fib with RVR.  S/p evaluation with ENT-patient cleared by ENT regarding airway/intubation for PEG tube placement.  Discussed with Dr. Christian Mate.  #A-fib with RVR-s/p amiodarone currently on IV heparin.  Currently in sinus rhythm followed by cardiology.  #Hearing loss-question otitis media versus cisplatin ototoxicity.  Await reevaluation with ENT outpatient.   #Overall prognosis/plan of care: I had a long discussion with the patient's grandson Marjory Lies regarding above multiple acute issues causing the interruption of treatment.  Currently overall prognosis is guarded given interruption of therapy which is very necessary in the context of acute illness.  We will plan to meet the  grandson tomorrow morning at the patient's bedside.  Cammie Sickle, MD 10/14/2021  6:46 PM

## 2021-10-15 ENCOUNTER — Ambulatory Visit: Payer: Medicare HMO

## 2021-10-15 DIAGNOSIS — E43 Unspecified severe protein-calorie malnutrition: Secondary | ICD-10-CM | POA: Insufficient documentation

## 2021-10-15 DIAGNOSIS — R1310 Dysphagia, unspecified: Secondary | ICD-10-CM

## 2021-10-15 DIAGNOSIS — E871 Hypo-osmolality and hyponatremia: Secondary | ICD-10-CM | POA: Diagnosis not present

## 2021-10-15 DIAGNOSIS — C119 Malignant neoplasm of nasopharynx, unspecified: Secondary | ICD-10-CM | POA: Diagnosis not present

## 2021-10-15 LAB — BASIC METABOLIC PANEL
Anion gap: 10 (ref 5–15)
BUN: <5 mg/dL — ABNORMAL LOW (ref 8–23)
CO2: 29 mmol/L (ref 22–32)
Calcium: 8.5 mg/dL — ABNORMAL LOW (ref 8.9–10.3)
Chloride: 89 mmol/L — ABNORMAL LOW (ref 98–111)
Creatinine, Ser: <0.3 mg/dL — ABNORMAL LOW (ref 0.44–1.00)
Glucose, Bld: 131 mg/dL — ABNORMAL HIGH (ref 70–99)
Potassium: 2.8 mmol/L — ABNORMAL LOW (ref 3.5–5.1)
Sodium: 128 mmol/L — ABNORMAL LOW (ref 135–145)

## 2021-10-15 LAB — SODIUM
Sodium: 128 mmol/L — ABNORMAL LOW (ref 135–145)
Sodium: 129 mmol/L — ABNORMAL LOW (ref 135–145)

## 2021-10-15 LAB — POTASSIUM
Potassium: 3 mmol/L — ABNORMAL LOW (ref 3.5–5.1)
Potassium: 3.4 mmol/L — ABNORMAL LOW (ref 3.5–5.1)

## 2021-10-15 LAB — MAGNESIUM: Magnesium: 1.8 mg/dL (ref 1.7–2.4)

## 2021-10-15 MED ORDER — WHITE PETROLATUM EX OINT
TOPICAL_OINTMENT | CUTANEOUS | Status: DC | PRN
  Filled 2021-10-15 (×2): qty 5

## 2021-10-15 MED ORDER — MAGNESIUM SULFATE 2 GM/50ML IV SOLN
2.0000 g | Freq: Once | INTRAVENOUS | Status: AC
  Administered 2021-10-15: 2 g via INTRAVENOUS
  Filled 2021-10-15: qty 50

## 2021-10-15 MED ORDER — POTASSIUM CHLORIDE 10 MEQ/100ML IV SOLN
10.0000 meq | INTRAVENOUS | Status: AC
  Administered 2021-10-15 (×2): 10 meq via INTRAVENOUS
  Filled 2021-10-15 (×3): qty 100

## 2021-10-15 MED ORDER — ENSURE ENLIVE PO LIQD
237.0000 mL | Freq: Three times a day (TID) | ORAL | Status: DC
  Administered 2021-10-15: 237 mL via ORAL

## 2021-10-15 MED ORDER — MORPHINE SULFATE (PF) 2 MG/ML IV SOLN
1.0000 mg | INTRAVENOUS | Status: DC | PRN
  Administered 2021-10-15 – 2021-10-16 (×5): 2 mg via INTRAVENOUS
  Filled 2021-10-15 (×5): qty 1

## 2021-10-15 NOTE — Progress Notes (Signed)
[facility] for Electrolyte Monitoring and Replacement   Recent Labs: Potassium (mmol/L)  Date Value  10/15/2021 3.4 (L)   Magnesium (mg/dL)  Date Value  10/15/2021 1.8   Calcium (mg/dL)  Date Value  10/15/2021 8.5 (L)   Albumin (g/dL)  Date Value  10/06/2021 2.6 (L)   Sodium (mmol/L)  Date Value  10/15/2021 128 (L)   Assessment: 71 y.o. 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 Now with new onset atrial fibrillation with RVR   MIVF lactated ringers 1,000 mL with potassium chloride 40 mEq  at 100 mL/hr  Goal of Therapy:  Potassium 4.0 - 5.1 mmol/L Magnesium 2.0 - 2.4 mg/dL All Other Electrolytes WNL  Plan:  Continue MIVF with potassium Recheck electrolytes in am  Vallery Sa, PharmD, BCPS Clinical Pharmacist 10/15/2021 3:02 PM

## 2021-10-15 NOTE — Progress Notes (Signed)
[facility] for Electrolyte Monitoring and Replacement   Recent Labs: Potassium (mmol/L)  Date Value  10/15/2021 2.8 (L)   Magnesium (mg/dL)  Date Value  10/15/2021 1.8   Calcium (mg/dL)  Date Value  10/15/2021 8.5 (L)   Albumin (g/dL)  Date Value  10/10/2021 2.6 (L)   Sodium (mmol/L)  Date Value  10/15/2021 128 (L)   Assessment: 71 y.o. 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 Now with new onset atrial fibrillation with RVR   MIVF lactated ringers 1,000 mL with potassium chloride 40 mEq  at 100 mL/hr  Goal of Therapy:  Potassium 4.0 - 5.1 mmol/L Magnesium 2.0 - 2.4 mg/dL All Other Electrolytes WNL  Plan:  10 mEq IV KCl x 2 2 grams IV magnesium sulfate x 1 Recheck potassium 1300  Vallery Sa, PharmD, BCPS  Clinical Pharmacist 10/15/2021 7:17 AM

## 2021-10-15 NOTE — Progress Notes (Signed)
[facility] SURGICAL PROGRESS NOTE (cpt [phone])  Hospital Day(s): 6.   Post op day(s):  Marland Kitchen   Interval History: Patient seen and examined, no acute events or new complaints overnight. Patient reports understanding and desire to proceed robotic assisted gastrostomy tube placement.  Family at bedside and assisted with discussion. .  Review of Systems:  Constitutional: denies fever, chills  Respiratory: denies any shortness of breath  Cardiovascular: denies chest pain or palpitations  Gastrointestinal: denies abdominal pain, N/V, or diarrhea/and bowel function as per interval history Genitourinary: denies burning with urination or urinary frequency Musculoskeletal: denies pain, decreased motor or sensation Integumentary: denies any other rashes or skin discolorations Neurological: denies HA or vision/hearing changes   Vital signs in last 24 hours: [min-max] current  Temp:  [98.1 F (36.7 C)-98.4 F (36.9 C)] 98.4 F (36.9 C) (07/11 2000) Pulse Rate:  [87-110] 94 (07/12 1100) Resp:  [10-21] 10 (07/12 1100) BP: (120-175)/(57-158) 139/77 (07/12 1100) SpO2:  [90 %-100 %] 100 % (07/12 1100)     Height: '4\' 8"'$  (142.2 cm) Weight: 43.7 kg BMI (Calculated): 21.61   Intake/Output last 2 shifts:  07/11 0701 - 07/12 0700 In: 2477.8 [I.V.:2077.7; IV Piggyback:400.1] Out: 1600 [Urine:1600]   Physical Exam:  Constitutional: alert, cooperative and no distress  HENT: normocephalic without obvious abnormality  Respiratory: breathing non-labored at rest  Cardiovascular: regular rate and sinus rhythm  Gastrointestinal: soft, non-tender, and non-distended Musculoskeletal: no edema or wounds, motor and sensation grossly intact, NT    Labs:     Latest Ref Rng & Units 10/13/2021   10:48 PM 10/12/2021    4:27 AM 10/11/2021    7:22 AM  CBC  WBC 4.0 - 10.5 K/uL 14.2  13.0  14.0   Hemoglobin 12.0 - 15.0 g/dL 8.2  8.6  9.8   Hematocrit 36.0 - 46.0 % 25.8  26.4  30.2   Platelets 150 - 400  K/uL 375  500  520       Latest Ref Rng & Units 10/15/2021    5:26 AM 10/15/2021   12:02 AM 10/14/2021    9:03 PM  CMP  Glucose 70 - 99 mg/dL 131     BUN 8 - 23 mg/dL <5     Creatinine 0.44 - 1.00 mg/dL <0.30     Sodium 135 - 145 mmol/L 128   127   Potassium 3.5 - 5.1 mmol/L 2.8  3.0    Chloride 98 - 111 mmol/L 89     CO2 22 - 32 mmol/L 29     Calcium 8.9 - 10.3 mg/dL 8.5        Imaging studies: No new pertinent imaging studies   Assessment/Plan:  71 y.o. female with advanced nasopharyngeal Ca, in midst of therapy, unable to sustain adequate nutrition due to swallowing issues. For feeding gastrostomy via robotic assisted laparoscopy planned for tomorrow. 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  No guarantees ever expressed or implied.    - Tentative procedure time  Thursday 11:30.       - Pre-op prophylactic abx  All of the above findings and recommendations were discussed with the patient, patient's family, and the medical team, and all of patient's and family's questions were answered to their expressed satisfaction.   -- Ronny Bacon M.D., [facility] 10/15/2021 12:36 PM

## 2021-10-15 NOTE — Progress Notes (Signed)
[place] NOTE       Patient ID: Vickie Caldwell MRN: [id number] DOB/AGE: [birth year] 71 y.o.  Admit date: 10/26/2021 Referring Physician Dr. 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  She was apparently unable to take her home medicines due to nausea for several days and also had ongoing diarrhea.  Cardiology is consulted on hospital day 4 due to new onset atrial fibrillation with RVR and medical optimization prior to surgical placement of PEG tube.  Interval History:  - converted to sinus rhythm 2 nights ago and stable in normal sinus rhythm -No further cardiovascular history and/or symptoms at this time -Echocardiogram showing normal LV systolic function with no evidence of significant valvular heart disease Past Medical History:  Diagnosis Date   Asthma    COPD (chronic obstructive pulmonary disease) (HCC)    Dysrhythmia    GERD (gastroesophageal reflux disease)    History of kidney stones    Hypertension    Osteoarthritis    TIA (transient ischemic attack) 2011   No Deficits   Vertigo     Past Surgical History:  Procedure Laterality Date   ABDOMINAL HYSTERECTOMY     BREAST BIOPSY Left 1980's   neg. Pt states punch biopsy at the nipple   IR IMAGING GUIDED PORT INSERTION  08/14/2021   LYMPH NODE DISSECTION Right    neck   MANDIBLE SURGERY     Sagittal split   NASAL ENDOSCOPY Bilateral 07/23/2021   Procedure: NASAL ENDOSCOPY WITH BIOPSY OF NASOPHARYNGEAL MASS;  Surgeon: Clyde Canterbury, MD;   Location: [facility] ORS;  Service: ENT;  Laterality: Bilateral;   THROAT SURGERY     Vocal cord polyps "burned"   TUBAL LIGATION     WISDOM TOOTH EXTRACTION      Medications Prior to Admission  Medication Sig Dispense Refill Last Dose   amLODipine (NORVASC) 10 MG tablet Take 10 mg by mouth daily.   Past Week   diazepam (VALIUM) 5 MG tablet Take 1 tablet (5 mg total) by mouth every 12 (twelve) hours as needed. 60 tablet 0 Past Week   fluticasone (FLONASE) 50 MCG/ACT nasal spray Place into both nostrils daily.   Past Week   metoprolol succinate (TOPROL-XL) 100 MG 24 hr tablet Take 100 mg by mouth daily. Take with or immediately following a meal.   Past Week   montelukast (SINGULAIR) 10 MG tablet Take 10 mg by mouth daily.   Past Week   morphine (MS CONTIN) 30 MG 12 hr tablet Take 1 tablet (30 mg total) by mouth every 12 (twelve) hours. 60 tablet 0 Past Week   Oxycodone HCl 10 MG TABS Take 1 tablet (10 mg total) by mouth every 4 (four) hours as needed. 84 tablet 0 Past Week   potassium chloride 20 MEQ/15ML (10%) SOLN Take 15 mLs (20 mEq total) by mouth 3 (three) times daily. 473 mL 2 Past Week   umeclidinium bromide (INCRUSE ELLIPTA) 62.5 MCG/ACT AEPB Inhale 1 puff into the lungs daily.   Past Week   acetaminophen (TYLENOL) 325 MG tablet Take 650 mg by mouth every 6 (six)  hours as needed.   prn at prn   albuterol (VENTOLIN HFA) 108 (90 Base) MCG/ACT inhaler Inhale into the lungs every 6 (six) hours as needed for wheezing or shortness of breath.   prn at prn   ASPIRIN 81 PO Take by mouth daily.      Cholecalciferol (VITAMIN D3 PO) Take by mouth daily.      famotidine (PEPCID) 10 MG tablet Take 20 mg by mouth daily.      HYDROcodone-acetaminophen (NORCO) 10-325 MG tablet Take 1 tablet by mouth every 6 (six) hours as needed. 45 tablet 0 prn at prn   lactulose (CHRONULAC) 10 GM/15ML solution Take 15 mLs (10 g total) by mouth 2 (two) times daily as needed for moderate constipation. 236 mL 0 prn at prn    lidocaine-prilocaine (EMLA) cream Apply on the port. 30 -45 min  prior to port access. 30 g 3 prn at prn   ondansetron (ZOFRAN) 8 MG tablet One pill every 8 hours as needed for nausea/vomitting. 40 tablet 1 prn at prn   potassium chloride SA (KLOR-CON M) 20 MEQ tablet 1 pill twice a day (Patient not taking: Reported on 10/15/2021) 60 tablet 3 Not Taking   promethazine (PHENERGAN) 25 MG tablet Take 0.5 tablets (12.5 mg total) by mouth every 8 (eight) hours as needed for refractory nausea / vomiting. 30 tablet 0 prn at prn   Social History   Socioeconomic History   Marital status: Single    Spouse name: Not on file   Number of children: Not on file   Years of education: Not on file   Highest education level: Not on file  Occupational History   Not on file  Tobacco Use   Smoking status: Every Day    Packs/day: 1.00    Years: 51.00    Total pack years: 51.00    Types: Cigarettes   Smokeless tobacco: Never   Tobacco comments:    Started smoking around age 64  Vaping Use   Vaping Use: Never used  Substance and Sexual Activity   Alcohol use: Yes    Alcohol/week: 1.0 standard drink of alcohol    Types: 1 Cans of beer per week    Comment: occasional   Drug use: Never   Sexual activity: Not Currently    Birth control/protection: None  Other Topics Concern   Not on file  Social History Narrative   Smoker; sometimes beer/wine; used to work for Therapist, 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 Lives in [place]. With daughter-ZES//brother. From [place].    Social Determinants of Health   Financial Resource Strain: Not on file  Food Insecurity: Not on file  Transportation Needs: No Transportation Needs (10/02/2021)   PRAPARE - Hydrologist (Medical): No    Lack of Transportation (Non-Medical): No  Physical Activity: Not on file  Stress: Not on file  Social Connections: Not on file  Intimate Partner Violence: Not on file    Family History  Problem Relation Age of  Onset   Coronary artery disease Mother    Cancer Father 64       Oral   Cancer Brother 19       testicular   Prostate cancer Brother    Heart failure Brother    Breast cancer Neg Hx       PHYSICAL EXAM General: Elderly and cachectic Caucasian female in no acute distress.  Sitting upright in ICU bed HEENT:  Normocephalic and atraumatic.  Very hard  of hearing, hoarse voice Neck:  No JVD.  Chest: Right-sided port Lungs: Normal respiratory effort on room air. Clear bilaterally to auscultation. No wheezes, crackles, rhonchi.  Heart: Regular rate and rhythm. Normal S1 and S2 without gallops or murmurs. Radial & DP pulses 2+ bilaterally. Abdomen: Non-distended appearing.  Msk: Normal strength and tone for age. Extremities: Warm and well perfused. No clubbing, cyanosis.  No peripheral edema.  Neuro: Alert and oriented X 3. Psych:  Answers questions appropriately.   Labs:   Lab Results  Component Value Date   WBC 14.2 (H) 10/13/2021   HGB 8.2 (L) 10/13/2021   HCT 25.8 (L) 10/13/2021   MCV 83.8 10/13/2021   PLT 375 10/13/2021    Recent Labs  Lab 10/10/2021 0854 10/22/2021 1301 10/15/21 0526  NA 117*   < > 128*  K 2.7*   < > 2.8*  CL 76*   < > 89*  CO2 28   < > 29  BUN 10   < > <5*  CREATININE 0.42*   < > <0.30*  CALCIUM 8.4*   < > 8.5*  PROT 6.6  --   --   BILITOT 0.6  --   --   ALKPHOS 109  --   --   ALT 31  --   --   AST 33  --   --   GLUCOSE 158*   < > 131*   < > = values in this interval not displayed.    No results found for: "CKTOTAL", "CKMB", "CKMBINDEX", "TROPONINI" No results found for: "CHOL" No results found for: "HDL" No results found for: "LDLCALC" No results found for: "TRIG" No results found for: "CHOLHDL" No results found for: "LDLDIRECT"    Radiology: ECHOCARDIOGRAM COMPLETE  Result Date: 10/14/2021    ECHOCARDIOGRAM REPORT   Patient Name:   LINDSAY STRAKA Date of Exam: 10/14/2021 Medical Rec #:  [id number]        Height:       56.0 in Accession #:     [id number]       Weight:       96.3 lb Date of Birth:  09-Nov-1950        BSA:          1.301 m Patient Age:    14 years         BP:           141/70 mmHg Patient Gender: F                HR:           91 bpm. Exam Location:  [facility] Procedure: 2D Echo, Cardiac Doppler and Color Doppler Indications:     Atrial Fibrillation I48.91  History:         Patient has no prior history of Echocardiogram examinations.                  COPD and TIA; Risk Factors:Hypertension.  Sonographer:     Sherrie Sport Referring Phys:  [id number] Helen TANG Diagnosing Phys: Serafina Royals MD  Sonographer Comments: Image acquisition challenging due to patient body habitus. IMPRESSIONS  1. Left ventricular ejection fraction, by estimation, is 60 to 65%. The left ventricle has normal function. The left ventricle has no regional wall motion abnormalities. Left ventricular diastolic parameters were normal.  2. Right ventricular systolic function is normal. The right ventricular size is normal.  3. The mitral valve is normal in structure. Trivial mitral valve  regurgitation.  4. The aortic valve is normal in structure. Aortic valve regurgitation is not visualized. FINDINGS  Left Ventricle: Left ventricular ejection fraction, by estimation, is 60 to 65%. The left ventricle has normal function. The left ventricle has no regional wall motion abnormalities. The left ventricular internal cavity size was normal in size. There is  no left ventricular hypertrophy. Left ventricular diastolic parameters were normal. Right Ventricle: The right ventricular size is normal. No increase in right ventricular wall thickness. Right ventricular systolic function is normal. Left Atrium: Left atrial size was normal in size. Right Atrium: Right atrial size was normal in size. Pericardium: There is no evidence of pericardial effusion. Mitral Valve: The mitral valve is normal in structure. Trivial mitral valve regurgitation. Tricuspid Valve: The tricuspid valve is  normal in structure. Tricuspid valve regurgitation is trivial. Aortic Valve: The aortic valve is normal in structure. Aortic valve regurgitation is not visualized. Aortic valve mean gradient measures 2.0 mmHg. Aortic valve peak gradient measures 4.2 mmHg. Aortic valve area, by VTI measures 3.00 cm. Pulmonic Valve: The pulmonic valve was normal in structure. Pulmonic valve regurgitation is not visualized. Aorta: The aortic root and ascending aorta are structurally normal, with no evidence of dilitation. IAS/Shunts: No atrial level shunt detected by color flow Doppler.  LEFT VENTRICLE PLAX 2D LVIDd:         3.20 cm   Diastology LVIDs:         1.90 cm   LV e' medial:    7.62 cm/s LV PW:         1.20 cm   LV E/e' medial:  7.7 LV IVS:        0.90 cm   LV e' lateral:   7.07 cm/s LVOT diam:     2.00 cm   LV E/e' lateral: 8.2 LV SV:         46 LV SV Index:   36 LVOT Area:     3.14 cm  RIGHT VENTRICLE RV Basal diam:  2.80 cm RV S prime:     18.90 cm/s TAPSE (M-mode): 3.0 cm LEFT ATRIUM             Index        RIGHT ATRIUM           Index LA diam:        2.40 cm 1.84 cm/m   RA Area:     13.20 cm LA Vol (A2C):   35.2 ml 27.05 ml/m  RA Volume:   28.80 ml  22.13 ml/m LA Vol (A4C):   28.7 ml 22.05 ml/m LA Biplane Vol: 34.8 ml 26.74 ml/m  AORTIC VALVE AV Area (Vmax):    2.47 cm AV Area (Vmean):   2.27 cm AV Area (VTI):     3.00 cm AV Vmax:           103.00 cm/s AV Vmean:          73.300 cm/s AV VTI:            0.155 m AV Peak Grad:      4.2 mmHg AV Mean Grad:      2.0 mmHg LVOT Vmax:         80.90 cm/s LVOT Vmean:        52.900 cm/s LVOT VTI:          0.148 m LVOT/AV VTI ratio: 0.95  AORTA Ao Root diam: 3.10 cm MITRAL VALVE  TRICUSPID VALVE MV Area (PHT): 4.33 cm    TR Peak grad:   15.1 mmHg MV Decel Time: 175 msec    TR Vmax:        194.00 cm/s MV E velocity: 58.30 cm/s MV A velocity: 72.00 cm/s  SHUNTS MV E/A ratio:  0.81        Systemic VTI:  0.15 m                            Systemic Diam: 2.00 cm  Serafina Royals MD Electronically signed by Serafina Royals MD Signature Date/Time: 10/14/2021/12:55:20 PM    Final    MR BRAIN W WO CONTRAST  Result Date: 10/11/2021 CLINICAL DATA:  Chronic headache EXAM: MRI HEAD WITHOUT AND WITH CONTRAST TECHNIQUE: Multiplanar, multiecho pulse sequences of the brain and surrounding structures were obtained without and with intravenous contrast. CONTRAST:  38m GADAVIST GADOBUTROL 1 MMOL/ML IV SOLN COMPARISON:  Head CT 08/07/2021 Head CT 06/10/2021 FINDINGS: Brain: No acute infarct, mass effect or extra-axial collection. No acute or chronic hemorrhage. There is multifocal hyperintense T2-weighted signal within the white matter. Generalized volume loss. The midline structures are normal. Vascular: Major flow voids are preserved. Skull and upper cervical spine: Normal calvarium and skull base. Visualized upper cervical spine and soft tissues are normal. Sinuses/Orbits:Bilateral mastoid effusions. Moderate mucosal thickening of the sphenoid sinuses. Normal orbits. There is abnormal contrast enhancement of the nasopharynx and prevertebral soft tissues. Diffusely abnormal bone marrow signal within the clivus, which is likely invaded by adjacent tumor. Abnormal contrast enhancement extends bilaterally into the parapharyngeal spaces. IMPRESSION: 1. No acute intracranial abnormality. 2. No nasopharyngeal carcinoma with large area of abnormal contrast enhancement extending into the prevertebral and parapharyngeal soft tissues as well as invading the clivus. 3. Bilateral mastoid effusions and moderate mucosal thickening of the sphenoid sinuses. Electronically Signed   By: KUlyses JarredM.D.   On: 10/11/2021 02:05   DG Chest Portable 1 View  Result Date: 10/06/2021 CLINICAL DATA:  Hyponatremia. History of metastatic head and neck cancer. EXAM: PORTABLE CHEST 1 VIEW COMPARISON:  PET-CT dated Aug 07, 2021. FINDINGS: New right chest wall port catheter with tip in the proximal right atrium. The  heart size and mediastinal contours are within normal limits. Both lungs are clear. The visualized skeletal structures are unremarkable. IMPRESSION: No active disease. Electronically Signed   By: WTitus DubinM.D.   On: 10/27/2021 10:30    ECHO 04/2009 Summary:  The EF is estimated at 55-60%.  Abnormal left ventricular diastolic filling is observed, consistent with  impaired LV relaxation(Stage I).  There is no patent foramen ovale visualized with injection of agitated  saline.   Findings:  Proc  The study quality is fair.     Left Ventricle  The left ventricular chamber size is normal.  There is no left  ventricular hypertrophy observed.  There is normal left ventricular  systolic function.  Global left ventricular wall motion and  contractility are within normal limits.  The EF is estimated at 55-60%.  Abnormal left ventricular diastolic filling is observed, consistent with  impaired LV relaxation(Stage I).     Left Atrium  The left atrial chamber size is normal.  There is no patent foramen  ovale visualized.     Right Ventricle  The right ventricular cavity size is normal.  The right ventricular  global systolic function is normal.     Right Atrium  The right atrial  cavity size is normal.     Aortic Valve  The aortic valve structure is normal.  There is no evidence of aortic  regurgitation.     Mitral Valve  The mitral valve leaflets appear normal.  There is no evidence of mitral  regurgitation.     Tricuspid Valve  The tricuspid valve leaflets are morphologically normal.  There is no  evidence of tricuspid valve regurgitation.     Pulmonic Valve  The pulmonic valve is not well visualized.  There is no evidence of  pulmonic regurgitation.     Pericardium  The pericardium appears normal.     Aorta  The aortic root appears normal.     Venous  The inferior vena cava appears normal.     Miscellaneous  No evidence of thrombus, intra-cardiac shunting or  vegetation.      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  She was apparently unable to take her home medicines due to nausea for several days and also had ongoing diarrhea.  Cardiology is consulted on hospital day 4 due to new onset atrial fibrillation with RVR and medical optimization prior to surgical placement of PEG tube.  #New onset atrial fibrillation with RVR, converted to NSR on 7/10 #History of paroxysmal SVT #Hyponatremia, hypokalemia #Nasopharyngeal carcinoma The patient presents with poor p.o. intake and multiple electrolyte disturbances, has been getting IV fluids with rise in her sodium, although potassium remains depleted at 2.9 this afternoon, and magnesium on the low end of normal at 1.7.  She went into atrial fibrillation with RVR around 1350 on 7/10, refractory to IV diltiazem 10 mg x 1 and has been relatively hypotensive. Converted to NSR around 1700 on 7/10.  -Recommend monitoring and replating electrolytes to a K >4, Mg >2  -S/p diltiazem 10 mg IV x1.  Continue metoprolol tartrate 12.5 mg twice daily if her blood pressure allows and if she can take p.o. Can give metoprolol  tartrate IV 2.5 BID as an alternative.  -s/p amiodarone bolus, continue infusion for now as she is not taking PO and then would change to oral amiodarone through G-tube at 200 mg each day -Continue IV fluids as per nephrology. She does not appear volume overloaded or having any heart failure symptoms at this time -echocardiogram complete performed this morning, pending read -CHA2DS2-VASc 3 (age, sex, HTN), but in the setting of her malignancy and anticipatory need for procedures the risk or bleeding likely outweighs the benefit of starting systemic AC for long term stroke risk reduction  -Patient is currently at lowest risk possible current for cardiovascular complications with G-tube placement   Signed: Corey Skains ,  10/15/2021, 12:59 PM [facility]

## 2021-10-15 NOTE — Progress Notes (Addendum)
[facility]  ROUNDING NOTE   Subjective:   Vickie Caldwell is a 71 y.o. female with past medical history of COPD, GERD, hypertension, chronic hyponatremia and nasopharyngeal cancer. She presents to the ED at the request of the cancer center for abnormal labs. She has been admitted for Hypokalemia [E87.6] Hyponatremia [E87.1]  Patient is known to our practice and is followed by Dr Candiss Norse outpatient. She was last seen in our office on 09/18/21 for continued hyponatremia follow up.  Patient was seen today in ICU. Resting quietly Remains NPO No family at bedside  Sodium 128 IV fluids - LR @ 156m/hr Urine output 1.6 L in 24 hours Potassium 2.8  Objective:  Vital signs in last 24 hours:  Temp:  [98.1 F (36.7 C)-98.8 F (37.1 C)] 98.4 F (36.9 C) (07/11 2000) Pulse Rate:  [87-110] 94 (07/12 1100) Resp:  [10-28] 10 (07/12 1100) BP: (120-175)/(57-158) 139/77 (07/12 1100) SpO2:  [90 %-100 %] 100 % (07/12 1100)  Weight change:  Filed Weights   10/05/2021 1004 10/31/2021 1242  Weight: 42.2 kg 43.7 kg    Intake/Output: I/O last 3 completed shifts: In: 4258.1 [I.V.:3558.2; IV Piggyback:699.9] Out: 3300 [Urine:3300]   Intake/Output this shift:  Total I/O In: 1344 [I.V.:1123; IV Piggyback:220.9] Out: -   Physical Exam: General: NAD  Head: Normocephalic, atraumatic. Dry oral mucosal membranes  Eyes: Anicteric  Lungs:  Clear to auscultation, normal effort, room air  Heart: Regular rate and rhythm  Abdomen:  Soft, nontender  Extremities:  No peripheral edema.  Neurologic: Nonfocal, moving all four extremities  Skin: No lesions  Access: None    Basic Metabolic Panel: Recent Labs  Lab 10/26/2021 0854 10/17/2021 1301 10/13/21 0424 10/13/21 1506 10/13/21 2248 10/14/21 0434 10/14/21 1344 10/14/21 1755 10/14/21 2103 10/15/21 0002 10/15/21 0526  NA 117*   < > 129* 128* 126* 129*  128* 127*  --  127*  --  128*  K 2.7*   < > 2.5* 2.9* 3.4* 2.5* 3.4* 2.7*  --   3.0* 2.8*  CL 76*   < > 94* 93* 90* 94*  --   --   --   --  89*  CO2 28   < > '25 22 24 24  '$ --   --   --   --  29  GLUCOSE 158*   < > 103* 98 125* 120*  --   --   --   --  131*  BUN 10   < > <5* <5* <5* <5*  --   --   --   --  <5*  CREATININE 0.42*   < > 0.32* 0.40* <0.30* <0.30*  --   --   --   --  <0.30*  CALCIUM 8.4*   < > 8.4* 8.3* 7.8* 8.1*  --   --   --   --  8.5*  MG 1.7  --   --  1.7  --  2.0  --   --   --   --  1.8   < > = values in this interval not displayed.     Liver Function Tests: Recent Labs  Lab 10/12/2021 0854  AST 33  ALT 31  ALKPHOS 109  BILITOT 0.6  PROT 6.6  ALBUMIN 2.6*    No results for input(s): "LIPASE", "AMYLASE" in the last 168 hours. No results for input(s): "AMMONIA" in the last 168 hours.  CBC: Recent Labs  Lab 11/01/2021 0854 10/10/21 0507 10/11/21 0722 10/12/21  4854 10/13/21 2248  WBC 14.1* 15.2* 14.0* 13.0* 14.2*  NEUTROABS 12.4*  --   --   --   --   HGB 9.7* 8.9* 9.8* 8.6* 8.2*  HCT 29.0* 26.8* 30.2* 26.4* 25.8*  MCV 82.6 82.5 84.1 83.0 83.8  PLT 512* 511* 520* 500* 375     Cardiac Enzymes: No results for input(s): "CKTOTAL", "CKMB", "CKMBINDEX", "TROPONINI" in the last 168 hours.  BNP: Invalid input(s): "POCBNP"  CBG: No results for input(s): "GLUCAP" in the last 168 hours.  Microbiology: Results for orders placed or performed during the hospital encounter of 11/01/2021  MRSA Next Gen by PCR, Nasal     Status: None   Collection Time: 10/15/2021 12:43 PM   Specimen: Nasal Mucosa; Nasal Swab  Result Value Ref Range Status   MRSA by PCR Next Gen NOT DETECTED NOT DETECTED Final    Comment: (NOTE) The GeneXpert MRSA Assay (FDA approved for NASAL specimens only), is one component of a comprehensive MRSA colonization surveillance program. It is not intended to diagnose MRSA infection nor to guide or monitor treatment for MRSA infections. Test performance is not FDA approved in patients less than 49 years old. Performed at [facility], [address] [place], [place] [address]     Coagulation Studies: No results for input(s): "LABPROT", "INR" in the last 72 hours.  Urinalysis: No results for input(s): "COLORURINE", "LABSPEC", "PHURINE", "GLUCOSEU", "HGBUR", "BILIRUBINUR", "KETONESUR", "PROTEINUR", "UROBILINOGEN", "NITRITE", "LEUKOCYTESUR" in the last 72 hours.  Invalid input(s): "APPERANCEUR"     Imaging: ECHOCARDIOGRAM COMPLETE  Result Date: 10/14/2021    ECHOCARDIOGRAM REPORT   Patient Name:   Vickie Caldwell Date of Exam: 10/14/2021 Medical Rec #:  [id number]        Height:       56.0 in Accession #:    [id number]       Weight:       96.3 lb Date of Birth:  06/18/1950        BSA:          1.301 m Patient Age:    75 years         BP:           141/70 mmHg Patient Gender: F                HR:           91 bpm. Exam Location:  [facility] Procedure: 2D Echo, Cardiac Doppler and Color Doppler Indications:     Atrial Fibrillation I48.91  History:         Patient has no prior history of Echocardiogram examinations.                  COPD and TIA; Risk Factors:Hypertension.  Sonographer:     Sherrie Sport Referring Phys:  [id number] Athena TANG Diagnosing Phys: Serafina Royals MD  Sonographer Comments: Image acquisition challenging due to patient body habitus. IMPRESSIONS  1. Left ventricular ejection fraction, by estimation, is 60 to 65%. The left ventricle has normal function. The left ventricle has no regional wall motion abnormalities. Left ventricular diastolic parameters were normal.  2. Right ventricular systolic function is normal. The right ventricular size is normal.  3. The mitral valve is normal in structure. Trivial mitral valve regurgitation.  4. The aortic valve is normal in structure. Aortic valve regurgitation is not visualized. FINDINGS  Left Ventricle: Left ventricular ejection fraction, by estimation, is 60 to 65%. The left ventricle has normal function.  The left ventricle has no regional wall motion  abnormalities. The left ventricular internal cavity size was normal in size. There is  no left ventricular hypertrophy. Left ventricular diastolic parameters were normal. Right Ventricle: The right ventricular size is normal. No increase in right ventricular wall thickness. Right ventricular systolic function is normal. Left Atrium: Left atrial size was normal in size. Right Atrium: Right atrial size was normal in size. Pericardium: There is no evidence of pericardial effusion. Mitral Valve: The mitral valve is normal in structure. Trivial mitral valve regurgitation. Tricuspid Valve: The tricuspid valve is normal in structure. Tricuspid valve regurgitation is trivial. Aortic Valve: The aortic valve is normal in structure. Aortic valve regurgitation is not visualized. Aortic valve mean gradient measures 2.0 mmHg. Aortic valve peak gradient measures 4.2 mmHg. Aortic valve area, by VTI measures 3.00 cm. Pulmonic Valve: The pulmonic valve was normal in structure. Pulmonic valve regurgitation is not visualized. Aorta: The aortic root and ascending aorta are structurally normal, with no evidence of dilitation. IAS/Shunts: No atrial level shunt detected by color flow Doppler.  LEFT VENTRICLE PLAX 2D LVIDd:         3.20 cm   Diastology LVIDs:         1.90 cm   LV e' medial:    7.62 cm/s LV PW:         1.20 cm   LV E/e' medial:  7.7 LV IVS:        0.90 cm   LV e' lateral:   7.07 cm/s LVOT diam:     2.00 cm   LV E/e' lateral: 8.2 LV SV:         46 LV SV Index:   36 LVOT Area:     3.14 cm  RIGHT VENTRICLE RV Basal diam:  2.80 cm RV S prime:     18.90 cm/s TAPSE (M-mode): 3.0 cm LEFT ATRIUM             Index        RIGHT ATRIUM           Index LA diam:        2.40 cm 1.84 cm/m   RA Area:     13.20 cm LA Vol (A2C):   35.2 ml 27.05 ml/m  RA Volume:   28.80 ml  22.13 ml/m LA Vol (A4C):   28.7 ml 22.05 ml/m LA Biplane Vol: 34.8 ml 26.74 ml/m  AORTIC VALVE AV Area (Vmax):    2.47 cm AV Area (Vmean):   2.27 cm AV Area  (VTI):     3.00 cm AV Vmax:           103.00 cm/s AV Vmean:          73.300 cm/s AV VTI:            0.155 m AV Peak Grad:      4.2 mmHg AV Mean Grad:      2.0 mmHg LVOT Vmax:         80.90 cm/s LVOT Vmean:        52.900 cm/s LVOT VTI:          0.148 m LVOT/AV VTI ratio: 0.95  AORTA Ao Root diam: 3.10 cm MITRAL VALVE               TRICUSPID VALVE MV Area (PHT): 4.33 cm    TR Peak grad:   15.1 mmHg MV Decel Time: 175 msec    TR Vmax:  194.00 cm/s MV E velocity: 58.30 cm/s MV A velocity: 72.00 cm/s  SHUNTS MV E/A ratio:  0.81        Systemic VTI:  0.15 m                            Systemic Diam: 2.00 cm Serafina Royals MD Electronically signed by Serafina Royals MD Signature Date/Time: 10/14/2021/12:55:20 PM    Final      Medications:    sodium chloride Stopped (10/14/21 0901)   amiodarone 30 mg/hr (10/15/21 1100)   lactated ringers 1,000 mL with potassium chloride 40 mEq infusion 100 mL/hr at 10/15/21 1100   potassium chloride 100 mL/hr at 10/15/21 1100    aspirin  81 mg Oral Daily   Chlorhexidine Gluconate Cloth  6 each Topical Daily   enoxaparin (LOVENOX) injection  30 mg Subcutaneous Q24H   fluticasone  1 spray Each Nare Daily   lidocaine-prilocaine  1 Application Topical Once   metoprolol tartrate  2.5 mg Intravenous Q12H   montelukast  10 mg Oral Daily   nicotine  7 mg Transdermal Daily   mouth rinse  15 mL Mouth Rinse 4 times per day   pantoprazole (PROTONIX) IV  40 mg Intravenous Daily   sodium chloride flush  3 mL Intravenous Q12H   umeclidinium bromide  1 puff Inhalation Daily   sodium chloride, acetaminophen, albuterol, diazepam, hydrALAZINE, lactulose, morphine injection, morphine CONCENTRATE, ondansetron **OR** ondansetron (ZOFRAN) IV, mouth rinse, sodium chloride flush, white petrolatum  Assessment/ Plan:  Ms. Vickie Caldwell is a 71 y.o.  female  with past medical history of COPD, GERD, hypertension, chronic hyponatremia and nasopharyngeal cancer. She presents to the ED at  the request of the cancer center for abnormal labs. 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  Continue IVF at current rate.  We will continue to monitor.  Continue fluid restriction.  PEG placement currently held due to airway concerns.   Lab Results  Component Value Date   CREATININE <0.30 (L) 10/15/2021   CREATININE <0.30 (L) 10/14/2021   CREATININE <0.30 (L) 10/13/2021    Intake/Output Summary (Last 24 hours) at 10/15/2021 1156 Last data filed at 10/15/2021 1100 Gross per 24 hour  Intake 3091.58 ml  Output 1600 ml  Net 1491.58 ml    2. Hypokalemia, Potassium corrected to 3.4 yesterday with supplementation.  Potassium 2.8 today Continue IVF and IV supplementation   3. Hypertension, essential.  Blood pressure 139/77  4.Anemia of chronic disease Monitoring Hgb    LOS: 6 Jaclin Finks 7/12/202311:56 AM

## 2021-10-15 NOTE — Progress Notes (Signed)
PROGRESS NOTE    Vickie Caldwell  VOJ:[id number] DOB: Sep 04, 1950  DOA: 10/27/2021 Date of Service: 10/15/21 PCP: Theotis Burrow, MD     Brief Narrative / Hospital Course:  [name] is a 70 y.o. 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 Patient reports being unable to take her medications due to nausea for several days and complains of diarrhea as well. She states that she has a history of hyponatremia and hypokalemia but her numbers were worse today during her follow-up and so she was sent to the ER for further evaluation. 07/06: Sodium 117, K 2.7, Lg 1.6, Started on 3% saline per nephrology recs.  07/07: Nephrology following.  Was on 3% saline, on NS without much improvement, back on 3%.  Oncology including palliative care NP are following.   Family is requesting update regarding oncologic status/prognosis, I deferred detailed discussion to oncology team.  Per palliative notes, family to speak further regarding possible PEG placement for nutrition. MRI obtained to eval possible tumor extension vs CVA as cause for hearing loss -no acute intracranial abnormality, nasopharyngeal carcinoma with large area of abnormal contrast-enhancement extending into prevertebral and parapharyngeal soft tissues and clivus, bilateral mastoid effusions and moderate thickening sphenoid sinuses. 07/08: sodium slowly improving, now 125. WBC trending down to 14 (from 15). SPOke w/ patient I had to write down a lot to communicate w/ her, very HoH. She's ok w/ a feeding tube if needed.  07/09: sodium improving slowly, nephrology following. I can communicate w/ the patient by writing information and questions, she has good understanding of her  situation.  07/10: Sodium continues to improve slowly, potassium repleted this morning.   Later in afternoon, around 2:30 PM, noted elevated heart rate, EKG confirmed A-fib/RVR.  Ordered Cardizem IV push, did not convert and BP dropped.  Consulted cardiology, Dr Erin Fulling. Started amiodarone bolus/infusion IR unable to proceed w/ G-tube d/t airway. Gen Surg consulted and Dr Luther Bradley has evaluated and recommends involving anesthesia and ENT in the case 07/11: Sodium remained stable, hypokalemia has been a problem.  Pending feeding tube placement.  Will ask dietary to evaluate, may need to/benefit from TPN.  Have asked palliative care to reassess to assist with [place] discussion, I spoke with patient briefly but she is having a lot of pain with talking and no family present so I thought best to defer extensive [place] discussion at this time.  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  Planning for PEG placement tomorrow 03/18/2022 at 11:30 AM.  Remains in sinus rhythm, cardiology following, recommend continue with Toprol tartrate IV 2.5 twice daily since patient unable to take p.o., continuing amiodarone infusion and would change to oral through G-tube at 200 mg daily, echocardiogram completed and pending read.  CHA2DS2-VASc 3 but concern for benefit versus risk of long-term anticoagulation.  Patient lowest risk possible for surgery.  Dietary following, once G-tube in place plan on tube feeds.  Consultants:  Nephrology Oncology IR Cardiology  General Surgery Anesthesiology ENT  Procedures: none    Subjective: Patient seen and examined this  morning with family at bedside, daughter and grandson.  She is difficult to communicate with due to loss of hearing but  can communicate with writing.  I explained current situation/plan, asked if she has any questions and she said no.  Asked if she has any pain and she said no other than throat.     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 Sodium improving slowly and has stabilised, question reset osmostat  Atrial fibrillation with RVR ([name]) - RESOLVED New onset 10/13/21  Did not convert with IV push Cardizem, BP dropped Consulted cardiology, she was started on Amiodarone bolus/drip Sinus rhythm now 10/14/21 Per cardiology: Continue amiodarone infusion given she is not taking p.o.  Consider metoprolol tartrate IV 2.5 twice daily.  IV fluids per nephrology, does not appear volume overloaded.  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 Oncology team following while inpatient, including oncology palliative care NP Planning for PEG tube placement tomorrow, 10/07/2021  Hypokalemia Noted to have severe hypokalemia replete this AM Pharmacy asked to help with monitoring and repletion as patient has been difficult to keep normokalemic Check magnesium levels  Hypomagnesemia Most likely related to hypokalemia Supplement magnesium Follow levels  Current smoker Smoking cessation discussed with patient in detail nicotine transdermal patch 7 mg daily  Benign essential hypertension Hold home amlodipine Hydralazine prn HTN   DVT prophylaxis: lovenox  Code Status: FULL Family Communication: called brother, got  voicemail Disposition Plan / TOC needs: remains inpatient appropriate Barriers to discharge / significant pending items: hyponatremia requiring close monitoring and IV medications, new Afib, GOC. Plan PEG placement. Poor prognosis.              Objective: Vitals:   10/15/21 1100 10/15/21 1200 10/15/21 1300 10/15/21 1400  BP: 139/77 (!) 142/76 (!) 146/108 134/73  Pulse: 94 99 (!) 102 96  Resp: '10 13 20 11  '$ Temp:  98.3 F (36.8 C)    TempSrc:  Oral    SpO2: 100% 100% 99% 100%  Weight:      Height:        Intake/Output Summary (Last 24 hours) at 10/15/2021 1457 Last data filed at 10/15/2021 1326 Gross per 24 hour  Intake 3322.09 ml  Output 1600 ml  Net 1722.09 ml    Filed Weights   10/22/2021 1004 10/04/2021 1242  Weight: 42.2 kg 43.7 kg    Examination:  Constitutional:  VS as above General Appearance: thin, NAD Respiratory: Normal respiratory effort Breath sounds normal, no wheeze/rhonchi/rales Cardiovascular: S1/S2, RRR No lower extremity edema Gastrointestinal: Nontender, no masses Musculoskeletal:  No clubbing/cyanosis of digits Neurological: No cranial nerve deficit on limited exam Motor and sensation intact and symmetric Psychiatric: Normal judgment/insight Depressed mood and affect       Scheduled Medications:   Chlorhexidine Gluconate Cloth  6 each Topical Daily   enoxaparin (LOVENOX) injection  30 mg Subcutaneous Q24H   feeding supplement  237 mL Oral TID BM   fluticasone  1 spray Each Nare Daily   lidocaine-prilocaine  1 Application Topical Once   metoprolol tartrate  2.5 mg Intravenous Q12H   montelukast  10 mg Oral Daily   nicotine  7 mg Transdermal Daily   mouth rinse  15 mL Mouth Rinse 4 times per day   pantoprazole (PROTONIX) IV  40 mg Intravenous Daily   sodium chloride flush  3 mL Intravenous Q12H   umeclidinium bromide  1 puff Inhalation Daily    Continuous Infusions:  sodium chloride Stopped (10/14/21 0901)   amiodarone 30  mg/hr (10/15/21 1326)   lactated ringers 1,000 mL with potassium chloride 40 mEq infusion 100 mL/hr at 10/15/21 1326    PRN Medications:  sodium chloride, acetaminophen, albuterol, diazepam, hydrALAZINE, lactulose, morphine injection, morphine CONCENTRATE, ondansetron **OR** ondansetron (ZOFRAN) IV, mouth rinse, sodium chloride flush, white petrolatum  Antimicrobials:  Anti-infectives (From admission, onward)    Start     Dose/Rate Route Frequency Ordered Stop   10/15/21 0000  vancomycin (VANCOCIN) IVPB 1000 mg/200 mL premix  Status:  Discontinued        1,000 mg 200 mL/hr over 60 Minutes Intravenous To Radiology 10/13/21 1345 10/13/21 1448       Data Reviewed: I have personally reviewed following labs and imaging studies  CBC: Recent Labs  Lab 10/06/2021 0854 10/10/21 0507 10/11/21 0722 10/12/21 0427 10/13/21 2248  WBC 14.1* 15.2* 14.0* 13.0* 14.2*  NEUTROABS 12.4*  --   --   --   --   HGB 9.7* 8.9* 9.8* 8.6* 8.2*  HCT 29.0* 26.8* 30.2* 26.4* 25.8*  MCV 82.6 82.5 84.1 83.0 83.8  PLT 512* 511* 520* 500* 785    Basic Metabolic Panel: Recent Labs  Lab 10/29/2021 0854 11/02/2021 1301 10/13/21 0424 10/13/21 1506 10/13/21 2248 10/14/21 0434 10/14/21 1344 10/14/21 1755 10/14/21 2103 10/15/21 0002 10/15/21 0526 10/15/21 1259  NA 117*   < > 129* 128* 126* 129*  128* 127*  --  127*  --  128* 128*  K 2.7*   < > 2.5* 2.9* 3.4* 2.5* 3.4* 2.7*  --  3.0* 2.8* 3.4*  CL 76*   < > 94* 93* 90* 94*  --   --   --   --  89*  --   CO2 28   < > '25 22 24 24  '$ --   --   --   --  29  --   GLUCOSE 158*   < > 103* 98 125* 120*  --   --   --   --  131*  --   BUN 10   < > <5* <5* <5* <5*  --   --   --   --  <5*  --   CREATININE 0.42*   < > 0.32* 0.40* <0.30* <0.30*  --   --   --   --  <0.30*  --   CALCIUM 8.4*   < > 8.4* 8.3* 7.8* 8.1*  --   --   --   --  8.5*  --   MG 1.7  --   --  1.7  --  2.0  --   --   --   --  1.8  --    < > = values in this interval not displayed.    GFR: CrCl  cannot be calculated (This lab value cannot be used to calculate CrCl because it is not a number: <0.30). Liver Function Tests: Recent Labs  Lab 10/12/2021 0854  AST 33  ALT 31  ALKPHOS 109  BILITOT 0.6  PROT 6.6  ALBUMIN 2.6*  No results for input(s): "LIPASE", "AMYLASE" in the last 168 hours. No results for input(s): "AMMONIA" in the last 168 hours. Coagulation Profile: No results for input(s): "INR", "PROTIME" in the last 168 hours. Cardiac Enzymes: No results for input(s): "CKTOTAL", "CKMB", "CKMBINDEX", "TROPONINI" in the last 168 hours. BNP (last 3 results) No results for input(s): "PROBNP" in the last 8760 hours. HbA1C: No results for input(s): "HGBA1C" in the last 72 hours. CBG: No results for input(s): "GLUCAP" in the last 168 hours. Lipid Profile: No results for input(s): "CHOL", "HDL", "LDLCALC", "TRIG", "CHOLHDL", "LDLDIRECT" in the last 72 hours. Thyroid Function Tests: Recent Labs    10/13/21 1506  TSH 1.275    Anemia Panel: No results for input(s): "VITAMINB12", "FOLATE", "FERRITIN", "TIBC", "IRON", "RETICCTPCT" in the last 72 hours. Urine analysis:    Component Value Date/Time   COLORURINE STRAW (A) 10/09/2021 1249   APPEARANCEUR CLEAR (A) 10/30/2021 1249   LABSPEC 1.003 (L) 10/15/2021 1249   PHURINE 9.0 (H) 10/07/2021 1249   GLUCOSEU NEGATIVE 11/03/2021 1249   HGBUR NEGATIVE 10/20/2021 1249   BILIRUBINUR NEGATIVE 10/05/2021 1249   KETONESUR 5 (A) 10/22/2021 1249   PROTEINUR NEGATIVE 10/19/2021 1249   NITRITE NEGATIVE 10/08/2021 1249   LEUKOCYTESUR NEGATIVE 10/12/2021 1249   Sepsis Labs: '@LABRCNTIP'$ (procalcitonin:4,lacticidven:4)  Recent Results (from the past 240 hour(s))  MRSA Next Gen by PCR, Nasal     Status: None   Collection Time: 10/08/2021 12:43 PM   Specimen: Nasal Mucosa; Nasal Swab  Result Value Ref Range Status   MRSA by PCR Next Gen NOT DETECTED NOT DETECTED Final    Comment: (NOTE) The GeneXpert MRSA Assay (FDA approved for NASAL  specimens only), is one component of a comprehensive MRSA colonization surveillance program. It is not intended to diagnose MRSA infection nor to guide or monitor treatment for MRSA infections. Test performance is not FDA approved in patients less than 74 years old. Performed at [facility] [address] [place], [place] [address]          Radiology Studies last 96 hours: ECHOCARDIOGRAM COMPLETE  Result Date: 10/14/2021    ECHOCARDIOGRAM REPORT   Patient Name:   TWANIA BUJAK Date of Exam: 10/14/2021 Medical Rec #:  [id number]        Height:       56.0 in Accession #:    [id number]       Weight:       96.3 lb Date of Birth:  10-02-1950        BSA:          1.301 m Patient Age:    77 years         BP:           141/70 mmHg Patient Gender: F                HR:           91 bpm. Exam Location:  [facility] Procedure: 2D Echo, Cardiac Doppler and Color Doppler Indications:     Atrial Fibrillation I48.91  History:         Patient has no prior history of Echocardiogram examinations.                  COPD and TIA; Risk Factors:Hypertension.  Sonographer:     Sherrie Sport Referring Phys:  [id number] [place] TANG Diagnosing Phys: Serafina Royals MD  Sonographer Comments: Image acquisition challenging due to patient body habitus. IMPRESSIONS  1. Left ventricular ejection  fraction, by estimation, is 60 to 65%. The left ventricle has normal function. The left ventricle has no regional wall motion abnormalities. Left ventricular diastolic parameters were normal.  2. Right ventricular systolic function is normal. The right ventricular size is normal.  3. The mitral valve is normal in structure. Trivial mitral valve regurgitation.  4. The aortic valve is normal in structure. Aortic valve regurgitation is not visualized. FINDINGS  Left Ventricle: Left ventricular ejection fraction, by estimation, is 60 to 65%. The left ventricle has normal function. The left ventricle has no regional wall motion  abnormalities. The left ventricular internal cavity size was normal in size. There is  no left ventricular hypertrophy. Left ventricular diastolic parameters were normal. Right Ventricle: The right ventricular size is normal. No increase in right ventricular wall thickness. Right ventricular systolic function is normal. Left Atrium: Left atrial size was normal in size. Right Atrium: Right atrial size was normal in size. Pericardium: There is no evidence of pericardial effusion. Mitral Valve: The mitral valve is normal in structure. Trivial mitral valve regurgitation. Tricuspid Valve: The tricuspid valve is normal in structure. Tricuspid valve regurgitation is trivial. Aortic Valve: The aortic valve is normal in structure. Aortic valve regurgitation is not visualized. Aortic valve mean gradient measures 2.0 mmHg. Aortic valve peak gradient measures 4.2 mmHg. Aortic valve area, by VTI measures 3.00 cm. Pulmonic Valve: The pulmonic valve was normal in structure. Pulmonic valve regurgitation is not visualized. Aorta: The aortic root and ascending aorta are structurally normal, with no evidence of dilitation. IAS/Shunts: No atrial level shunt detected by color flow Doppler.  LEFT VENTRICLE PLAX 2D LVIDd:         3.20 cm   Diastology LVIDs:         1.90 cm   LV e' medial:    7.62 cm/s LV PW:         1.20 cm   LV E/e' medial:  7.7 LV IVS:        0.90 cm   LV e' lateral:   7.07 cm/s LVOT diam:     2.00 cm   LV E/e' lateral: 8.2 LV SV:         46 LV SV Index:   36 LVOT Area:     3.14 cm  RIGHT VENTRICLE RV Basal diam:  2.80 cm RV S prime:     18.90 cm/s TAPSE (M-mode): 3.0 cm LEFT ATRIUM             Index        RIGHT ATRIUM           Index LA diam:        2.40 cm 1.84 cm/m   RA Area:     13.20 cm LA Vol (A2C):   35.2 ml 27.05 ml/m  RA Volume:   28.80 ml  22.13 ml/m LA Vol (A4C):   28.7 ml 22.05 ml/m LA Biplane Vol: 34.8 ml 26.74 ml/m  AORTIC VALVE AV Area (Vmax):    2.47 cm AV Area (Vmean):   2.27 cm AV Area  (VTI):     3.00 cm AV Vmax:           103.00 cm/s AV Vmean:          73.300 cm/s AV VTI:            0.155 m AV Peak Grad:      4.2 mmHg AV Mean Grad:      2.0 mmHg LVOT Vmax:  80.90 cm/s LVOT Vmean:        52.900 cm/s LVOT VTI:          0.148 m LVOT/AV VTI ratio: 0.95  AORTA Ao Root diam: 3.10 cm MITRAL VALVE               TRICUSPID VALVE MV Area (PHT): 4.33 cm    TR Peak grad:   15.1 mmHg MV Decel Time: 175 msec    TR Vmax:        194.00 cm/s MV E velocity: 58.30 cm/s MV A velocity: 72.00 cm/s  SHUNTS MV E/A ratio:  0.81        Systemic VTI:  0.15 m                            Systemic Diam: 2.00 cm Serafina Royals MD Electronically signed by Serafina Royals MD Signature Date/Time: 10/14/2021/12:55:20 PM    Final             LOS: 6 days       Emeterio Reeve, DO [facility] 10/15/2021, 2:57 PM   Staff may message me via secure chat in [place]  but this may not receive immediate response,  please page for urgent matters!  If 7PM-7AM, please contact night-coverage [email or web address]  Dictation software was used to generate the above note. Typos may occur and escape review, as with typed/written notes. Please contact Dr Sheppard Coil directly for clarity if needed.

## 2021-10-15 NOTE — Progress Notes (Addendum)
Initial Nutrition Assessment  DOCUMENTATION CODES:   Severe malnutrition in context of chronic illness  INTERVENTION:   Once G-tube in place:  Osmolite 1.5- One carton 5 times daily via tube- Start with 1/3 carton five times daily and advance as tolerated. Flush with 75m of water before and after each feed.  Regimen provides 1775kcal/day, 75g/day protein and 12066mday of free water.   Pt at high refeed risk; recommend monitor potassium, magnesium and phosphorus labs daily until stable  Juven Fruit Punch BID via tube, each serving provides 95kcal and 2.5g of protein (amino acids glutamine and arginine)  Ensure Enlive po TID, each supplement provides 350 kcal and 20 grams of protein.  Daily weights   NUTRITION DIAGNOSIS:   Severe Malnutrition related to cancer and cancer related treatments as evidenced by 10 percent weight loss in 2 months, severe fat depletion, severe muscle depletion.  GOAL:   Patient will meet greater than or equal to 90% of their needs  MONITOR:   PO intake, Supplement acceptance, Labs, Weight trends, TF tolerance, Skin, I & O's  REASON FOR ASSESSMENT:   Consult Enteral/tube feeding initiation and management  ASSESSMENT:   [age]/o female with h/o nasopharyngeal carcinoma on chemo/XRT, mucositits, SIADH, HTN, TIA, diverticulosis, COPD, osteoarthritis and kidney stones who is admitted with electrolyte abnormalties and FTT.  Met with pt in room today. Pt is unable to provide nutrition related history. Pt is also HOH. Per family report, pt has only been able to eats sips/bites over the past 3 weeks. Pt with throat pain and has been drinking mainly liquids. Pt has been drinking some Ensure Max at home. [facility]. Pt and family have decided to move forward with G-tube placement. G-tube unable to be placed in IR, plan if for surgical G-tube placement tomorrow. Pt is at high  refeed risk. Will initiate tube feeds on 7/14. Per chart, pt is down 10lbs(10%) over the past 2 months; this is significant weight loss.   Medications reviewed and include: lovenox, nicotine, protonix, LRS w/ KCl _0 /hr  Labs reviewed: K 2.8(L), BUN <5(L), creat <0.30(L), Mg 1.8 wnl Wbc- 14.2(H), Hgb 8.2(L), Hct 25.8(L)  NUTRITION - FOCUSED PHYSICAL EXAM:  Flowsheet Row Most Recent Value  Orbital Region Moderate depletion  Upper Arm Region Severe depletion  Thoracic and Lumbar Region Severe depletion  Buccal Region Severe depletion  Temple Region Severe depletion  Clavicle Bone Region Severe depletion  Clavicle and Acromion Bone Region Severe depletion  Scapular Bone Region Severe depletion  Dorsal Hand Severe depletion  Patellar Region Severe depletion  Anterior Thigh Region Severe depletion  Posterior Calf Region Severe depletion  Edema (RD Assessment) None  Hair Reviewed  Eyes Reviewed  Mouth Reviewed  Skin Reviewed  Nails Reviewed   Diet Order:   Diet Order             Diet NPO time specified  Diet effective midnight           Diet full liquid Room service appropriate? Yes; Fluid consistency: Thin  Diet effective now                  EDUCATION NEEDS:   Not appropriate for education at this time  Skin:  Skin Assessment: Reviewed RN Assessment (ecchymosis, sacral PI)  Last BM:  7/8- type 6  Height:   Ht Readings from Last 1 Encounters:  10/08/2021 _1  (1.422 m)  Weight:   Wt Readings from Last 1 Encounters:  10/04/2021 43.7 kg    Ideal Body Weight:  42.7 kg  BMI:  Body mass index is 21.6 kg/m.  Estimated Nutritional Needs:   Kcal:  1500-1700kcal/day  Protein:  75-85g/day  Fluid:  1.3-1.5L/day  Koleen Distance MS, RD, LDN Please refer to [facility] for RD and/or RD on-call/weekend/after hours pager

## 2021-10-15 NOTE — Progress Notes (Signed)
Vickie Caldwell   DOB:24-Apr-1950   TW#:[id number]    Subjective: Patient resting in the bed.  Patient continues to have difficulty hearing.  PEG tube placement is currently on hold because of A-fib with RVR.  She is accompanied by her daughter and grandson.  Objective:  Vitals:   10/15/21 1700 10/15/21 1800  BP: (!) 151/87 (!) 126/102  Pulse: (!) 103 96  Resp: 14 11  Temp:    SpO2: 100% 100%     Intake/Output Summary (Last 24 hours) at 10/15/2021 2117 Last data filed at 10/15/2021 1834 Gross per 24 hour  Intake 3915.35 ml  Output 2050 ml  Net 1865.35 ml  Thin cachectic appearing female patient resting in the bed.  No acute distress.  Oral ulceration present.  Difficulty hearing. Physical Exam Vitals and nursing note reviewed.  HENT:     Head: Normocephalic and atraumatic.     Mouth/Throat:     Pharynx: Oropharynx is clear.  Eyes:     Extraocular Movements: Extraocular movements intact.     Pupils: Pupils are equal, round, and reactive to light.  Cardiovascular:     Rate and Rhythm: Normal rate and regular rhythm.  Pulmonary:     Comments: Decreased breath sounds bilaterally.  Abdominal:     Palpations: Abdomen is soft.  Musculoskeletal:        General: Normal range of motion.     Cervical back: Normal range of motion.  Skin:    General: Skin is warm.  Neurological:     General: No focal deficit present.     Mental Status: She is alert and oriented to person, place, and time.  Psychiatric:        Behavior: Behavior normal.        Judgment: Judgment normal.      Labs:  Lab Results  Component Value Date   WBC 14.2 (H) 10/13/2021   HGB 8.2 (L) 10/13/2021   HCT 25.8 (L) 10/13/2021   MCV 83.8 10/13/2021   PLT 375 10/13/2021   NEUTROABS 12.4 (H) 10/30/2021    Lab Results  Component Value Date   NA 128 (L) 10/15/2021   K 3.4 (L) 10/15/2021   CL 89 (L) 10/15/2021   CO2 29 10/15/2021    Studies:  ECHOCARDIOGRAM COMPLETE  Result Date: 10/14/2021     ECHOCARDIOGRAM REPORT   Patient Name:   Vickie Caldwell Date of Exam: 10/14/2021 Medical Rec #:  [id number]        Height:       56.0 in Accession #:    [id number]       Weight:       96.3 lb Date of Birth:  November 30, 1950        BSA:          1.301 m Patient Age:    71 years         BP:           141/70 mmHg Patient Gender: F                HR:           91 bpm. Exam Location:  [facility] Procedure: 2D Echo, Cardiac Doppler and Color Doppler Indications:     Atrial Fibrillation I48.91  History:         Patient has no prior history of Echocardiogram examinations.                  COPD and TIA; Risk Factors:Hypertension.  Sonographer:     Sherrie Sport Referring Phys:  [id number] [place] TANG Diagnosing Phys: Serafina Royals MD  Sonographer Comments: Image acquisition challenging due to patient body habitus. IMPRESSIONS  1. Left ventricular ejection fraction, by estimation, is 60 to 65%. The left ventricle has normal function. The left ventricle has no regional wall motion abnormalities. Left ventricular diastolic parameters were normal.  2. Right ventricular systolic function is normal. The right ventricular size is normal.  3. The mitral valve is normal in structure. Trivial mitral valve regurgitation.  4. The aortic valve is normal in structure. Aortic valve regurgitation is not visualized. FINDINGS  Left Ventricle: Left ventricular ejection fraction, by estimation, is 60 to 65%. The left ventricle has normal function. The left ventricle has no regional wall motion abnormalities. The left ventricular internal cavity size was normal in size. There is  no left ventricular hypertrophy. Left ventricular diastolic parameters were normal. Right Ventricle: The right ventricular size is normal. No increase in right ventricular wall thickness. Right ventricular systolic function is normal. Left Atrium: Left atrial size was normal in size. Right Atrium: Right atrial size was normal in size. Pericardium: There is no evidence of  pericardial effusion. Mitral Valve: The mitral valve is normal in structure. Trivial mitral valve regurgitation. Tricuspid Valve: The tricuspid valve is normal in structure. Tricuspid valve regurgitation is trivial. Aortic Valve: The aortic valve is normal in structure. Aortic valve regurgitation is not visualized. Aortic valve mean gradient measures 2.0 mmHg. Aortic valve peak gradient measures 4.2 mmHg. Aortic valve area, by VTI measures 3.00 cm. Pulmonic Valve: The pulmonic valve was normal in structure. Pulmonic valve regurgitation is not visualized. Aorta: The aortic root and ascending aorta are structurally normal, with no evidence of dilitation. IAS/Shunts: No atrial level shunt detected by color flow Doppler.  LEFT VENTRICLE PLAX 2D LVIDd:         3.20 cm   Diastology LVIDs:         1.90 cm   LV e' medial:    7.62 cm/s LV PW:         1.20 cm   LV E/e' medial:  7.7 LV IVS:        0.90 cm   LV e' lateral:   7.07 cm/s LVOT diam:     2.00 cm   LV E/e' lateral: 8.2 LV SV:         46 LV SV Index:   36 LVOT Area:     3.14 cm  RIGHT VENTRICLE RV Basal diam:  2.80 cm RV S prime:     18.90 cm/s TAPSE (M-mode): 3.0 cm LEFT ATRIUM             Index        RIGHT ATRIUM           Index LA diam:        2.40 cm 1.84 cm/m   RA Area:     13.20 cm LA Vol (A2C):   35.2 ml 27.05 ml/m  RA Volume:   28.80 ml  22.13 ml/m LA Vol (A4C):   28.7 ml 22.05 ml/m LA Biplane Vol: 34.8 ml 26.74 ml/m  AORTIC VALVE AV Area (Vmax):    2.47 cm AV Area (Vmean):   2.27 cm AV Area (VTI):     3.00 cm AV Vmax:           103.00 cm/s AV Vmean:          73.300 cm/s AV VTI:  0.155 m AV Peak Grad:      4.2 mmHg AV Mean Grad:      2.0 mmHg LVOT Vmax:         80.90 cm/s LVOT Vmean:        52.900 cm/s LVOT VTI:          0.148 m LVOT/AV VTI ratio: 0.95  AORTA Ao Root diam: 3.10 cm MITRAL VALVE               TRICUSPID VALVE MV Area (PHT): 4.33 cm    TR Peak grad:   15.1 mmHg MV Decel Time: 175 msec    TR Vmax:        194.00 cm/s MV E  velocity: 58.30 cm/s MV A velocity: 72.00 cm/s  SHUNTS MV E/A ratio:  0.81        Systemic VTI:  0.15 m                            Systemic Diam: 2.00 cm Serafina Royals MD Electronically signed by Serafina Royals MD Signature Date/Time: 10/14/2021/12:55:20 PM    Final     71 year old female patient history of COPD; chronic hyponatremia; 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  However, Therapy currently on hold.  Tolerating poorly [oral mucositis; severe hyponatremia; dehydration; hearing loss-see below].    #Acute oral mucositis-secondary chemoradiation.  On IV narcotic pain medication.    #Severe electrolyte abnormalities including acute on chronic hyponatremia/hypokalemia secondary SIADH-cisplatin chemotherapy etc.  On electrolyte supplementation/followed by nephrology.  Currently on potassium/magnesium supplementation  #Malnutrition/oral mucositis/underlying throat malignancy-secondary to chemoradiation.  PEG tube currently on hold given acute events/A-fib with RVR.  S/p evaluation with ENT-patient cleared by ENT regarding airway/intubation for PEG tube placement.  Discussed with Dr. Jamey Ripa for PEG tube placement on 7/13.   #A-fib with RVR-s/p amiodarone-  Currently in sinus rhythm followed by cardiology.  #Hearing loss-question otitis media versus cisplatin ototoxicity.  Await reevaluation with ENT outpatient.   #Overall prognosis/plan of care: I reviewed the histology /stage stage II/stage III /chance of cure being about 70% if patient goes through complete therapy including chemoradiation followed by adjuvant therapy.  However, patient therapy has been interrupted because of acute issues as above.  I had a long  discussion with the patient's grandson Marjory Lies and patients daughter regarding above multiple acute issues causing the interruption of treatment.  Currently overall prognosis from nasopharyngeal cancer is guarded given interruption of therapy which is very necessary in the context of acute illness.   Cammie Sickle, MD 10/15/2021  9:17 PM

## 2021-10-16 ENCOUNTER — Inpatient Hospital Stay: Payer: Medicare HMO | Admitting: Anesthesiology

## 2021-10-16 ENCOUNTER — Encounter: Admission: EM | Disposition: E | Payer: Self-pay | Source: Ambulatory Visit | Attending: Osteopathic Medicine

## 2021-10-16 ENCOUNTER — Inpatient Hospital Stay: Payer: Medicare HMO

## 2021-10-16 ENCOUNTER — Ambulatory Visit: Payer: Medicare HMO

## 2021-10-16 ENCOUNTER — Inpatient Hospital Stay: Payer: Medicare HMO | Admitting: Internal Medicine

## 2021-10-16 DIAGNOSIS — C119 Malignant neoplasm of nasopharynx, unspecified: Secondary | ICD-10-CM | POA: Diagnosis not present

## 2021-10-16 DIAGNOSIS — E871 Hypo-osmolality and hyponatremia: Secondary | ICD-10-CM | POA: Diagnosis not present

## 2021-10-16 LAB — CBC WITH DIFFERENTIAL/PLATELET
Abs Immature Granulocytes: 0.25 10*3/uL — ABNORMAL HIGH (ref 0.00–0.07)
Basophils Absolute: 0 10*3/uL (ref 0.0–0.1)
Basophils Relative: 0 %
Eosinophils Absolute: 0 10*3/uL (ref 0.0–0.5)
Eosinophils Relative: 0 %
HCT: 25.8 % — ABNORMAL LOW (ref 36.0–46.0)
Hemoglobin: 8.1 g/dL — ABNORMAL LOW (ref 12.0–15.0)
Immature Granulocytes: 2 %
Lymphocytes Relative: 1 %
Lymphs Abs: 0.2 10*3/uL — ABNORMAL LOW (ref 0.7–4.0)
MCH: 26.6 pg (ref 26.0–34.0)
MCHC: 31.4 g/dL (ref 30.0–36.0)
MCV: 84.9 fL (ref 80.0–100.0)
Monocytes Absolute: 1 10*3/uL (ref 0.1–1.0)
Monocytes Relative: 6 %
Neutro Abs: 14.7 10*3/uL — ABNORMAL HIGH (ref 1.7–7.7)
Neutrophils Relative %: 91 %
Platelets: 361 10*3/uL (ref 150–400)
RBC: 3.04 MIL/uL — ABNORMAL LOW (ref 3.87–5.11)
RDW: 13.4 % (ref 11.5–15.5)
WBC: 16.2 10*3/uL — ABNORMAL HIGH (ref 4.0–10.5)
nRBC: 0 % (ref 0.0–0.2)

## 2021-10-16 LAB — RENAL FUNCTION PANEL
Albumin: 2.1 g/dL — ABNORMAL LOW (ref 3.5–5.0)
Albumin: 1.9 g/dL — ABNORMAL LOW (ref 3.5–5.0)
Anion gap: 11 (ref 5–15)
Anion gap: 11 (ref 5–15)
BUN: <5 mg/dL — ABNORMAL LOW (ref 8–23)
BUN: <5 mg/dL — ABNORMAL LOW (ref 8–23)
CO2: 30 mmol/L (ref 22–32)
CO2: 26 mmol/L (ref 22–32)
Calcium: 8.3 mg/dL — ABNORMAL LOW (ref 8.9–10.3)
Calcium: 7.9 mg/dL — ABNORMAL LOW (ref 8.9–10.3)
Chloride: 90 mmol/L — ABNORMAL LOW (ref 98–111)
Chloride: 94 mmol/L — ABNORMAL LOW (ref 98–111)
Creatinine, Ser: <0.3 mg/dL — ABNORMAL LOW (ref 0.44–1.00)
Creatinine, Ser: 0.37 mg/dL — ABNORMAL LOW (ref 0.44–1.00)
GFR, Estimated: >60 mL/min (ref >60)
Glucose, Bld: 135 mg/dL — ABNORMAL HIGH (ref 70–99)
Glucose, Bld: 175 mg/dL — ABNORMAL HIGH (ref 70–99)
Phosphorus: 2.6 mg/dL (ref 2.5–4.6)
Phosphorus: 3.4 mg/dL (ref 2.5–4.6)
Potassium: 3.2 mmol/L — ABNORMAL LOW (ref 3.5–5.1)
Potassium: 3.5 mmol/L (ref 3.5–5.1)
Sodium: 131 mmol/L — ABNORMAL LOW (ref 135–145)
Sodium: 131 mmol/L — ABNORMAL LOW (ref 135–145)

## 2021-10-16 LAB — MAGNESIUM: Magnesium: 1.9 mg/dL (ref 1.7–2.4)

## 2021-10-16 LAB — PROTIME-INR
INR: 1.3 — ABNORMAL HIGH (ref 0.8–1.2)
Prothrombin Time: 15.6 seconds — ABNORMAL HIGH (ref 11.4–15.2)

## 2021-10-16 LAB — BLOOD GAS, ARTERIAL
Acid-base deficit: 0.2 mmol/L (ref 0.0–2.0)
Bicarbonate: 24 mmol/L (ref 20.0–28.0)
FIO2: 60 %
MECHVT: 400 mL
Mechanical Rate: 16
O2 Saturation: 97.7 %
PEEP: 5 cmH2O
Patient temperature: 37
pCO2 arterial: 37 mmHg (ref 32–48)
pH, Arterial: 7.42 (ref 7.35–7.45)
pO2, Arterial: 135 mmHg — ABNORMAL HIGH (ref 83–108)

## 2021-10-16 LAB — ABO/RH: ABO/RH(D): A POS

## 2021-10-16 LAB — SODIUM: Sodium: 133 mmol/L — ABNORMAL LOW (ref 135–145)

## 2021-10-16 LAB — APTT: aPTT: 23 seconds — ABNORMAL LOW (ref 24–36)

## 2021-10-16 SURGERY — INSERTION, GASTROSTOMY TUBE, ROBOT-ASSISTED
Anesthesia: General

## 2021-10-16 MED ORDER — SODIUM CHLORIDE 0.9 % IV BOLUS
1000.0000 mL | Freq: Once | INTRAVENOUS | Status: AC
  Administered 2021-10-16: 1000 mL via INTRAVENOUS

## 2021-10-16 MED ORDER — MIDAZOLAM HCL 2 MG/2ML IJ SOLN
1.0000 mg | INTRAMUSCULAR | Status: DC | PRN
  Administered 2021-10-16: 1 mg via INTRAVENOUS

## 2021-10-16 MED ORDER — SODIUM CHLORIDE 0.9% IV SOLUTION: Freq: Once | INTRAVENOUS | Status: DC

## 2021-10-16 MED ORDER — MONTELUKAST SODIUM 10 MG PO TABS
10.0000 mg | ORAL_TABLET | Freq: Every day | ORAL | Status: DC
Start: 2021-10-16 — End: 2021-10-16
  Administered 2021-10-16: 10 mg
  Filled 2021-10-16: qty 1

## 2021-10-16 MED ORDER — FENTANYL 2500MCG IN NS 250ML (10MCG/ML) PREMIX INFUSION
25.0000 ug/h | INTRAVENOUS | Status: DC
  Administered 2021-10-16: 50 ug/h via INTRAVENOUS
  Filled 2021-10-16: qty 250

## 2021-10-16 MED ORDER — ACETAMINOPHEN 325 MG PO TABS: 650.0000 mg | ORAL_TABLET | Freq: Four times a day (QID) | ORAL | Status: DC | PRN

## 2021-10-16 MED ORDER — NOREPINEPHRINE 4 MG/250ML-% IV SOLN
INTRAVENOUS | Status: AC
  Administered 2021-10-16: 4 mg
  Filled 2021-10-16: qty 250

## 2021-10-16 MED ORDER — PROPOFOL 10 MG/ML IV BOLUS
INTRAVENOUS | Status: AC
  Filled 2021-10-16: qty 20

## 2021-10-16 MED ORDER — ATROPINE SULFATE 1 MG/ML IV SOLN
1.0000 mg | Freq: Once | INTRAVENOUS | Status: AC
  Administered 2021-10-16: 1 mg via INTRAVENOUS

## 2021-10-16 MED ORDER — POTASSIUM CHLORIDE 10 MEQ/100ML IV SOLN
10.0000 meq | INTRAVENOUS | Status: AC
  Administered 2021-10-16 (×4): 10 meq via INTRAVENOUS
  Filled 2021-10-16 (×4): qty 100

## 2021-10-16 MED ORDER — LORAZEPAM 2 MG/ML IJ SOLN
2.0000 mg | INTRAMUSCULAR | Status: DC | PRN
  Administered 2021-10-16: 4 mg via INTRAVENOUS
  Filled 2021-10-16: qty 2

## 2021-10-16 MED ORDER — ACETAMINOPHEN 650 MG RE SUPP: 650.0000 mg | Freq: Four times a day (QID) | RECTAL | Status: DC | PRN

## 2021-10-16 MED ORDER — DEXMEDETOMIDINE HCL IN NACL 400 MCG/100ML IV SOLN
0.4000 ug/kg/h | INTRAVENOUS | Status: DC
  Administered 2021-10-16: 0.6 ug/kg/h via INTRAVENOUS
  Filled 2021-10-16: qty 100

## 2021-10-16 MED ORDER — DOCUSATE SODIUM 50 MG/5ML PO LIQD
100.0000 mg | Freq: Two times a day (BID) | ORAL | Status: DC
  Administered 2021-10-16: 100 mg
  Filled 2021-10-16: qty 10

## 2021-10-16 MED ORDER — ROCURONIUM BROMIDE 10 MG/ML (PF) SYRINGE
PREFILLED_SYRINGE | INTRAVENOUS | Status: AC
  Filled 2021-10-16: qty 10

## 2021-10-16 MED ORDER — LIDOCAINE HCL (PF) 2 % IJ SOLN
INTRAMUSCULAR | Status: AC
  Filled 2021-10-16: qty 5

## 2021-10-16 MED ORDER — MIDAZOLAM HCL 2 MG/2ML IJ SOLN
1.0000 mg | INTRAMUSCULAR | Status: DC | PRN
  Filled 2021-10-16 (×3): qty 2

## 2021-10-16 MED ORDER — FENTANYL CITRATE (PF) 100 MCG/2ML IJ SOLN
INTRAMUSCULAR | Status: AC
  Filled 2021-10-16: qty 2

## 2021-10-16 MED ORDER — POLYETHYLENE GLYCOL 3350 17 G PO PACK
17.0000 g | PACK | Freq: Every day | ORAL | Status: DC
  Administered 2021-10-16: 17 g
  Filled 2021-10-16: qty 1

## 2021-10-16 MED ORDER — FENTANYL 2500MCG IN NS 250ML (10MCG/ML) PREMIX INFUSION
0.0000 ug/h | INTRAVENOUS | Status: DC
  Administered 2021-10-16 – 2021-10-17 (×2): 400 ug/h via INTRAVENOUS
  Filled 2021-10-16 (×3): qty 250

## 2021-10-16 MED ORDER — FENTANYL CITRATE PF 50 MCG/ML IJ SOSY
PREFILLED_SYRINGE | INTRAMUSCULAR | Status: AC
  Administered 2021-10-16: 25 ug via INTRAVENOUS
  Filled 2021-10-16: qty 2

## 2021-10-16 MED ORDER — SODIUM CHLORIDE 0.9 % IV SOLN: INTRAVENOUS | Status: DC

## 2021-10-16 MED ORDER — FENTANYL CITRATE PF 50 MCG/ML IJ SOSY: 25.0000 ug | PREFILLED_SYRINGE | Freq: Once | INTRAMUSCULAR | Status: AC

## 2021-10-16 MED ORDER — FENTANYL BOLUS VIA INFUSION
25.0000 ug | INTRAVENOUS | Status: DC | PRN
  Administered 2021-10-16: 25 ug via INTRAVENOUS
  Administered 2021-10-16 (×3): 50 ug via INTRAVENOUS

## 2021-10-16 MED ORDER — GLYCOPYRROLATE 0.2 MG/ML IJ SOLN: 0.2000 mg | INTRAMUSCULAR | Status: DC | PRN

## 2021-10-16 MED ORDER — ALBUMIN HUMAN 25 % IV SOLN
12.5000 g | Freq: Once | INTRAVENOUS | Status: AC
  Administered 2021-10-16: 12.5 g via INTRAVENOUS
  Filled 2021-10-16: qty 50

## 2021-10-16 MED ORDER — MIDAZOLAM HCL 2 MG/2ML IJ SOLN
2.0000 mg | INTRAMUSCULAR | Status: AC
  Administered 2021-10-16: 2 mg via INTRAVENOUS

## 2021-10-16 MED ORDER — AMIODARONE HCL 200 MG PO TABS
200.0000 mg | ORAL_TABLET | Freq: Every day | ORAL | Status: DC
Start: 2021-10-16 — End: 2021-10-16
  Administered 2021-10-16: 200 mg
  Filled 2021-10-16: qty 1

## 2021-10-16 MED ORDER — POLYVINYL ALCOHOL 1.4 % OP SOLN
1.0000 [drp] | Freq: Four times a day (QID) | OPHTHALMIC | Status: DC | PRN
  Administered 2021-10-17: 1 [drp] via OPHTHALMIC
  Filled 2021-10-16: qty 15

## 2021-10-16 MED ORDER — FENTANYL BOLUS VIA INFUSION: 100.0000 ug | INTRAVENOUS | Status: DC | PRN

## 2021-10-16 MED ORDER — IOHEXOL 350 MG/ML SOLN
75.0000 mL | Freq: Once | INTRAVENOUS | Status: AC | PRN
  Administered 2021-10-16: 75 mL via INTRAVENOUS

## 2021-10-16 MED ORDER — TRANEXAMIC ACID 1000 MG/10ML IV SOLN
2000.0000 mg | Freq: Once | INTRAVENOUS | Status: AC
  Administered 2021-10-16: 2000 mg via TOPICAL
  Filled 2021-10-16: qty 20

## 2021-10-16 MED ORDER — NOREPINEPHRINE 16 MG/250ML-% IV SOLN
0.0000 ug/min | INTRAVENOUS | Status: DC
  Administered 2021-10-16: 4 ug/min via INTRAVENOUS
  Filled 2021-10-16: qty 250

## 2021-10-16 MED ORDER — DIGOXIN 0.25 MG/ML IJ SOLN
0.5000 mg | Freq: Once | INTRAMUSCULAR | Status: AC
  Administered 2021-10-16: 0.5 mg via INTRAVENOUS
  Filled 2021-10-16: qty 2

## 2021-10-16 MED ORDER — TRANEXAMIC ACID FOR INHALATION
500.0000 mg | Freq: Once | RESPIRATORY_TRACT | Status: AC
  Administered 2021-10-16: 500 mg via RESPIRATORY_TRACT
  Filled 2021-10-16: qty 5

## 2021-10-16 SURGICAL SUPPLY — 45 items
CANNULA REDUC XI 12-8 STAPL (CANNULA)
CANNULA REDUCER 12-8 DVNC XI (CANNULA) IMPLANT
COVER TIP SHEARS 8 DVNC (MISCELLANEOUS) ×1 IMPLANT
COVER TIP SHEARS 8MM DA VINCI (MISCELLANEOUS) ×1
DERMABOND ADVANCED (GAUZE/BANDAGES/DRESSINGS) ×1
DERMABOND ADVANCED .7 DNX12 (GAUZE/BANDAGES/DRESSINGS) ×1 IMPLANT
DRAPE ARM DVNC X/XI (DISPOSABLE) ×4 IMPLANT
DRAPE COLUMN DVNC XI (DISPOSABLE) ×1 IMPLANT
DRAPE DA VINCI XI ARM (DISPOSABLE) ×4
DRAPE DA VINCI XI COLUMN (DISPOSABLE) ×1
ELECT REM PT RETURN 9FT ADLT (ELECTROSURGICAL) ×2
ELECTRODE REM PT RTRN 9FT ADLT (ELECTROSURGICAL) ×1 IMPLANT
GLOVE ORTHO TXT STRL SZ7.5 (GLOVE) ×6 IMPLANT
GOWN STRL REUS W/ TWL LRG LVL3 (GOWN DISPOSABLE) ×2 IMPLANT
GOWN STRL REUS W/ TWL XL LVL3 (GOWN DISPOSABLE) ×2 IMPLANT
GOWN STRL REUS W/TWL LRG LVL3 (GOWN DISPOSABLE) ×2
GOWN STRL REUS W/TWL XL LVL3 (GOWN DISPOSABLE) ×2
GRASPER SUT TROCAR 14GX15 (MISCELLANEOUS) IMPLANT
IRRIGATION STRYKERFLOW (MISCELLANEOUS) IMPLANT
IRRIGATOR STRYKERFLOW (MISCELLANEOUS)
IV NS 1000ML (IV SOLUTION)
IV NS 1000ML BAXH (IV SOLUTION) IMPLANT
KIT PINK PAD W/HEAD ARE REST (MISCELLANEOUS) ×2
KIT PINK PAD W/HEAD ARM REST (MISCELLANEOUS) ×1 IMPLANT
KIT TURNOVER KIT A (KITS) ×2 IMPLANT
MANIFOLD NEPTUNE II (INSTRUMENTS) ×2 IMPLANT
NEEDLE HYPO 22GX1.5 SAFETY (NEEDLE) ×2 IMPLANT
NEEDLE INSUFFLATION 14GA 120MM (NEEDLE) ×2 IMPLANT
NS IRRIG 500ML POUR BTL (IV SOLUTION) ×2 IMPLANT
PACK LAP CHOLECYSTECTOMY (MISCELLANEOUS) ×2 IMPLANT
SEAL CANN UNIV 5-8 DVNC XI (MISCELLANEOUS) ×3 IMPLANT
SEAL XI 5MM-8MM UNIVERSAL (MISCELLANEOUS) ×3
SPIKE FLUID TRANSFER (MISCELLANEOUS) ×2 IMPLANT
STAPLER CANNULA SEAL DVNC XI (STAPLE) IMPLANT
STAPLER CANNULA SEAL XI (STAPLE)
SUT MNCRL 4-0 (SUTURE) ×1
SUT MNCRL 4-0 27XMFL (SUTURE) ×1
SUT VICRYL 0 AB UR-6 (SUTURE) ×2 IMPLANT
SUTURE MNCRL 4-0 27XMF (SUTURE) ×1 IMPLANT
SYS BAG RETRIEVAL 10MM (BASKET) ×2
SYSTEM BAG RETRIEVAL 10MM (BASKET) ×1 IMPLANT
TROCAR XCEL 12X100 BLDLESS (ENDOMECHANICALS) ×2 IMPLANT
TROCAR Z-THREAD FIOS 11X100 BL (TROCAR) ×2 IMPLANT
TUBING INSUFFLATION (TUBING) ×2 IMPLANT
WATER STERILE IRR 500ML POUR (IV SOLUTION) ×2 IMPLANT

## 2021-10-16 NOTE — Procedures (Signed)
PROCEDURE: BRONCHOSCOPY Therapeutic Aspiration of Tracheobronchial Tree  PROCEDURE DATE: 10/11/2021  TIME:  NAME:  Vickie Caldwell  DOB:15-Aug-1950  MRN: [id number] LOC:  IC13A/IC13A-AA    HOSP DAY: '@LENGTHOFSTAYDAYS'$ @ CODE STATUS:      Code Status Orders  (From admission, onward)           Start     Ordered   10/27/2021 1230  Full code  Continuous        10/20/2021 1230           Code Status History     This patient has a current code status but no historical code status.           Indications/Preliminary Diagnosis:   Consent: (Place X beside choice/s below)  The benefits, risks and possible complications of the procedure were        explained to:  ___ patient  __x_ patient's family  ___ other:___________  who verbalized understanding and gave:  ___ verbal  __x_ written  ___ verbal and written  ___ telephone  ___ other:________ consent.      Unable to obtain consent; procedure performed on emergent basis.     Other:       PRESEDATION ASSESSMENT: History and Physical has been performed. Patient meds and allergies have been reviewed. Presedation airway examination has been performed and documented. Baseline vital signs, sedation score, oxygenation status, and cardiac rhythm were reviewed. Patient was deemed to be in satisfactory condition to undergo the procedure.    PREMEDICATIONS:  None       PROCEDURE DETAILS: Timeout performed and correct patient, name, & ID confirmed. Following prep per Pulmonary policy, appropriate sedation was administered. The Bronchoscope was inserted in to oral cavity with bite block in place. Therapeutic aspiration of Tracheobronchial tree was performed.  Airway exam proceeded with findings, technical procedures, and specimen collection as noted below. At the end of exam the scope was withdrawn without incident. Impression and Plan as noted below.           Airway Prep (Place X beside choice below)   1% Transtracheal  Lidocaine Anesthetization 7 cc   Patient prepped per Bronchoscopy Lab Policy       Insertion Route (Place X beside choice below)   Nasal   Oral  x Endotracheal Tube   Tracheostomy   INTRAPROCEDURE MEDICATIONS:  Sedative/Narcotic Amt Dose   Versed  mg   Fentanyl gtt mcg  Diprivan  mg       Medication Amt Dose  Medication Amt Dose  Lidocaine 1%  cc  Epinephrine 1:10,000 sol  cc  Xylocaine 4%  cc  Cocaine  cc   TECHNICAL PROCEDURES: (Place X beside choice below)   Procedures  Description  X TOPICAL tXA  '200mg'$  AT RLL    Electrocautery     Cryotherapy     Balloon Dilatation     Bronchography     Stent Placement   x  Therapeutic Aspiration Right middle and right lower lobe    Laser/Argon Plasma    Brachytherapy Catheter Placement    Foreign Body Removal         SPECIMENS (Sites): (Place X beside choice below)  Specimens Description   No Specimens Obtained    x Washings    Lavage    Biopsies    Fine Needle Aspirates    Brushings    Sputum    FINDINGS:  ESTIMATED BLOOD LOSS:  COMPLICATIONS/RESOLUTION: none      IMPRESSION:POST-PROCEDURE DX:  BLOOD WAS NOTED IN RML AND RLL, THIS WAS SUCTIONED OUT, ICE COLD WASHINGS X 3 WERE PROVIDED ON RIGHT LUNG RUL, RLL, RML , TXA WAS DELIVERED TO RLL BASAL SEGMENTS WITH '200MG'$  in NS a total of 2ML mixed with 8ML of NS mixed in syringe and sprayed over RLL basal segments allowed to sit for 2 minutes then aspirated out. No additional blood was noted actively oozing post procedure.    RECOMMENDATION/PLAN:    REMAINS CRITICALLY ILL IN MICU    Ottie Glazier, M.D.  [facility] [place]

## 2021-10-16 NOTE — Progress Notes (Addendum)
[place] for Electrolyte Monitoring and Replacement   Recent Labs: Potassium (mmol/L)  Date Value  10/10/2021 3.2 (L)   Magnesium (mg/dL)  Date Value  10/10/2021 1.9   Calcium (mg/dL)  Date Value  10/06/2021 8.3 (L)   Albumin (g/dL)  Date Value  10/15/2021 2.1 (L)   Phosphorus (mg/dL)  Date Value  11/03/2021 2.6   Sodium (mmol/L)  Date Value  10/30/2021 131 (L)   Assessment: 71 y.o. 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 Now with new onset atrial fibrillation with RVR   MIVF lactated ringers 1,000 mL with potassium chloride 40 mEq  at 100 mL/hr  Goal of Therapy:  Potassium 4.0 - 5.1 mmol/L Magnesium 2.0 - 2.4 mg/dL All Other Electrolytes WNL  Plan:  Anticipate electrolyte needs to increase after TF's with PEG placement 10/11/2021. Current Mg & Phos WNL. K: 3.4>3.2. Will give KCL IV 27mq q1h x4 doses & Continue LR w 40K (7/11> Na: 128>129>131 (stable/slow improvement) Recheck electrolytes in am  BLorna Dibble PharmD, BWeirton Medical CenterClinical Pharmacist 10/23/2021 8:23 AM

## 2021-10-16 NOTE — IPAL (Addendum)
  Interdisciplinary Goals of Care Family Meeting   Date carried out: 10/04/2021  Location of the meeting: Conference room  Member's involved: Nurse Practitioner and Family Member or next of kin  Durable Power of Attorney or acting medical decision maker: Pts brother Vickie Caldwell, daughter Vickie Caldwell, and Vickie Caldwell along with additional family members  Discussion: We discussed goals of care for Vickie Caldwell .  Dr. 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  Per Dr. Pryor Ochoa the pt will likely have recurrent oropharyngeal bleeding or she will have an acute CVA due to compression resulting in Martin.  Recommended hospice/comfort care.  Dr. Rogue Bussing oncologist agreed with recommendations.  Relayed all information to family  Code status: Full DNR  Disposition: Continue current care for now without escalation.  Awaiting on daughter to arrive from [place] on 10/17/2021, and will transition to Full Comfort Care Measures Only  Time spent for the meeting: 20 minutes    Vickie Caldwell, [place] Pager [phone] (please enter 7 digits) PCCM Consult Pager [phone] (please enter 7 digits)

## 2021-10-16 NOTE — Anesthesia Preprocedure Evaluation (Signed)
Anesthesia Evaluation  Patient identified by MRN, date of birth, ID band Patient awake    Reviewed: Allergy & Precautions, NPO status , Patient's Chart, lab work & pertinent test results  History of Anesthesia Complications Negative for: history of anesthetic complications  Airway Mallampati: I   Neck ROM: Full    Dental  (+) Missing, Chipped   Pulmonary asthma , COPD, Current SmokerPatient did not abstain from smoking.,    Pulmonary exam normal breath sounds clear to auscultation       Cardiovascular hypertension, Normal cardiovascular exam+ dysrhythmias Atrial Fibrillation  Rhythm:Regular Rate:Normal  ECG 07/18/21:  Normal sinus rhythm Incomplete right bundle branch block Borderline ECG When compared with ECG of 21-Aug-1999 11:34, No significant change was found   Neuro/Psych Vertigo  TIA (2011)   GI/Hepatic GERD  ,  Endo/Other  negative endocrine ROS  Renal/GU Renal disease (nephrolithiasis)     Musculoskeletal  (+) Arthritis ,   Abdominal   Peds  Hematology negative hematology ROS (+)   Anesthesia Other Findings Nasopharyngeal carcinoma  -currently on weekly cisplatin with radiation,   chronic hyponatremia and hypokalemia -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 Posterior nasopharyngeal mass maximum SUV 12.7, compatible with malignancy. 2. Hypermetabolic right level IIb and right level IV lymph nodes compatible with malignant spread. 3. A 4 mm in short axis left level IIb lymph node has a maximum SUV of 2.1 which is about at the level of the blood pool, probably not involved. 4. Other imaging findings of potential clinical significance: Aortic Atherosclerosis (ICD10-I70.0). Coronary and  systemic atherosclerosis. Emphysema (ICD10-J43.9). Sigmoid colon diverticulosis. Scoliosis.  Reproductive/Obstetrics                             Anesthesia Physical  Anesthesia Plan  ASA: 3  Anesthesia Plan: General   Post-op Pain Management:    Induction: Intravenous  PONV Risk Score and Plan: 2 and Treatment may vary due to age or medical condition, Ondansetron and Dexamethasone  Airway Management Planned: Oral ETT  Additional Equipment:   Intra-op Plan:   Post-operative Plan: Extubation in OR  Informed Consent:     Dental advisory given  Plan Discussed with: CRNA  Anesthesia Plan Comments: (Patient consented for risks of anesthesia including but not limited to:  - adverse reactions to medications - damage to eyes, teeth, lips or other oral mucosa - nerve damage due to positioning  - sore throat or hoarseness - damage to heart, brain, nerves, lungs, other parts of body or loss of life  Informed patient about role of CRNA in peri- and intra-operative care.  Patient voiced understanding.)        Anesthesia Quick Evaluation

## 2021-10-16 NOTE — Progress Notes (Signed)
[facility] SURGICAL PROGRESS NOTE (cpt [phone])  Hospital Day(s): 7.   Interval History:  Patient with episode of decreased LOC and hypoxia this morning; found to have a large amount of blood and clots in airway Underwent emergent intubation and bronchoscopy She was seen shortly after Large amount of blood in canister and seen in ETT She is tachycardic to 120s; hypotensive starting on vasopressor Initially had plans for gastrostomy tube placement this afternoon  Review of Systems:  Unable to preform secondary to intubation  Vital signs in last 24 hours: [min-max] current  Temp:  [98.3 F (36.8 C)-98.6 F (37 C)] 98.6 F (37 C) (07/13 0400) Pulse Rate:  [88-103] 100 (07/13 0400) Resp:  [10-20] 20 (07/13 0400) BP: (126-152)/(73-127) 152/86 (07/13 0400) SpO2:  [94 %-100 %] 100 % (07/13 0400)     Height: '4\' 8"'$  (142.2 cm) Weight: 43.7 kg BMI (Calculated): 21.61   Intake/Output last 2 shifts:  07/12 0701 - 07/13 0700 In: 3515.1 [I.V.:3241; IV Piggyback:274.1] Out: 2550 [Urine:2550]   Physical Exam:  Constitutional: Intubated; sedated Respiratory: On ventilator; blood in ETT Cardiovascular: Tachycardic and sinus rhythm  Gastrointestinal:soft, non-distended, unable to reliable assess tenderness given intubation/sedation  Labs:     Latest Ref Rng & Units 10/28/2021    8:33 AM 10/13/2021   10:48 PM 10/12/2021    4:27 AM  CBC  WBC 4.0 - 10.5 K/uL 16.2  14.2  13.0   Hemoglobin 12.0 - 15.0 g/dL 8.1  8.2  8.6   Hematocrit 36.0 - 46.0 % 25.8  25.8  26.4   Platelets 150 - 400 K/uL 361  375  500       Latest Ref Rng & Units 10/15/2021    8:33 AM 10/17/2021    5:00 AM 10/15/2021   10:00 PM  CMP  Glucose 70 - 99 mg/dL 175  135    BUN 8 - 23 mg/dL <5  <5    Creatinine 0.44 - 1.00 mg/dL 0.37  <0.30    Sodium 135 - 145 mmol/L 131  131  129   Potassium 3.5 - 5.1 mmol/L 3.5  3.2    Chloride 98 - 111 mmol/L 94  90    CO2 22 - 32 mmol/L 26  30    Calcium 8.9 - 10.3 mg/dL  7.9  8.3       Imaging studies: No new pertinent imaging studies   Assessment/Plan: (ICD-10's: C11.9) 71 y.o. female with what appears to be bleeding, thought likely secondary to nasopharyngeal tumor, resulting in airway obstruction now intubated and requiring vasopressor support    - Discussed recent changes in clinical condition this morning with Anesthesia team/Dr Christian Mate. Given these acute changes and events this morning, will defer on gastrostomy tube placement at this time until work up is complete and GOC discussion is revisited. We will be readily available.   - Further management per PCCM team   All of the above findings and recommendations were discussed with the medical team(s).   -- Edison Simon, PA-C [facility] 10/30/2021, 9:28 AM M-F: 7am - 4pm

## 2021-10-16 NOTE — Progress Notes (Signed)
This RN was called to bedside by NT stating pt was coughing up blood. Upon entering room pt was noted to have large blood clots coming out of mouth. Suction with yankauer was immediately initiated. Pt quickly became cyanotic and unresponsive. O2 sats were reading 0% with an adequate pleth shown. MD called to bedside STAT. HR dropped to 40's, but palpable. 1 amp of atropine given and pt prepared for intubation. ETT placed with no complications despite large bright red blood clots being removed throughout. Pts o2 sat quickly increased to 90's and HR maintained in 100's. OG tube placed and CXR confirmed placement of both. BP dropped and Levophed started. Family called and updated. Md to speak with them on arrival.

## 2021-10-16 NOTE — Progress Notes (Signed)
Nutrition Follow Up Note  DOCUMENTATION CODES:   Severe malnutrition in context of chronic illness  INTERVENTION:   Once appropriate for tube feeds, recommend:  Vital 1.2'@45ml'$ /hr- Intimate at 33m/hr and advance by 137mq 12 hours until goal rate is reached.   Free water flushes 307m4 hours to maintain tube patency   Regimen provides 1296kcal/day, 81g/day of protein and 1056m34my of free water.   Pt at high refeed risk; recommend monitor potassium, magnesium and phosphorus labs daily until stable  Juven Fruit Punch BID via tube, each serving provides 95kcal and 2.5g of protein (amino acids glutamine and arginine)  Liquid MVI daily via tube   NUTRITION DIAGNOSIS:   Severe Malnutrition related to cancer and cancer related treatments as evidenced by 10 percent weight loss in 2 months, severe fat depletion, severe muscle depletion.  GOAL:   Provide needs based on ASPEN/SCCM guidelines  MONITOR:   Vent status, Labs, Weight trends, TF tolerance, Skin, I & O's  ASSESSMENT:   70 y47 female with h/o nasopharyngeal carcinoma on chemo/XRT, mucositits, SIADH, HTN, TIA, diverticulosis, COPD, osteoarthritis and kidney stones who is admitted with electrolyte abnormalties and FTT.  Pt with hemorrhage from the nasopharynx this morning requiring emergent intubation and bronchoscopy. Pt is not stable enough today for G-tube placement but does have an OGT in place. Pt with increasing pressor requirements this morning; RD will reassess for tube feed initiation later today. Pt is at high refeed risk; pharmacy working to replace electrolytes prior to tube feed initiation.   Medications reviewed and include: colace, nicotine, lovenox, protonix, miralax, levophed, KCl  Labs reviewed: Na 131(L), BUN <5(L), creat 0.37(L), P 3.4 wnl, Mg 1.9 wnl Wbc- 16.2(H), Hgb 8.1(L), Hct 25.8(L)  Patient is currently intubated on ventilator support MV: 6.6 L/min Temp (24hrs), Avg:98.6 F (37 C), Min:98.5 F  (36.9 C), Max:98.6 F (37 C)  Propofol: none   MAP- >65mm27m UOP- 2550ml 22met Order:   Diet Order             Diet NPO time specified  Diet effective midnight                  EDUCATION NEEDS:   Not appropriate for education at this time  Skin:  Skin Assessment: Reviewed RN Assessment (ecchymosis, sacral PI)  Last BM:  7/8- type 6  Height:   Ht Readings from Last 1 Encounters:  10/31/2021 '4\' 8"'$  (1.422 m)    Weight:   Wt Readings from Last 1 Encounters:  10/15/2021 43.7 kg    Ideal Body Weight:  42.7 kg  BMI:  Body mass index is 21.6 kg/m.  Estimated Nutritional Needs:   Kcal:  1000-1200kcal/day  Protein:  75-85g/day  Fluid:  1.3-1.5L/day  Kamare Caspers Koleen DistanceD, LDN Please refer to [facility] and/or RD on-call/weekend/after hours pager

## 2021-10-16 NOTE — Progress Notes (Addendum)
Patient placed on 28% T-Bar setup for comfort. ET tube not removed and cuff left inflated, per order, due to concerns of possible reoccurrence of bleeding into patients airway from the pseudoaneurysm of the internal carotid artery and the possibility of patient drowning on blood. Dr. Pryor Ochoa ENT and [name] DNP discussed this with patients family prior to change and they wish for her to be as comfortable as possible.

## 2021-10-16 NOTE — Progress Notes (Signed)
Vickie Caldwell   DOB:05/16/1950   RC#:[id number]    Subjective: Earlier this morning patient had massive hemoptysis; with significant drop of the blood pressures/needing vasopressors.  Patient currently undergoing bronchoscopy to evaluate further for cause of hemoptysis.   Objective:  Vitals:   10/21/2021 1700 10/13/2021 1800  BP: (!) 89/55 111/79  Pulse: (!) 101 (!) 102  Resp: 17 16  Temp:    SpO2: 94% 100%     Intake/Output Summary (Last 24 hours) at 10/15/2021 1959 Last data filed at 10/24/2021 1500 Gross per 24 hour  Intake 3943.28 ml  Output 1600 ml  Net 2343.28 ml    Physical Exam  Patient currently undergoing bronchoscopy; intubated.  Sedated.   Labs:  Lab Results  Component Value Date   WBC 16.2 (H) 10/26/2021   HGB 8.1 (L) 10/29/2021   HCT 25.8 (L) 10/14/2021   MCV 84.9 10/28/2021   PLT 361 10/28/2021   NEUTROABS 14.7 (H) 10/07/2021    Lab Results  Component Value Date   NA 133 (L) 10/15/2021   K 3.5 10/17/2021   CL 94 (L) 10/10/2021   CO2 26 10/23/2021    Studies:  CT ANGIO HEAD W OR WO CONTRAST  Result Date: 10/19/2021 CLINICAL DATA:  Oropharyngeal bleeding with nasopharyngeal tumor; Head/neck cancer, monitor Head/neck cancer, oropharyngeal bleeding EXAM: CT ANGIOGRAPHY HEAD CT NECK WITH CONTRAST TECHNIQUE: Multidetector CT imaging of the head was performed using the standard protocol during bolus administration of intravenous contrast. Multiplanar CT image reconstructions and MIPs were obtained to evaluate the vascular anatomy. Carotid stenosis measurements (when applicable) are obtained utilizing NASCET criteria, using the distal internal carotid diameter as the denominator. RADIATION DOSE REDUCTION: This exam was performed according to the departmental dose-optimization program which includes automated exposure control, adjustment of the mA and/or kV according to patient size and/or use of iterative reconstruction technique. CONTRAST:  50m OMNIPAQUE IOHEXOL  350 MG/ML SOLN COMPARISON:  None Available. FINDINGS: CT HEAD FINDINGS Brain: There is no acute intracranial hemorrhage, mass effect, or edema. Gray-white differentiation is preserved. Ventricles and sulci are stable in size and configuration. Patchy hypoattenuation in the supratentorial white matter is nonspecific but probably reflects mild chronic microvascular ischemic changes. No extra-axial collection. Vascular: Better evaluated on CTA portion. Skull: See below. Sinuses: Left sphenoid opacification. Orbits: Unremarkable. Review of the MIP images confirms the above findings CT NECK FINDINGS Streak and motion artifact. Pharynx and larynx: Known nasopharyngeal tumor with parapharyngeal and prevertebral involvement. Retained secretions in the pharynx. Salivary glands: Parotid and submandibular glands are unremarkable. Thyroid: Unremarkable. Lymph nodes: Decreased adenopathy since May 2023 PET CT. Vascular: Compression of the proximal internal jugular veins bilaterally below the skull base. Calcified plaque at the common carotid bifurcations. Further arterial findings detailed below. Visualized orbits: Unremarkable. Visualized mastoids and paranasal sinuses: Bilateral mastoid and middle ear opacification. Skeleton: Erosion of the clivus extending into occipital condyles. Cervical spine degenerative changes. Upper chest: Mild emphysema.  Possible 4 mm right upper lobe nodule. Other: Partially imaged endotracheal and enteric tubes. Partially imaged right chest wall port catheter. CTA HEAD FINDINGS Anterior circulation: There is narrowing of the cervical right internal carotid as it passes through the mass with at least 70% stenosis. Just below the skull base, there is a laterally directed wide neck outpouching measuring about 4 x 5 x 5 mm (series 8, image 14). Intracranially, there is persistent narrowing to the cavernous segment. Calcified plaque is present along the cavernous and supraclinoid portions with mild  stenosis. Similarly, there  is marked narrowing of the left cervical ICA as it passes through the abnormal soft tissue with focal fusiform dilatation just below the skull base measuring 6 mm (series 8, image 10). Intracranially, caliber is small to the cavernous segment. There is calcified plaque along the cavernous and supraclinoid portions with mild stenosis. Anterior and middle cerebral arteries are patent. Posterior circulation: Intracranial vertebral arteries are patent. Basilar artery is patent. Posterior cerebral arteries are patent. Bilateral posterior communicating arteries are present. Venous sinuses: As permitted by contrast timing, patent. 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 Decreased adenopathy since May 2023 PET CT. Marked (at least 70%) narrowing of cervical internal carotids passing through the above abnormal soft tissue. 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 Focal 6 mm fusiform dilatation on the left just below the skull base. Small caliber of the right greater than left internal carotids intracranially up to the cavernous segments. Possible new 4 mm right upper lobe nodule. Nonemergent dedicated chest CT recommended. Electronically Signed   By: Macy Mis M.D.   On: 10/29/2021 12:54   CT SOFT TISSUE NECK W CONTRAST  Result Date: 10/14/2021 CLINICAL DATA:  Oropharyngeal bleeding with nasopharyngeal tumor; Head/neck cancer, monitor Head/neck cancer, oropharyngeal bleeding EXAM: CT ANGIOGRAPHY HEAD CT NECK WITH CONTRAST TECHNIQUE: Multidetector CT imaging of the head was performed using the standard protocol during bolus administration of intravenous contrast. Multiplanar CT image reconstructions and MIPs were obtained to evaluate the vascular anatomy. Carotid stenosis measurements (when  applicable) are obtained utilizing NASCET criteria, using the distal internal carotid diameter as the denominator. RADIATION DOSE REDUCTION: This exam was performed according to the departmental dose-optimization program which includes automated exposure control, adjustment of the mA and/or kV according to patient size and/or use of iterative reconstruction technique. CONTRAST:  62m OMNIPAQUE IOHEXOL 350 MG/ML SOLN COMPARISON:  None Available. FINDINGS: CT HEAD FINDINGS Brain: There is no acute intracranial hemorrhage, mass effect, or edema. Gray-white differentiation is preserved. Ventricles and sulci are stable in size and configuration. Patchy hypoattenuation in the supratentorial white matter is nonspecific but probably reflects mild chronic microvascular ischemic changes. No extra-axial collection. Vascular: Better evaluated on CTA portion. Skull: See below. Sinuses: Left sphenoid opacification. Orbits: Unremarkable. Review of the MIP images confirms the above findings CT NECK FINDINGS Streak and motion artifact. Pharynx and larynx: Known nasopharyngeal tumor with parapharyngeal and prevertebral involvement. Retained secretions in the pharynx. Salivary glands: Parotid and submandibular glands are unremarkable. Thyroid: Unremarkable. Lymph nodes: Decreased adenopathy since May 2023 PET CT. Vascular: Compression of the proximal internal jugular veins bilaterally below the skull base. Calcified plaque at the common carotid bifurcations. Further arterial findings detailed below. Visualized orbits: Unremarkable. Visualized mastoids and paranasal sinuses: Bilateral mastoid and middle ear opacification. Skeleton: Erosion of the clivus extending into occipital condyles. Cervical spine degenerative changes. Upper chest: Mild emphysema.  Possible 4 mm right upper lobe nodule. Other: Partially imaged endotracheal and enteric tubes. Partially imaged right chest wall port catheter. CTA HEAD FINDINGS Anterior circulation:  There is narrowing of the cervical right internal carotid as it passes through the mass with at least 70% stenosis. Just below the skull base, there is a laterally directed wide neck outpouching measuring about 4 x 5 x 5 mm (series 8, image 14). Intracranially, there is persistent narrowing to the cavernous segment. Calcified plaque is present along the cavernous  and supraclinoid portions with mild stenosis. Similarly, there is marked narrowing of the left cervical ICA as it passes through the abnormal soft tissue with focal fusiform dilatation just below the skull base measuring 6 mm (series 8, image 10). Intracranially, caliber is small to the cavernous segment. There is calcified plaque along the cavernous and supraclinoid portions with mild stenosis. Anterior and middle cerebral arteries are patent. Posterior circulation: Intracranial vertebral arteries are patent. Basilar artery is patent. Posterior cerebral arteries are patent. Bilateral posterior communicating arteries are present. Venous sinuses: As permitted by contrast timing, patent. 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 Decreased adenopathy since May 2023 PET CT. Marked (at least 70%) narrowing of cervical internal carotids passing through the above abnormal soft tissue. 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 Focal 6 mm fusiform dilatation on the left just below the skull base. Small caliber of the right greater than left internal carotids intracranially up to the cavernous segments. Possible new 4 mm right upper lobe nodule. Nonemergent dedicated chest CT recommended. Electronically Signed   By: Macy Mis M.D.   On: 10/06/2021 12:54   DG Chest Port 1 View  Result Date: 11/01/2021 CLINICAL DATA:  Intubated. EXAM: PORTABLE CHEST 1 VIEW COMPARISON:   10/19/2021 FINDINGS: The endotracheal tube is 3.5 cm above the carina. The NG tube is coursing down the esophagus and into the stomach. Right IJ power port in good position, unchanged. The lungs are clear. No infiltrates, edema or effusions. No pulmonary lesions or pneumothorax. IMPRESSION: 1. Support apparatus in good position without complicating features. 2. No acute cardiopulmonary findings. Electronically Signed   By: Marijo Sanes M.D.   On: 10/22/2021 08:40   DG Abd 1 View  Result Date: 10/04/2021 CLINICAL DATA:  Status post endotracheal tube and OG tube placement. EXAM: ABDOMEN - 1 VIEW COMPARISON:  None Available. FINDINGS: Orogastric tube has been placed, tip overlying the level of the stomach. Bowel gas pattern is nonobstructive. Lung bases are clear. Moderate convex RIGHT scoliosis of the mid lumbar spine. IMPRESSION: Orogastric tube placement to the level of the stomach. Electronically Signed   By: Nolon Nations M.D.   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  However, Therapy currently on hold.  Tolerating poorly [oral mucositis; severe hyponatremia; dehydration; hearing loss-see below].  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   #A-fib with RVR-s/p amiodarone-   PLAN:  # I had a long discussion with the patient given acute turn of events given the massive hemoptysis in the context of  her underlying nasopharyngeal carcinoma.  Given the acute worsening of the events as noted above-patient is unlikely able to go back on treatment for her nasopharyngeal carcinoma.  Patient even if able to be salvaged from her current acute event-she will likely die from her progression of the nasopharyngeal carcinoma.  Patient's family including daughter; grandson; granddaughter in law; brother-all agree that patient would not want to  continue on life support.   #CODE STATUS discussed with the family at length.  They are in agreement with DNR/DNI.  #After lengthy discussion with multiple providers involved in the patient's care-family decided to proceed with comfort measures only.   # 50 minutes face-to-face with the patient's family discussing the above plan of care; more than 50% of time spent on prognosis/ natural history; counseling and coordination.   Cammie Sickle, MD 10/12/2021  7:59 PM

## 2021-10-16 NOTE — Consult Note (Signed)
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 B medical oncology, has chronic electrolyte disturbances with hyponatremia and hypokalemia, background history of COPD with centrilobular emphysema chronic hypoxemia, GERD came in initially from oncology due to findings of altered mental status with visual disturbances, hearing and taste disturbances and dysphagia.  Also reported nausea vomiting and diarrhea times few days.  Blood work notable for worsening hyponatremia and hypokalemia which prompted admission from the ED.  Her initial sodium levels were 117 however she was communicative and was able to tolerate p.o. at that time.  Nephrology was consulted and started 3 NS without much clinical improvement after initial 24-hour..  Palliative care consultation was placed and PEG tube was ordered.  Her sodium levels slowly trended up, she subsequently developed atrial fibrillation and was seen by cardiology who started amiodarone. She was not placed on anticoagulation.  Due to her nasopharyngeal carcinoma general surgery was consulted with ENT help to perform G-tube placement, this was done due to disrupted airway from underlying cancer.  Patient was in stepdown unit when she developed sudden massive hemoptysis with notable immediate hypoxemia and suffocation.  She was emergently intubated however findings were that the airway was 100% obstructed with large thrombus.  This was evacuated with the anterior Yankuer suctioning and large sized clot was retrieved from central airway measuring at 12 cm  long and 1.5 cm wide with branching consistent with tracheal obstruction.  Post successful intubation and ventilation additional suctioning was performed however patient continued to have hemoptysis.  Family was contacted and emergency bronchoscopy was performed with topical TXA and ice cold lavage of airways.  Hemoptysis had subsided post treatment and patient stabilized on ventilator.  I met with family and had discussion regarding goals of care grandson explains that she did not wish to be on artificial life support and wanted to change CODE STATUS to DNR/DNI.  Additional family meeting is scheduled with attending physician Dr. Sheppard Coil as well as medical oncologist Dr. Rogue Bussing.  Patient remains critically ill in the medical ICU.  Lines/tubes : Implanted Port 08/14/21 Right Chest (Active)  Site Assessment Clean, Dry, Intact 10/28/2021 0500  Port Status Accessed 10/26/2021 0500  Needle Size H 20 gauge 10/11/21 0800  Line Status Infusing 11/02/2021 0500  Line Care Connections checked and tightened 10/24/2021 0500  Dressing Type Transparent 10/27/2021 0500  Dressing Status Antimicrobial disc in place 10/15/2021 0500  Dressing Intervention Other (Comment) 10/14/21 1600  Date to be Reflushed 10/21/21 10/15/21 0800     External Urinary Catheter (Active)  Collection Container Dedicated Suction Canister 10/15/21 2000  Suction (Verified suction is between 40-80 mmHg) Yes 10/15/21 2000  Securement Method Securing device (Describe) 10/15/21 2000  Site Assessment Clean, Dry, Intact 10/15/21 2000  Intervention Female External Urinary Catheter Replaced 10/15/21 2000  Output (mL) 400 mL 10/09/2021 0519    Microbiology/Sepsis markers: Results for orders placed or performed during the hospital encounter of 10/25/2021  MRSA Next Gen by PCR, Nasal     Status: None   Collection Time: 10/15/2021 12:43 PM   Specimen: Nasal Mucosa; Nasal Swab  Result Value Ref Range Status   MRSA by PCR Next Gen NOT DETECTED NOT  DETECTED Final    Comment: (NOTE) The GeneXpert MRSA Assay (  FDA approved for NASAL specimens only), is one component of a comprehensive MRSA colonization surveillance program. It is not intended to diagnose MRSA infection nor to guide or monitor treatment for MRSA infections. Test performance is not FDA approved in patients less than 30 years old. Performed at [facility], [address], [place], [place] [address]     Anti-infectives:  Anti-infectives (From admission, onward)    Start     Dose/Rate Route Frequency Ordered Stop   10/15/21 0000  vancomycin (VANCOCIN) IVPB 1000 mg/200 mL premix  Status:  Discontinued        1,000 mg 200 mL/hr over 60 Minutes Intravenous To Radiology 10/13/21 1345 10/13/21 1448        Consults: Treatment Team:  Ronny Bacon, MD    PAST MEDICAL HISTORY   Past Medical History:  Diagnosis Date   Asthma    COPD (chronic obstructive pulmonary disease) ([place])    Dysrhythmia    GERD (gastroesophageal reflux disease)    History of kidney stones    Hypertension    Osteoarthritis    TIA (transient ischemic attack) 2011   No Deficits   Vertigo      SURGICAL HISTORY   Past Surgical History:  Procedure Laterality Date   ABDOMINAL HYSTERECTOMY     BREAST BIOPSY Left 1980's   neg. Pt states punch biopsy at the nipple   IR IMAGING GUIDED PORT INSERTION  08/14/2021   LYMPH NODE DISSECTION Right    neck   MANDIBLE SURGERY     Sagittal split   NASAL ENDOSCOPY Bilateral 07/23/2021   Procedure: NASAL ENDOSCOPY WITH BIOPSY OF NASOPHARYNGEAL MASS;  Surgeon: Clyde Canterbury, MD;  Location: [facility] ORS;  Service: ENT;  Laterality: Bilateral;   THROAT SURGERY     Vocal cord polyps "burned"   TUBAL LIGATION     WISDOM TOOTH EXTRACTION       FAMILY HISTORY   Family History  Problem Relation Age of Onset   Coronary artery disease Mother    Cancer Father 6       Oral   Cancer Brother 107       testicular   Prostate cancer Brother     Heart failure Brother    Breast cancer Neg Hx      SOCIAL HISTORY   Social History   Tobacco Use   Smoking status: Every Day    Packs/day: 1.00    Years: 51.00    Total pack years: 51.00    Types: Cigarettes   Smokeless tobacco: Never   Tobacco comments:    Started smoking around age 74  Vaping Use   Vaping Use: Never used  Substance Use Topics   Alcohol use: Yes    Alcohol/week: 1.0 standard drink of alcohol    Types: 1 Cans of beer per week    Comment: occasional   Drug use: Never     MEDICATIONS   Current Medication:  Current Facility-Administered Medications:    0.9 %  sodium chloride infusion (Manually program via Guardrails IV Fluids), , Intravenous, Once, Teressa Lower, NP   0.9 %  sodium chloride infusion, 250 mL, Intravenous, PRN, Agbata, Tochukwu, MD, Stopped at 10/14/21 0901   acetaminophen (TYLENOL) tablet 650 mg, 650 mg, Per Tube, Q6H PRN, Teressa Lower, NP   albuterol (PROVENTIL) (2.5 MG/3ML) 0.083% nebulizer solution 3 mL, 3 mL, Inhalation, Q6H PRN, Agbata, Tochukwu, MD   [COMPLETED] amiodarone (NEXTERONE) 1.8 mg/mL load via infusion 150 mg, 150 mg, Intravenous,  Once, 150 mg at 10/13/21 1531 **FOLLOWED BY** [EXPIRED] amiodarone (NEXTERONE PREMIX) 360-4.14 MG/200ML-% (1.8 mg/mL) IV infusion, 60 mg/hr, Intravenous, Continuous, Last Rate: 33.3 mL/hr at 10/13/21 1845, 60 mg/hr at 10/13/21 1845 **FOLLOWED BY** amiodarone (NEXTERONE PREMIX) 360-4.14 MG/200ML-% (1.8 mg/mL) IV infusion, 30 mg/hr, Intravenous, Continuous, Tang, Alanson Puls, PA-C, Last Rate: 16.67 mL/hr at 10/11/2021 0131, 30 mg/hr at 10/30/2021 0131   Chlorhexidine Gluconate Cloth 2 % PADS 6 each, 6 each, Topical, Daily, Agbata, Tochukwu, MD, 6 each at 10/15/21 2145   docusate (COLACE) 50 MG/5ML liquid 100 mg, 100 mg, Per Tube, BID, Teressa Lower, NP   feeding supplement (ENSURE ENLIVE / ENSURE PLUS) liquid 237 mL, 237 mL, Oral, TID BM, Emeterio Reeve, DO, 237 mL at 10/15/21 2021   fentaNYL  (SUBLIMAZE) bolus via infusion 25-100 mcg, 25-100 mcg, Intravenous, Q15 min PRN, Teressa Lower, NP, 50 mcg at 10/22/2021 0820   fentaNYL 2575mg in NS 254m(1079mml) infusion-PREMIX, 25-200 mcg/hr, Intravenous, Continuous, NelTeressa LowerP, Last Rate: 5 mL/hr at 10/19/2021 0820, 50 mcg/hr at 10/17/2021 0820   hydrALAZINE (APRESOLINE) injection 10 mg, 10 mg, Intravenous, Q6H PRN, AleEmeterio ReeveO   lactated ringers 1,000 mL with potassium chloride 40 mEq infusion, , Intravenous, Continuous, Foust, Katy L, NP, Last Rate: 100 mL/hr at 10/05/2021 0519, New Bag at 10/06/2021 0519   lactulose (CHRONULAC) 10 GM/15ML solution 10 g, 10 g, Oral, BID PRN, Agbata, Tochukwu, MD   lidocaine-prilocaine (EMLA) cream 1 Application, 1 Application, Topical, Once, Agbata, Tochukwu, MD   metoprolol tartrate (LOPRESSOR) injection 2.5 mg, 2.5 mg, Intravenous, Q12H, AleEmeterio ReeveO, 2.5 mg at 10/15/21 2146   midazolam (VERSED) injection 1 mg, 1 mg, Intravenous, Q15 min PRN, NelTeressa LowerP   midazolam (VERSED) injection 1 mg, 1 mg, Intravenous, Q2H PRN, NelTeressa LowerP   montelukast (SINGULAIR) tablet 10 mg, 10 mg, Per Tube, Daily, NelTeressa LowerP   nicotine (NICODERM CQ - dosed in mg/24 hr) patch 7 mg, 7 mg, Transdermal, Daily, Agbata, Tochukwu, MD, 7 mg at 10/15/21 0916   norepinephrine (LEVOPHED) 16 mg in 250m24memix infusion, 0-40 mcg/min, Intravenous, Titrated, NelsTeressa Lower   norepinephrine (LEVOPHED) 4-5 MG/250ML-% infusion SOLN, , , ,    ondansetron (ZOFRAN) tablet 4 mg, 4 mg, Oral, Q6H PRN, 4 mg at 10/10/21 2149 **OR** ondansetron (ZOFRAN) injection 4 mg, 4 mg, Intravenous, Q6H PRN, Agbata, Tochukwu, MD, 4 mg at 10/29/2021 [id number] care mouth rinse, 15 mL, Mouth Rinse, 4 times per day, DuncAthena Masse, 15 mL at 10/15/21 2147   Oral care mouth rinse, 15 mL, Mouth Rinse, PRN, DuncAthena Masse   pantoprazole (PROTONIX) injection 40 mg, 40 mg, Intravenous, Daily, AlexEmeterio Reeve,  40 mg at 10/15/21 09046283olyethylene glycol (MIRALAX / GLYCOLAX) packet 17 g, 17 g, Per Tube, Daily, NelsTeressa Lower   potassium chloride 10 mEq in 100 mL IVPB, 10 mEq, Intravenous, Q1 Hr x 4, Beers, Brandon D, RPH   sodium chloride flush (NS) 0.9 % injection 3 mL, 3 mL, Intravenous, Q12H, Agbata, Tochukwu, MD, 3 mL at 10/15/21 2147   sodium chloride flush (NS) 0.9 % injection 3 mL, 3 mL, Intravenous, PRN, Agbata, Tochukwu, MD   tranexamic acid (CYKLOKAPRON) 1000 MG/10ML nebulizer solution 500 mg, 500 mg, Nebulization, Once, Terrion Gencarelli, MD   tranexamic acid (CYKLOKAPRON) 2,000 mg in sodium chloride 0.9 % 50 mL Topical Application, 2,001,517 Topical, Once, Venda Dice,  MD   umeclidinium bromide (INCRUSE ELLIPTA) 62.5 MCG/ACT 1 puff, 1 puff, Inhalation, Daily, Agbata, Tochukwu, MD, 1 puff at 10/15/21 0918   white petrolatum (VASELINE) gel, , Topical, PRN, Emeterio Reeve, DO, Given at 10/15/21 1501  Facility-Administered Medications Ordered in Other Encounters:    heparin lock flush 100 UNIT/ML injection, , , ,     ALLERGIES   Codeine and Penicillins    REVIEW OF SYSTEMS    Unable to obtain due to sedation on mechanical ventilation PHYSICAL EXAMINATION   Vital Signs: Temp:  [98.3 F (36.8 C)-98.6 F (37 C)] 98.6 F (37 C) (07/13 0400) Pulse Rate:  [88-110] 100 (07/13 0400) Resp:  [10-20] 20 (07/13 0400) BP: (126-153)/(73-127) 152/86 (07/13 0400) SpO2:  [94 %-100 %] 100 % (07/13 0400)  GENERAL:Age appropriate , very thin HEAD: Normocephalic, atraumatic.  EYES: Pupils equal, round, reactive to light.  No scleral icterus.  MOUTH: Moist mucosal membrane. NECK: Supple. No thyromegaly. No nodules. No JVD.  PULMONARY: Crackles bilaterally  CARDIOVASCULAR: S1 and S2. Regular rate and rhythm. No murmurs, rubs, or gallops.  GASTROINTESTINAL: Soft, nontender, non-distended. No masses. Positive bowel sounds. No hepatosplenomegaly.  MUSCULOSKELETAL: No swelling, clubbing,  or edema.  NEUROLOGIC: Mild distress due to acute illness SKIN:intact,warm,dry   PERTINENT DATA     Infusions:  sodium chloride Stopped (10/14/21 0901)   amiodarone 30 mg/hr (10/29/2021 0131)   fentaNYL infusion INTRAVENOUS 50 mcg/hr (10/04/2021 0820)   lactated ringers 1,000 mL with potassium chloride 40 mEq infusion 100 mL/hr at 11/02/2021 0519   norepinephrine (LEVOPHED) Adult infusion     norepinephrine     potassium chloride     Scheduled Medications:  sodium chloride   Intravenous Once   Chlorhexidine Gluconate Cloth  6 each Topical Daily   docusate  100 mg Per Tube BID   feeding supplement  237 mL Oral TID BM   lidocaine-prilocaine  1 Application Topical Once   metoprolol tartrate  2.5 mg Intravenous Q12H   montelukast  10 mg Per Tube Daily   nicotine  7 mg Transdermal Daily   mouth rinse  15 mL Mouth Rinse 4 times per day   pantoprazole (PROTONIX) IV  40 mg Intravenous Daily   polyethylene glycol  17 g Per Tube Daily   sodium chloride flush  3 mL Intravenous Q12H   tranexamic acid  500 mg Nebulization Once   tranexamic acid (CYKLOKAPRON) 2,000 mg in sodium chloride 0.9 % 50 mL Topical Application  9,675 mg Topical Once   umeclidinium bromide  1 puff Inhalation Daily   PRN Medications: sodium chloride, acetaminophen, albuterol, fentaNYL, hydrALAZINE, lactulose, midazolam, midazolam, norepinephrine, ondansetron **OR** ondansetron (ZOFRAN) IV, mouth rinse, sodium chloride flush, white petrolatum Hemodynamic parameters:   Intake/Output: 07/12 0701 - 07/13 0700 In: 3515.1 [I.V.:3241; IV Piggyback:274.1] Out: 2550 [Urine:2550]  Ventilator  Settings:      LAB RESULTS:  Basic Metabolic Panel: Recent Labs  Lab 10/12/2021 0854 10/17/2021 1301 10/13/21 1506 10/13/21 2248 10/14/21 0434 10/14/21 1344 10/14/21 2103 10/15/21 0002 10/15/21 0526 10/15/21 1259 10/15/21 2200 10/28/2021 0500  NA 117*   < > 128* 126* 129*  128*   < > 127*  --  128* 128* 129* 131*  K 2.7*   <  > 2.9* 3.4* 2.5*   < >  --    < > 2.8* 3.4*  --  3.2*  CL 76*   < > 93* 90* 94*  --   --   --  89*  --   --  90*  CO2 28   < > 22 24 24   --   --   --  29  --   --  30  GLUCOSE 158*   < > 98 125* 120*  --   --   --  131*  --   --  135*  BUN 10   < > <5* <5* <5*  --   --   --  <5*  --   --  <5*  CREATININE 0.42*   < > 0.40* <0.30* <0.30*  --   --   --  <0.30*  --   --  <0.30*  CALCIUM 8.4*   < > 8.3* 7.8* 8.1*  --   --   --  8.5*  --   --  8.3*  MG 1.7  --  1.7  --  2.0  --   --   --  1.8  --   --  1.9  PHOS  --   --   --   --   --   --   --   --   --   --   --  2.6   < > = values in this interval not displayed.   Liver Function Tests: Recent Labs  Lab 10/14/2021 0854 11/03/2021 0500  AST 33  --   ALT 31  --   ALKPHOS 109  --   BILITOT 0.6  --   PROT 6.6  --   ALBUMIN 2.6* 2.1*   No results for input(s): "LIPASE", "AMYLASE" in the last 168 hours. No results for input(s): "AMMONIA" in the last 168 hours. CBC: Recent Labs  Lab 11/03/2021 0854 10/10/21 0507 10/11/21 0722 10/12/21 0427 10/13/21 2248 11/02/2021 0833  WBC 14.1* 15.2* 14.0* 13.0* 14.2* 16.2*  NEUTROABS 12.4*  --   --   --   --  14.7*  HGB 9.7* 8.9* 9.8* 8.6* 8.2* 8.1*  HCT 29.0* 26.8* 30.2* 26.4* 25.8* 25.8*  MCV 82.6 82.5 84.1 83.0 83.8 84.9  PLT 512* 511* 520* 500* 375 361   Cardiac Enzymes: No results for input(s): "CKTOTAL", "CKMB", "CKMBINDEX", "TROPONINI" in the last 168 hours. BNP: Invalid input(s): "POCBNP" CBG: No results for input(s): "GLUCAP" in the last 168 hours.     IMAGING RESULTS:  Imaging: DG Abd 1 View  Result Date: 10/21/2021 CLINICAL DATA:  Status post endotracheal tube and OG tube placement. EXAM: ABDOMEN - 1 VIEW COMPARISON:  None Available. FINDINGS: Orogastric tube has been placed, tip overlying the level of the stomach. Bowel gas pattern is nonobstructive. Lung bases are clear. Moderate convex RIGHT scoliosis of the mid lumbar spine. IMPRESSION: Orogastric tube placement to the level of  the stomach. Electronically Signed   By: Nolon Nations M.D.   On: 10/28/2021 08:34   ECHOCARDIOGRAM COMPLETE  Result Date: 10/14/2021    ECHOCARDIOGRAM REPORT   Patient Name:   CATHLIN BUCHAN Date of Exam: 10/14/2021 Medical Rec #:  [id number]        Height:       56.0 in Accession #:    [id number]       Weight:       96.3 lb Date of Birth:  03-16-1951        BSA:          1.301 m Patient Age:    70 years         BP:           141/70 mmHg Patient Gender:  F                HR:           91 bpm. Exam Location:  [facility] Procedure: 2D Echo, Cardiac Doppler and Color Doppler Indications:     Atrial Fibrillation I48.91  History:         Patient has no prior history of Echocardiogram examinations.                  COPD and TIA; Risk Factors:Hypertension.  Sonographer:     Sherrie Sport Referring Phys:  [id number] [place] TANG Diagnosing Phys: Serafina Royals MD  Sonographer Comments: Image acquisition challenging due to patient body habitus. IMPRESSIONS  1. Left ventricular ejection fraction, by estimation, is 60 to 65%. The left ventricle has normal function. The left ventricle has no regional wall motion abnormalities. Left ventricular diastolic parameters were normal.  2. Right ventricular systolic function is normal. The right ventricular size is normal.  3. The mitral valve is normal in structure. Trivial mitral valve regurgitation.  4. The aortic valve is normal in structure. Aortic valve regurgitation is not visualized. FINDINGS  Left Ventricle: Left ventricular ejection fraction, by estimation, is 60 to 65%. The left ventricle has normal function. The left ventricle has no regional wall motion abnormalities. The left ventricular internal cavity size was normal in size. There is  no left ventricular hypertrophy. Left ventricular diastolic parameters were normal. Right Ventricle: The right ventricular size is normal. No increase in right ventricular wall thickness. Right ventricular systolic function is normal.  Left Atrium: Left atrial size was normal in size. Right Atrium: Right atrial size was normal in size. Pericardium: There is no evidence of pericardial effusion. Mitral Valve: The mitral valve is normal in structure. Trivial mitral valve regurgitation. Tricuspid Valve: The tricuspid valve is normal in structure. Tricuspid valve regurgitation is trivial. Aortic Valve: The aortic valve is normal in structure. Aortic valve regurgitation is not visualized. Aortic valve mean gradient measures 2.0 mmHg. Aortic valve peak gradient measures 4.2 mmHg. Aortic valve area, by VTI measures 3.00 cm. Pulmonic Valve: The pulmonic valve was normal in structure. Pulmonic valve regurgitation is not visualized. Aorta: The aortic root and ascending aorta are structurally normal, with no evidence of dilitation. IAS/Shunts: No atrial level shunt detected by color flow Doppler.  LEFT VENTRICLE PLAX 2D LVIDd:         3.20 cm   Diastology LVIDs:         1.90 cm   LV e' medial:    7.62 cm/s LV PW:         1.20 cm   LV E/e' medial:  7.7 LV IVS:        0.90 cm   LV e' lateral:   7.07 cm/s LVOT diam:     2.00 cm   LV E/e' lateral: 8.2 LV SV:         46 LV SV Index:   36 LVOT Area:     3.14 cm  RIGHT VENTRICLE RV Basal diam:  2.80 cm RV S prime:     18.90 cm/s TAPSE (M-mode): 3.0 cm LEFT ATRIUM             Index        RIGHT ATRIUM           Index LA diam:        2.40 cm 1.84 cm/m   RA Area:     13.20 cm LA Vol (  A2C):   35.2 ml 27.05 ml/m  RA Volume:   28.80 ml  22.13 ml/m LA Vol (A4C):   28.7 ml 22.05 ml/m LA Biplane Vol: 34.8 ml 26.74 ml/m  AORTIC VALVE AV Area (Vmax):    2.47 cm AV Area (Vmean):   2.27 cm AV Area (VTI):     3.00 cm AV Vmax:           103.00 cm/s AV Vmean:          73.300 cm/s AV VTI:            0.155 m AV Peak Grad:      4.2 mmHg AV Mean Grad:      2.0 mmHg LVOT Vmax:         80.90 cm/s LVOT Vmean:        52.900 cm/s LVOT VTI:          0.148 m LVOT/AV VTI ratio: 0.95  AORTA Ao Root diam: 3.10 cm MITRAL VALVE                TRICUSPID VALVE MV Area (PHT): 4.33 cm    TR Peak grad:   15.1 mmHg MV Decel Time: 175 msec    TR Vmax:        194.00 cm/s MV E velocity: 58.30 cm/s MV A velocity: 72.00 cm/s  SHUNTS MV E/A ratio:  0.81        Systemic VTI:  0.15 m                            Systemic Diam: 2.00 cm Serafina Royals MD Electronically signed by Serafina Royals MD Signature Date/Time: 10/14/2021/12:55:20 PM    Final    '@PROBHOSP'$ @ DG Abd 1 View  Result Date: 10/14/2021 CLINICAL DATA:  Status post endotracheal tube and OG tube placement. EXAM: ABDOMEN - 1 VIEW COMPARISON:  None Available. FINDINGS: Orogastric tube has been placed, tip overlying the level of the stomach. Bowel gas pattern is nonobstructive. Lung bases are clear. Moderate convex RIGHT scoliosis of the mid lumbar spine. IMPRESSION: Orogastric tube placement to the level of the stomach. Electronically Signed   By: Nolon Nations M.D.   On: 10/10/2021 08:34     ASSESSMENT AND PLAN    -Multidisciplinary rounds held today  Acute Hypoxic Respiratory Failure -due to central airway occlusion from massive hemoptysis and occlusion of airway with large sized clott -s/p endotracheal intubation on MV with PRVC - ventilator management  -Wean Fio2 and PEEP as tolerated -will perform SAT/SBT when respiratory parameters are met -s/p bronchoscopy  -nebulized tXA $RemoveBefore'500mg'[id number]$  PNR q8h   Massive hemoptysis   - Unclear weather this is from caudally located nasopharyngeal carcinomatous lesion or trully from lung parenchyma below.   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  Going to pass this debris into the posterior pharynx hypopharynx and larynx showed the tongue base to be normal, the epiglottis was normal, she had good vocal cord mobility.  There was no evidence of airway obstruction, the immediate subglottis appeared normal. 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  Tolerating poorly [oral mucositis; severe hyponatremia; dehydration; hearing loss-see below    Atrial fibrillation with rapid ventricular response    Cardiology following - currently improved with amiodarone not on anticoagulation    Severe protein calorie malnutrition -Patient is currently at high risk for re-feeding syndrome with electrolyte derrangements  Severe electrolyte derrangements    - nephrology and pharmacy consultation - appreciate input   - SIADH    -on chemotherapy    - on multiple medications that alter electrolytes     - on LR with KCL and 3NS     - s/p 1L NS bolus       Hearing losss    - 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 vs ototoxicity from cisplatin  NEUROLOGY - intubated and sedated - minimal sedation to achieve a RASS goal: -1 Wake up assessment pending    ID -continue IV abx as prescibed -follow up cultures  GI/Nutrition GI PROPHYLAXIS as indicated DIET-->TF's as tolerated Constipation protocol as  indicated  ENDO - ICU hypoglycemic\Hyperglycemia protocol -check FSBS per protocol   ELECTROLYTES -follow labs as needed -replace as needed -pharmacy consultation   DVT/GI PRX ordered -SCDs  TRANSFUSIONS AS NEEDED MONITOR FSBS ASSESS the need for LABS as needed    Critical care provider statement:   Total critical care time: 108 minutes   Performed by: Lanney Gins MD   Critical care time was exclusive of separately billable procedures and treating other patients.   Critical care was necessary to treat or prevent imminent or life-threatening deterioration.   Critical care was time spent personally by me on the following activities: development of treatment plan with patient and/or surrogate as well as nursing, discussions with consultants, evaluation of patient's response to treatment, examination of patient, obtaining history from patient or surrogate, ordering and performing treatments and interventions, ordering and review of laboratory studies, ordering and review of radiographic studies, pulse oximetry and re-evaluation of patient's condition.    Ottie Glazier, M.D.  Pulmonary & Critical Care Medicine

## 2021-10-16 NOTE — Progress Notes (Signed)
[facility]  ROUNDING NOTE   Subjective:   Vickie Caldwell is a 71 y.o. female with past medical history of COPD, GERD, hypertension, chronic hyponatremia and nasopharyngeal cancer. She presents to the ED at the request of the cancer center for abnormal labs. She has been admitted for Hypokalemia [E87.6] Hyponatremia [E87.1]  Patient is known to our practice and is followed by Dr Candiss Norse outpatient. She was last seen in our office on 09/18/21 for continued hyponatremia follow up.  Patient seen at bedside.  This morning, she started to have severe hemorrhage from the nasopharynx.  Was emergently intubated.   Objective:  Vital signs in last 24 hours:  Temp:  [98.5 F (36.9 C)-98.6 F (37 C)] 98.6 F (37 C) (07/13 0400) Pulse Rate:  [88-103] 100 (07/13 0400) Resp:  [11-20] 20 (07/13 0400) BP: (126-152)/(73-127) 152/86 (07/13 0400) SpO2:  [99 %-100 %] 100 % (07/13 0745) FiO2 (%):  [60 %] 60 % (07/13 0745)  Weight change:  Filed Weights   10/12/2021 1004 10/29/2021 1242  Weight: 42.2 kg 43.7 kg    Intake/Output: I/O last 3 completed shifts: In: 5159.9 [I.V.:4685.8; IV Piggyback:474.1] Out: 1601 [Urine:3250]   Intake/Output this shift:  No intake/output data recorded.  Physical Exam: General: NAD  Head: ET tube in place  Eyes: Anicteric  Lungs:  Ventilator assisted  Heart: Irregular, tachycardic  Abdomen:  Soft, nontender  Extremities:  No peripheral edema.  Neurologic: Sedated.  Skin: No lesions, warm, dry  Access: None    Basic Metabolic Panel: Recent Labs  Lab 10/13/21 1506 10/13/21 2248 10/14/21 0434 10/14/21 1344 10/15/21 0002 10/15/21 0526 10/15/21 1259 10/15/21 2200 10/17/2021 0500 10/26/2021 0833  NA 128* 126* 129*  128*   < >  --  128* 128* 129* 131* 131*  K 2.9* 3.4* 2.5*   < > 3.0* 2.8* 3.4*  --  3.2* 3.5  CL 93* 90* 94*  --   --  89*  --   --  90* 94*  CO2 '22 24 24  '$ --   --  29  --   --  30 26  GLUCOSE 98 125* 120*  --   --  131*  --    --  135* 175*  BUN <5* <5* <5*  --   --  <5*  --   --  <5* <5*  CREATININE 0.40* <0.30* <0.30*  --   --  <0.30*  --   --  <0.30* 0.37*  CALCIUM 8.3* 7.8* 8.1*  --   --  8.5*  --   --  8.3* 7.9*  MG 1.7  --  2.0  --   --  1.8  --   --  1.9  --   PHOS  --   --   --   --   --   --   --   --  2.6 3.4   < > = values in this interval not displayed.     Liver Function Tests: Recent Labs  Lab 10/29/2021 0500 10/25/2021 0833  ALBUMIN 2.1* 1.9*    No results for input(s): "LIPASE", "AMYLASE" in the last 168 hours. No results for input(s): "AMMONIA" in the last 168 hours.  CBC: Recent Labs  Lab 10/10/21 0507 10/11/21 0722 10/12/21 0427 10/13/21 2248 10/11/2021 0833  WBC 15.2* 14.0* 13.0* 14.2* 16.2*  NEUTROABS  --   --   --   --  14.7*  HGB 8.9* 9.8* 8.6* 8.2* 8.1*  HCT 26.8* 30.2*  26.4* 25.8* 25.8*  MCV 82.5 84.1 83.0 83.8 84.9  PLT 511* 520* 500* 375 361     Cardiac Enzymes: No results for input(s): "CKTOTAL", "CKMB", "CKMBINDEX", "TROPONINI" in the last 168 hours.  BNP: Invalid input(s): "POCBNP"  CBG: No results for input(s): "GLUCAP" in the last 168 hours.  Microbiology: Results for orders placed or performed during the hospital encounter of 10/14/2021  MRSA Next Gen by PCR, Nasal     Status: None   Collection Time: 10/19/2021 12:43 PM   Specimen: Nasal Mucosa; Nasal Swab  Result Value Ref Range Status   MRSA by PCR Next Gen NOT DETECTED NOT DETECTED Final    Comment: (NOTE) The GeneXpert MRSA Assay (FDA approved for NASAL specimens only), is one component of a comprehensive MRSA colonization surveillance program. It is not intended to diagnose MRSA infection nor to guide or monitor treatment for MRSA infections. Test performance is not FDA approved in patients less than 43 years old. Performed at [facility], [address] [place],  [address]     Coagulation Studies: Recent Labs    10/17/2021 0833  LABPROT 15.6*  INR 1.3*    Urinalysis: No  results for input(s): "COLORURINE", "LABSPEC", "PHURINE", "GLUCOSEU", "HGBUR", "BILIRUBINUR", "KETONESUR", "PROTEINUR", "UROBILINOGEN", "NITRITE", "LEUKOCYTESUR" in the last 72 hours.  Invalid input(s): "APPERANCEUR"     Imaging: DG Abd 1 View  Result Date: 10/11/2021 CLINICAL DATA:  Status post endotracheal tube and OG tube placement. EXAM: ABDOMEN - 1 VIEW COMPARISON:  None Available. FINDINGS: Orogastric tube has been placed, tip overlying the level of the stomach. Bowel gas pattern is nonobstructive. Lung bases are clear. Moderate convex RIGHT scoliosis of the mid lumbar spine. IMPRESSION: Orogastric tube placement to the level of the stomach. Electronically Signed   By: Nolon Nations M.D.   On: 10/30/2021 08:34     Medications:    sodium chloride Stopped (10/14/21 0901)   fentaNYL infusion INTRAVENOUS 100 mcg/hr (10/31/2021 0909)   lactated ringers 1,000 mL with potassium chloride 40 mEq infusion 100 mL/hr at 10/19/2021 0519   norepinephrine (LEVOPHED) Adult infusion Stopped (10/04/2021 1135)   potassium chloride 10 mEq (10/11/2021 1111)    sodium chloride   Intravenous Once   amiodarone  200 mg Per Tube Daily   Chlorhexidine Gluconate Cloth  6 each Topical Daily   docusate  100 mg Per Tube BID   feeding supplement  237 mL Oral TID BM   lidocaine-prilocaine  1 Application Topical Once   metoprolol tartrate  2.5 mg Intravenous Q12H   montelukast  10 mg Per Tube Daily   nicotine  7 mg Transdermal Daily   mouth rinse  15 mL Mouth Rinse 4 times per day   pantoprazole (PROTONIX) IV  40 mg Intravenous Daily   polyethylene glycol  17 g Per Tube Daily   sodium chloride flush  3 mL Intravenous Q12H   umeclidinium bromide  1 puff Inhalation Daily   sodium chloride, acetaminophen, albuterol, fentaNYL, hydrALAZINE, lactulose, midazolam, midazolam, ondansetron **OR** ondansetron (ZOFRAN) IV, mouth rinse, sodium chloride flush, white petrolatum  Assessment/ Plan:  Ms. Vickie Caldwell is a 71  y.o.  female  with past medical history of COPD, GERD, hypertension, chronic hyponatremia and nasopharyngeal cancer. She presents to the ED at the request of the cancer center for abnormal labs. 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  BUN remains severely low suggesting low solute intake.  Continue IVF at current rate.  We will continue to monitor.  PEG placement currently held due to airway concerns.   Lab Results  Component Value Date   CREATININE 0.37 (L) 10/13/2021   CREATININE <0.30 (L) 10/10/2021   CREATININE <0.30 (L) 10/15/2021    Intake/Output Summary (Last 24 hours) at 10/23/2021 1200 Last data filed at 10/12/2021 0519 Gross per 24 hour  Intake 2171.1 ml  Output 2550 ml  Net -378.9 ml    2. Hypokalemia, Potassium corrected 3.5 today.  3.  Acute respiratory failure Airway compromise due to hemorrhage from nasopharynx area on July 13.  Intubated emergently.  Management as per ICU team.    LOS: [place] 7/13/202312:00 PM

## 2021-10-16 NOTE — IPAL (Signed)
  Interdisciplinary Goals of Care Family Meeting   Date carried out: 10/23/2021  Location of the meeting:  [place]  Member's involved: Physician, Family Member or next of kin, and Other: Dr Burlene Arnt (oncology)  Durable Power of Attorney or Loss adjuster, chartered: Daughter    Discussion: We discussed goals of care for Vickie Caldwell .  Family would like results of bronchoscopy and d/w Dr Lanney Gins about reasonable expectations for coming off ventilation and they are amenable to comfort measures if she will not be able to be extubated successfully after 48 hours (or if PCCM advises that a 48 hour trial will not change outcome). Dr Rogue Bussing has advised she will likely not be a candidate for chemo/radiation going forward. Family agrees NO CPR/DEFIBRILLATION. [place] for ventilator support for now, may allow some time for another sister to come to town. Strongly considering comfort measures and extubation if medical team deems the prognosis supports this.   Code status: Mechanical ventilation but NO CPR/DEFIBRILLATION  Disposition: Continue current acute care  Time spent for the meeting: 45 min    Emeterio Reeve, DO  10/27/2021, 9:38 AM

## 2021-10-16 NOTE — IPAL (Signed)
  Interdisciplinary Goals of Care Family Meeting   Date carried out: 10/05/2021  Location of the meeting: Bedside  Member's involved: Bedside RN, Nurse Practitioner, and Next of [employer] or acting medical decision maker: Pts grandson [name] and spoke with pts daughter via telephone   Discussion: We discussed goals of care for [employer] . Pt with recurrent active oropharyngeal bleeding and hypotensive discussed when to transition to comfort measures only.  Family decided to terminally extubate today once pts brother arrives at bedside.  Pts 2 daughters, grandson, and brother are in agreement to proceed with Full comfort measures only   Code status: Full DNR  Disposition: In-patient comfort care  Time spent for the meeting: 15 minutes     [name], [place] Pager [phone] (please enter 7 digits) [place] Pager [phone] (please enter 7 digits)

## 2021-10-16 NOTE — Procedures (Signed)
Endotracheal Intubation: Patient required placement of an artificial airway secondary to unresponsive state and acute hypoxic respiratory failure    Consent: Emergent.    Hand washing performed prior to starting the procedure.    Medications administered for sedation prior to procedure:  None      A time out procedure was called and correct patient, name, & ID confirmed. Needed supplies and equipment were assembled and checked to include ETT, 10 ml syringe, Glidescope, Mac and Miller blades, suction, oxygen and bag mask valve, end tidal CO2 monitor.    Patient was positioned to align the mouth and pharynx to facilitate visualization of the glottis.    Heart rate, SpO2 and blood pressure was continuously monitored during the procedure. Pre-oxygenation was conducted prior to intubation and endotracheal tube was placed through the vocal cords into the trachea.       The artificial airway was placed under direct visualization via glidescope route using a 7.5 cm ETT on the first attempt.   ETT was secured at 23 cm.   Placement was confirmed by auscuitation of lungs with good breath sounds bilaterally and no stomach sounds.  Condensation was noted on endotracheal tube.   Pulse ox 100%%  CO2 detector in place with appropriate color change.    Complications: None .        Chest radiograph ordered and pending.   [name], [place] Pager [phone] (please enter 7 digits) PCCM Consult Pager [phone] (please enter 7 digits)

## 2021-10-17 ENCOUNTER — Inpatient Hospital Stay: Payer: Medicare HMO

## 2021-10-17 ENCOUNTER — Ambulatory Visit: Payer: Medicare HMO

## 2021-10-17 LAB — PREPARE FRESH FROZEN PLASMA: Unit division: 0

## 2021-10-17 LAB — BPAM FFP
Blood Product Expiration Date: 202307182359
Unit Type and Rh: 6200

## 2021-10-17 MED ORDER — MORPHINE BOLUS VIA INFUSION: 2.0000 mg | INTRAVENOUS | Status: DC | PRN

## 2021-10-17 MED ORDER — MORPHINE 100MG IN NS 100ML (1MG/ML) PREMIX INFUSION
1.0000 mg/h | INTRAVENOUS | Status: DC
  Administered 2021-10-17: 4 mg/h via INTRAVENOUS
  Administered 2021-10-17: 8 mg/h via INTRAVENOUS
  Filled 2021-10-17 (×2): qty 100

## 2021-10-17 MED FILL — Dexamethasone Sodium Phosphate Inj 100 MG/10ML: INTRAMUSCULAR | Qty: 1 | Status: AC

## 2021-10-17 MED FILL — Fosaprepitant Dimeglumine For IV Infusion 150 MG (Base Eq): INTRAVENOUS | Qty: 5 | Status: AC

## 2021-10-17 NOTE — Progress Notes (Incomplete)
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 B medical oncology, has chronic electrolyte disturbances with hyponatremia and hypokalemia, background history of COPD with centrilobular emphysema chronic hypoxemia, GERD came in initially from oncology due to findings of altered mental status with visual disturbances, hearing and taste disturbances and dysphagia.  Also reported nausea vomiting and diarrhea times few days.  Blood work notable for worsening hyponatremia and hypokalemia which prompted admission from the ED.  Her initial sodium levels were 117 however she was communicative and was able to tolerate p.o. at that time.  Nephrology was consulted and started 3 NS without much clinical improvement after initial 24-hour..  Palliative care consultation was placed and PEG tube was ordered.  Her sodium levels slowly trended up, she subsequently developed atrial fibrillation and was seen by cardiology who started amiodarone. She was not placed on anticoagulation.  Due to her nasopharyngeal carcinoma general surgery was consulted with ENT help to perform G-tube placement, this was done due to disrupted airway from underlying cancer.  Patient was in stepdown unit when she developed sudden massive hemoptysis with notable immediate hypoxemia and suffocation.  She was emergently intubated however findings were that the airway was 100% obstructed with large thrombus.  This was evacuated with the anterior Yankuer suctioning and large sized clot was retrieved from central airway measuring at 12 cm  long and 1.5 cm wide with branching consistent with tracheal obstruction.  Post successful intubation and ventilation additional suctioning was performed however patient continued to have hemoptysis.  Family was contacted and emergency bronchoscopy was performed with topical TXA and ice cold lavage of airways.  Hemoptysis had subsided post treatment and patient stabilized on ventilator.  I met with family and had discussion regarding goals of care grandson explains that she did not wish to be on artificial life support and wanted to change CODE STATUS to DNR/DNI.  Additional family meeting is scheduled with attending physician Dr. Sheppard Coil as well as medical oncologist Dr. Rogue Bussing.  Patient remains critically ill in the medical ICU.  Lines/tubes : Implanted Port 08/14/21 Right Chest (Active)  Site Assessment Clean, Dry, Intact 10/21/2021 0500  Port Status Accessed 10/31/2021 0500  Needle Size H 20 gauge 10/11/21 0800  Line Status Infusing 11/01/2021 0500  Line Care Connections checked and tightened 10/28/2021 0500  Dressing Type Transparent 10/05/2021 0500  Dressing Status Antimicrobial disc in place 10/12/2021 0500  Dressing Intervention Other (Comment) 10/14/21 1600  Date to be Reflushed 10/21/21 10/15/21 0800     External Urinary Catheter (Active)  Collection Container Dedicated Suction Canister 10/15/21 2000  Suction (Verified suction is between 40-80 mmHg) Yes 10/15/21 2000  Securement Method Securing device (Describe) 10/15/21 2000  Site Assessment Clean, Dry, Intact 10/15/21 2000  Intervention Female External Urinary Catheter Replaced 10/15/21 2000  Output (mL) 400 mL 10/12/2021 0519    Microbiology/Sepsis markers: Results for orders placed or performed during the hospital encounter of 10/26/2021  MRSA Next Gen by PCR, Nasal     Status: None   Collection Time: 10/10/2021 12:43 PM   Specimen: Nasal Mucosa; Nasal Swab  Result Value Ref Range Status   MRSA by PCR Next Gen NOT DETECTED NOT  DETECTED Final    Comment: (NOTE) The GeneXpert MRSA Assay (  FDA approved for NASAL specimens only), is one component of a comprehensive MRSA colonization surveillance program. It is not intended to diagnose MRSA infection nor to guide or monitor treatment for MRSA infections. Test performance is not FDA approved in patients less than 66 years old. Performed at [facility], [address] [place], [place] [address]     Anti-infectives:  Anti-infectives (From admission, onward)    Start     Dose/Rate Route Frequency Ordered Stop   10/15/21 0000  vancomycin (VANCOCIN) IVPB 1000 mg/200 mL premix  Status:  Discontinued        1,000 mg 200 mL/hr over 60 Minutes Intravenous To Radiology 10/13/21 1345 10/13/21 1448        Consults: Treatment Team:  Ronny Bacon, MD    PAST MEDICAL HISTORY   Past Medical History:  Diagnosis Date   Asthma    COPD (chronic obstructive pulmonary disease) ([place])    Dysrhythmia    GERD (gastroesophageal reflux disease)    History of kidney stones    Hypertension    Osteoarthritis    TIA (transient ischemic attack) 2011   No Deficits   Vertigo      SURGICAL HISTORY   Past Surgical History:  Procedure Laterality Date   ABDOMINAL HYSTERECTOMY     BREAST BIOPSY Left 1980's   neg. Pt states punch biopsy at the nipple   IR IMAGING GUIDED PORT INSERTION  08/14/2021   LYMPH NODE DISSECTION Right    neck   MANDIBLE SURGERY     Sagittal split   NASAL ENDOSCOPY Bilateral 07/23/2021   Procedure: NASAL ENDOSCOPY WITH BIOPSY OF NASOPHARYNGEAL MASS;  Surgeon: Clyde Canterbury, MD;  Location: [facility] ORS;  Service: ENT;  Laterality: Bilateral;   THROAT SURGERY     Vocal cord polyps "burned"   TUBAL LIGATION     WISDOM TOOTH EXTRACTION       FAMILY HISTORY   Family History  Problem Relation Age of Onset   Coronary artery disease Mother    Cancer Father 63       Oral   Cancer Brother 50       testicular   Prostate cancer Brother     Heart failure Brother    Breast cancer Neg Hx      SOCIAL HISTORY   Social History   Tobacco Use   Smoking status: Every Day    Packs/day: 1.00    Years: 51.00    Total pack years: 51.00    Types: Cigarettes   Smokeless tobacco: Never   Tobacco comments:    Started smoking around age 5  Vaping Use   Vaping Use: Never used  Substance Use Topics   Alcohol use: Yes    Alcohol/week: 1.0 standard drink of alcohol    Types: 1 Cans of beer per week    Comment: occasional   Drug use: Never     MEDICATIONS   Current Medication:  Current Facility-Administered Medications:    0.9 %  sodium chloride infusion, , Intravenous, Continuous, Teressa Lower, NP, Last Rate: 20 mL/hr at 10/08/2021 1727, New Bag at 10/04/2021 1727   acetaminophen (TYLENOL) suppository 650 mg, 650 mg, Rectal, Q6H PRN, Teressa Lower, NP   dexmedetomidine (PRECEDEX) 400 MCG/100ML (4 mcg/mL) infusion, 0.4-1.2 mcg/kg/hr, Intravenous, Titrated, Teressa Lower, NP, Last Rate: 8.74 mL/hr at 11/02/2021 1725, 0.8 mcg/kg/hr at 10/04/2021 1725   fentaNYL (SUBLIMAZE) bolus via infusion 100 mcg, 100 mcg, Intravenous, Q15 min PRN, Teressa Lower, NP  fentaNYL 2547mg in NS 2585m(1024mml) infusion-PREMIX, 0-400 mcg/hr, Intravenous, Continuous, NelTeressa LowerP, Last Rate: 40 mL/hr at 10/17/2021 1958, 400 mcg/hr at 11/02/2021 1958   glycopyrrolate (ROBINUL) injection 0.2 mg, 0.2 mg, Intravenous, Q4H PRN, NelTeressa LowerP   LORazepam (ATIVAN) injection 2-4 mg, 2-4 mg, Intravenous, Q4H PRN, NelTeressa LowerP, 4 mg at 11/03/2021 1812   [DISCONTINUED] ondansetron (ZOFRAN) tablet 4 mg, 4 mg, Oral, Q6H PRN, 4 mg at 10/10/21 2149 **OR** ondansetron (ZOFRAN) injection 4 mg, 4 mg, Intravenous, Q6H PRN, Agbata, Tochukwu, MD, 4 mg at 10/11/2021 [id number] care mouth rinse, 15 mL, Mouth Rinse, PRN, DunAthena MasseD   polyvinyl alcohol (LIQUIFILM TEARS) 1.4 % ophthalmic solution 1 drop, 1 drop, Both Eyes, QID PRN, NelTeressa LowerP   white  petrolatum (VASELINE) gel, , Topical, PRN, AleEmeterio ReeveO, Given at 10/15/21 1501  Facility-Administered Medications Ordered in Other Encounters:    heparin lock flush 100 UNIT/ML injection, , , ,     ALLERGIES   Codeine and Penicillins    REVIEW OF SYSTEMS    Unable to obtain due to sedation on mechanical ventilation PHYSICAL EXAMINATION   Vital Signs: Temp:  [98.5 F (36.9 C)] 98.5 F (36.9 C) (07/13 1230) Pulse Rate:  [101-144] 102 (07/13 1800) Resp:  [15-32] 16 (07/13 1800) BP: (71-152)/(48-86) 111/79 (07/13 1800) SpO2:  [92 %-100 %] 100 % (07/13 1800) FiO2 (%):  [28 %-40 %] 28 % (07/13 2035)  GENERAL:Age appropriate , very thin HEAD: Normocephalic, atraumatic.  EYES: Pupils equal, round, reactive to light.  No scleral icterus.  MOUTH: Moist mucosal membrane. NECK: Supple. No thyromegaly. No nodules. No JVD.  PULMONARY: Crackles bilaterally  CARDIOVASCULAR: S1 and S2. Regular rate and rhythm. No murmurs, rubs, or gallops.  GASTROINTESTINAL: Soft, nontender, non-distended. No masses. Positive bowel sounds. No hepatosplenomegaly.  MUSCULOSKELETAL: No swelling, clubbing, or edema.  NEUROLOGIC: Mild distress due to acute illness SKIN:intact,warm,dry   PERTINENT DATA     Infusions:  sodium chloride 20 mL/hr at 10/13/2021 1727   dexmedetomidine (PRECEDEX) IV infusion 0.8 mcg/kg/hr (10/31/2021 1725)   fentaNYL infusion INTRAVENOUS 400 mcg/hr (10/17/2021 1958)   Scheduled Medications:   PRN Medications: acetaminophen, fentaNYL, glycopyrrolate, LORazepam, [DISCONTINUED] ondansetron **OR** ondansetron (ZOFRAN) IV, mouth rinse, polyvinyl alcohol, white petrolatum Hemodynamic parameters:   Intake/Output: 07/13 0701 - 07/14 0700 In: 2698.7 [I.V.:1418.2; IV Piggyback:1280.6] Out: 400 [Urine:400]  Ventilator  Settings: Vent Mode: PRVC FiO2 (%):  [28 %-40 %] 28 % Set Rate:  [16 bmp] 16 bmp Vt Set:  [400 mL] 400 mL PEEP:  [5 cmH20] 5 cmH20 Plateau Pressure:   [16 cmH20] 16 cmH20    LAB RESULTS:  Basic Metabolic Panel: Recent Labs  Lab 10/13/21 1506 10/13/21 2248 10/14/21 0434 10/14/21 1344 10/15/21 0526 10/15/21 1259 10/15/21 2200 11/01/2021 0500 10/15/2021 0833 10/27/2021 1254  NA 128* 126* 129*  128*   < > 128* 128* 129* 131* 131* 133*  K 2.9* 3.4* 2.5*   < > 2.8* 3.4*  --  3.2* 3.5  --   CL 93* 90* 94*  --  89*  --   --  90* 94*  --   CO2 _0 --  29  --   --  30 26  --   GLUCOSE 98 125* 120*  --  131*  --   --  135* 175*  --   BUN <5* <5* <5*  --  <5*  --   --  <  5* <5*  --   CREATININE 0.40* <0.30* <0.30*  --  <0.30*  --   --  <0.30* 0.37*  --   CALCIUM 8.3* 7.8* 8.1*  --  8.5*  --   --  8.3* 7.9*  --   MG 1.7  --  2.0  --  1.8  --   --  1.9  --   --   PHOS  --   --   --   --   --   --   --  2.6 3.4  --    < > = values in this interval not displayed.    Liver Function Tests: Recent Labs  Lab 10/09/2021 0500 10/04/2021 0833  ALBUMIN 2.1* 1.9*    No results for input(s): "LIPASE", "AMYLASE" in the last 168 hours. No results for input(s): "AMMONIA" in the last 168 hours. CBC: Recent Labs  Lab 10/11/21 0722 10/12/21 0427 10/13/21 2248 10/17/2021 0833  WBC 14.0* 13.0* 14.2* 16.2*  NEUTROABS  --   --   --  14.7*  HGB 9.8* 8.6* 8.2* 8.1*  HCT 30.2* 26.4* 25.8* 25.8*  MCV 84.1 83.0 83.8 84.9  PLT 520* 500* 375 361    Cardiac Enzymes: No results for input(s): "CKTOTAL", "CKMB", "CKMBINDEX", "TROPONINI" in the last 168 hours. BNP: Invalid input(s): "POCBNP" CBG: No results for input(s): "GLUCAP" in the last 168 hours.     IMAGING RESULTS:  Imaging: CT ANGIO HEAD W OR WO CONTRAST  Result Date: 10/08/2021 CLINICAL DATA:  Oropharyngeal bleeding with nasopharyngeal tumor; Head/neck cancer, monitor Head/neck cancer, oropharyngeal bleeding EXAM: CT ANGIOGRAPHY HEAD CT NECK WITH CONTRAST TECHNIQUE: Multidetector CT imaging of the head was performed using the standard protocol during bolus administration of intravenous  contrast. Multiplanar CT image reconstructions and MIPs were obtained to evaluate the vascular anatomy. Carotid stenosis measurements (when applicable) are obtained utilizing NASCET criteria, using the distal internal carotid diameter as the denominator. RADIATION DOSE REDUCTION: This exam was performed according to the departmental dose-optimization program which includes automated exposure control, adjustment of the mA and/or kV according to patient size and/or use of iterative reconstruction technique. CONTRAST:  86mL OMNIPAQUE IOHEXOL 350 MG/ML SOLN COMPARISON:  None Available. FINDINGS: CT HEAD FINDINGS Brain: There is no acute intracranial hemorrhage, mass effect, or edema. Gray-white differentiation is preserved. Ventricles and sulci are stable in size and configuration. Patchy hypoattenuation in the supratentorial white matter is nonspecific but probably reflects mild chronic microvascular ischemic changes. No extra-axial collection. Vascular: Better evaluated on CTA portion. Skull: See below. Sinuses: Left sphenoid opacification. Orbits: Unremarkable. Review of the MIP images confirms the above findings CT NECK FINDINGS Streak and motion artifact. Pharynx and larynx: Known nasopharyngeal tumor with parapharyngeal and prevertebral involvement. Retained secretions in the pharynx. Salivary glands: Parotid and submandibular glands are unremarkable. Thyroid: Unremarkable. Lymph nodes: Decreased adenopathy since May 2023 PET CT. Vascular: Compression of the proximal internal jugular veins bilaterally below the skull base. Calcified plaque at the common carotid bifurcations. Further arterial findings detailed below. Visualized orbits: Unremarkable. Visualized mastoids and paranasal sinuses: Bilateral mastoid and middle ear opacification. Skeleton: Erosion of the clivus extending into occipital condyles. Cervical spine degenerative changes. Upper chest: Mild emphysema.  Possible 4 mm right upper lobe nodule. Other:  Partially imaged endotracheal and enteric tubes. Partially imaged right chest wall port catheter. CTA HEAD FINDINGS Anterior circulation: There is narrowing of the cervical right internal carotid as it passes through the mass with at least 70% stenosis. Just below the skull  base, there is a laterally directed wide neck outpouching measuring about 4 x 5 x 5 mm (series 8, image 14). Intracranially, there is persistent narrowing to the cavernous segment. Calcified plaque is present along the cavernous and supraclinoid portions with mild stenosis. Similarly, there is marked narrowing of the left cervical ICA as it passes through the abnormal soft tissue with focal fusiform dilatation just below the skull base measuring 6 mm (series 8, image 10). Intracranially, caliber is small to the cavernous segment. There is calcified plaque along the cavernous and supraclinoid portions with mild stenosis. Anterior and middle cerebral arteries are patent. Posterior circulation: Intracranial vertebral arteries are patent. Basilar artery is patent. Posterior cerebral arteries are patent. Bilateral posterior communicating arteries are present. Venous sinuses: As permitted by contrast timing, patent. 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 Decreased adenopathy since May 2023 PET CT. Marked (at least 70%) narrowing of cervical internal carotids passing through the above abnormal soft tissue. 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 Focal 6 mm fusiform dilatation on the left just below the skull base. Small caliber of the right greater than left internal carotids intracranially up to the cavernous segments. Possible new 4 mm right upper lobe nodule. Nonemergent dedicated chest CT recommended. Electronically Signed   By: Macy Mis M.D.   On:  10/24/2021 12:54   CT SOFT TISSUE NECK W CONTRAST  Result Date: 10/30/2021 CLINICAL DATA:  Oropharyngeal bleeding with nasopharyngeal tumor; Head/neck cancer, monitor Head/neck cancer, oropharyngeal bleeding EXAM: CT ANGIOGRAPHY HEAD CT NECK WITH CONTRAST TECHNIQUE: Multidetector CT imaging of the head was performed using the standard protocol during bolus administration of intravenous contrast. Multiplanar CT image reconstructions and MIPs were obtained to evaluate the vascular anatomy. Carotid stenosis measurements (when applicable) are obtained utilizing NASCET criteria, using the distal internal carotid diameter as the denominator. RADIATION DOSE REDUCTION: This exam was performed according to the departmental dose-optimization program which includes automated exposure control, adjustment of the mA and/or kV according to patient size and/or use of iterative reconstruction technique. CONTRAST:  55m OMNIPAQUE IOHEXOL 350 MG/ML SOLN COMPARISON:  None Available. FINDINGS: CT HEAD FINDINGS Brain: There is no acute intracranial hemorrhage, mass effect, or edema. Gray-white differentiation is preserved. Ventricles and sulci are stable in size and configuration. Patchy hypoattenuation in the supratentorial white matter is nonspecific but probably reflects mild chronic microvascular ischemic changes. No extra-axial collection. Vascular: Better evaluated on CTA portion. Skull: See below. Sinuses: Left sphenoid opacification. Orbits: Unremarkable. Review of the MIP images confirms the above findings CT NECK FINDINGS Streak and motion artifact. Pharynx and larynx: Known nasopharyngeal tumor with parapharyngeal and prevertebral involvement. Retained secretions in the pharynx. Salivary glands: Parotid and submandibular glands are unremarkable. Thyroid: Unremarkable. Lymph nodes: Decreased adenopathy since May 2023 PET CT. Vascular: Compression of the proximal internal jugular veins bilaterally below the skull base.  Calcified plaque at the common carotid bifurcations. Further arterial findings detailed below. Visualized orbits: Unremarkable. Visualized mastoids and paranasal sinuses: Bilateral mastoid and middle ear opacification. Skeleton: Erosion of the clivus extending into occipital condyles. Cervical spine degenerative changes. Upper chest: Mild emphysema.  Possible 4 mm right upper lobe nodule. Other: Partially imaged endotracheal and enteric tubes. Partially imaged right chest wall port catheter. CTA HEAD FINDINGS Anterior circulation: There is narrowing of the cervical right internal carotid as it passes through the mass with  at least 70% stenosis. Just below the skull base, there is a laterally directed wide neck outpouching measuring about 4 x 5 x 5 mm (series 8, image 14). Intracranially, there is persistent narrowing to the cavernous segment. Calcified plaque is present along the cavernous and supraclinoid portions with mild stenosis. Similarly, there is marked narrowing of the left cervical ICA as it passes through the abnormal soft tissue with focal fusiform dilatation just below the skull base measuring 6 mm (series 8, image 10). Intracranially, caliber is small to the cavernous segment. There is calcified plaque along the cavernous and supraclinoid portions with mild stenosis. Anterior and middle cerebral arteries are patent. Posterior circulation: Intracranial vertebral arteries are patent. Basilar artery is patent. Posterior cerebral arteries are patent. Bilateral posterior communicating arteries are present. Venous sinuses: As permitted by contrast timing, patent. 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 Decreased adenopathy since May 2023 PET CT. Marked (at least 70%) narrowing of cervical internal carotids passing through the above  abnormal soft tissue. 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 Focal 6 mm fusiform dilatation on the left just below the skull base. Small caliber of the right greater than left internal carotids intracranially up to the cavernous segments. Possible new 4 mm right upper lobe nodule. Nonemergent dedicated chest CT recommended. Electronically Signed   By: Macy Mis M.D.   On: 10/20/2021 12:54   DG Chest Port 1 View  Result Date: 10/13/2021 CLINICAL DATA:  Intubated. EXAM: PORTABLE CHEST 1 VIEW COMPARISON:  10/05/2021 FINDINGS: The endotracheal tube is 3.5 cm above the carina. The NG tube is coursing down the esophagus and into the stomach. Right IJ power port in good position, unchanged. The lungs are clear. No infiltrates, edema or effusions. No pulmonary lesions or pneumothorax. IMPRESSION: 1. Support apparatus in good position without complicating features. 2. No acute cardiopulmonary findings. Electronically Signed   By: Marijo Sanes M.D.   On: 10/12/2021 08:40   DG Abd 1 View  Result Date: 10/08/2021 CLINICAL DATA:  Status post endotracheal tube and OG tube placement. EXAM: ABDOMEN - 1 VIEW COMPARISON:  None Available. FINDINGS: Orogastric tube has been placed, tip overlying the level of the stomach. Bowel gas pattern is nonobstructive. Lung bases are clear. Moderate convex RIGHT scoliosis of the mid lumbar spine. IMPRESSION: Orogastric tube placement to the level of the stomach. Electronically Signed   By: Nolon Nations M.D.   On: 10/05/2021 08:34   _0 @ CT ANGIO HEAD W OR WO CONTRAST  Result Date: 10/11/2021 CLINICAL DATA:  Oropharyngeal bleeding with nasopharyngeal tumor; Head/neck cancer, monitor Head/neck cancer, oropharyngeal bleeding EXAM: CT ANGIOGRAPHY HEAD CT NECK WITH CONTRAST TECHNIQUE: Multidetector CT imaging of the head was performed using the standard protocol during bolus administration of intravenous contrast. Multiplanar CT image reconstructions and  MIPs were obtained to evaluate the vascular anatomy. Carotid stenosis measurements (when applicable) are obtained utilizing NASCET criteria, using the distal internal carotid diameter as the denominator. RADIATION DOSE REDUCTION: This exam was performed according to the departmental dose-optimization program which includes automated exposure control, adjustment of the mA and/or kV according to patient size and/or use of iterative reconstruction technique. CONTRAST:  36m OMNIPAQUE IOHEXOL 350 MG/ML SOLN COMPARISON:  None Available. FINDINGS: CT HEAD FINDINGS Brain: There is no acute intracranial hemorrhage, mass effect, or edema. Gray-white differentiation is preserved. Ventricles and sulci  are stable in size and configuration. Patchy hypoattenuation in the supratentorial white matter is nonspecific but probably reflects mild chronic microvascular ischemic changes. No extra-axial collection. Vascular: Better evaluated on CTA portion. Skull: See below. Sinuses: Left sphenoid opacification. Orbits: Unremarkable. Review of the MIP images confirms the above findings CT NECK FINDINGS Streak and motion artifact. Pharynx and larynx: Known nasopharyngeal tumor with parapharyngeal and prevertebral involvement. Retained secretions in the pharynx. Salivary glands: Parotid and submandibular glands are unremarkable. Thyroid: Unremarkable. Lymph nodes: Decreased adenopathy since May 2023 PET CT. Vascular: Compression of the proximal internal jugular veins bilaterally below the skull base. Calcified plaque at the common carotid bifurcations. Further arterial findings detailed below. Visualized orbits: Unremarkable. Visualized mastoids and paranasal sinuses: Bilateral mastoid and middle ear opacification. Skeleton: Erosion of the clivus extending into occipital condyles. Cervical spine degenerative changes. Upper chest: Mild emphysema.  Possible 4 mm right upper lobe nodule. Other: Partially imaged endotracheal and enteric tubes.  Partially imaged right chest wall port catheter. CTA HEAD FINDINGS Anterior circulation: There is narrowing of the cervical right internal carotid as it passes through the mass with at least 70% stenosis. Just below the skull base, there is a laterally directed wide neck outpouching measuring about 4 x 5 x 5 mm (series 8, image 14). Intracranially, there is persistent narrowing to the cavernous segment. Calcified plaque is present along the cavernous and supraclinoid portions with mild stenosis. Similarly, there is marked narrowing of the left cervical ICA as it passes through the abnormal soft tissue with focal fusiform dilatation just below the skull base measuring 6 mm (series 8, image 10). Intracranially, caliber is small to the cavernous segment. There is calcified plaque along the cavernous and supraclinoid portions with mild stenosis. Anterior and middle cerebral arteries are patent. Posterior circulation: Intracranial vertebral arteries are patent. Basilar artery is patent. Posterior cerebral arteries are patent. Bilateral posterior communicating arteries are present. Venous sinuses: As permitted by contrast timing, patent. 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 Decreased adenopathy since May 2023 PET CT. Marked (at least 70%) narrowing of cervical internal carotids passing through the above abnormal soft tissue. 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 Focal 6 mm fusiform dilatation on the left just below the skull base. Small caliber of the right greater than left internal carotids intracranially up to the cavernous segments. Possible new 4 mm right upper lobe nodule. Nonemergent dedicated chest CT recommended. Electronically Signed   By: Macy Mis M.D.   On: 10/05/2021 12:54   CT SOFT TISSUE NECK W  CONTRAST  Result Date: 10/17/2021 CLINICAL DATA:  Oropharyngeal bleeding with nasopharyngeal tumor; Head/neck cancer, monitor Head/neck cancer, oropharyngeal bleeding EXAM: CT ANGIOGRAPHY HEAD CT NECK WITH CONTRAST TECHNIQUE: Multidetector CT imaging of the head was performed using the standard protocol during bolus administration of intravenous contrast. Multiplanar CT image reconstructions and MIPs were obtained to evaluate the vascular anatomy. Carotid stenosis measurements (when applicable) are obtained utilizing NASCET criteria, using the distal internal carotid diameter as the denominator. RADIATION DOSE REDUCTION: This exam was performed according to the departmental dose-optimization program which includes automated exposure control, adjustment of the mA and/or kV according to patient size and/or use of iterative reconstruction technique. CONTRAST:  33mL OMNIPAQUE IOHEXOL 350 MG/ML SOLN COMPARISON:  None Available. FINDINGS: CT HEAD FINDINGS Brain: There is no acute intracranial hemorrhage, mass effect, or  edema. Gray-white differentiation is preserved. Ventricles and sulci are stable in size and configuration. Patchy hypoattenuation in the supratentorial white matter is nonspecific but probably reflects mild chronic microvascular ischemic changes. No extra-axial collection. Vascular: Better evaluated on CTA portion. Skull: See below. Sinuses: Left sphenoid opacification. Orbits: Unremarkable. Review of the MIP images confirms the above findings CT NECK FINDINGS Streak and motion artifact. Pharynx and larynx: Known nasopharyngeal tumor with parapharyngeal and prevertebral involvement. Retained secretions in the pharynx. Salivary glands: Parotid and submandibular glands are unremarkable. Thyroid: Unremarkable. Lymph nodes: Decreased adenopathy since May 2023 PET CT. Vascular: Compression of the proximal internal jugular veins bilaterally below the skull base. Calcified plaque at the common carotid  bifurcations. Further arterial findings detailed below. Visualized orbits: Unremarkable. Visualized mastoids and paranasal sinuses: Bilateral mastoid and middle ear opacification. Skeleton: Erosion of the clivus extending into occipital condyles. Cervical spine degenerative changes. Upper chest: Mild emphysema.  Possible 4 mm right upper lobe nodule. Other: Partially imaged endotracheal and enteric tubes. Partially imaged right chest wall port catheter. CTA HEAD FINDINGS Anterior circulation: There is narrowing of the cervical right internal carotid as it passes through the mass with at least 70% stenosis. Just below the skull base, there is a laterally directed wide neck outpouching measuring about 4 x 5 x 5 mm (series 8, image 14). Intracranially, there is persistent narrowing to the cavernous segment. Calcified plaque is present along the cavernous and supraclinoid portions with mild stenosis. Similarly, there is marked narrowing of the left cervical ICA as it passes through the abnormal soft tissue with focal fusiform dilatation just below the skull base measuring 6 mm (series 8, image 10). Intracranially, caliber is small to the cavernous segment. There is calcified plaque along the cavernous and supraclinoid portions with mild stenosis. Anterior and middle cerebral arteries are patent. Posterior circulation: Intracranial vertebral arteries are patent. Basilar artery is patent. Posterior cerebral arteries are patent. Bilateral posterior communicating arteries are present. Venous sinuses: As permitted by contrast timing, patent. 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 Decreased adenopathy since May 2023 PET CT. Marked (at least 70%) narrowing of cervical internal carotids passing through the above abnormal soft tissue. 5 mm pseudoaneurysm on  the right just below the skull base. Focal 6 mm fusiform dilatation on the left just below the skull base. Small caliber of the right greater than left internal carotids intracranially up to the cavernous segments. Possible new 4 mm right upper lobe nodule. Nonemergent dedicated chest CT recommended. Electronically Signed   By: Macy Mis M.D.   On: 11/01/2021 12:54     ASSESSMENT AND PLAN    -Multidisciplinary rounds held today  Acute Hypoxic Respiratory Failure -due to central airway occlusion from massive hemoptysis and occlusion of airway with large sized clott -s/p endotracheal intubation on MV with PRVC - ventilator management  -Wean Fio2 and PEEP as tolerated -will perform SAT/SBT when respiratory parameters are met -s/p bronchoscopy  -nebulized tXA 536m PNR q8h   Massive hemoptysis   - Unclear weather this is from caudally located nasopharyngeal carcinomatous lesion or trully from lung parenchyma below.   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  Going to pass this debris into the  posterior pharynx hypopharynx and larynx showed the tongue base to be normal, the epiglottis was normal, she had good vocal cord mobility.  There was no evidence of airway obstruction, the immediate subglottis appeared normal. 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  Tolerating poorly [oral mucositis; severe hyponatremia; dehydration; hearing loss-see below    Atrial fibrillation with rapid ventricular response    Cardiology following - currently improved with amiodarone not on anticoagulation    Severe protein calorie malnutrition -Patient is currently at high risk for re-feeding syndrome with electrolyte derrangements  Severe electrolyte derrangements    - nephrology and pharmacy consultation - appreciate input   - SIADH    -on chemotherapy    - on multiple medications that alter electrolytes     - on LR with KCL and 3NS     - s/p 1L NS bolus       Hearing losss    - 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 vs ototoxicity from cisplatin  NEUROLOGY - intubated and sedated - minimal sedation to achieve a RASS goal: -1 Wake up assessment pending    ID -continue IV abx as prescibed -follow up cultures  GI/Nutrition GI PROPHYLAXIS as indicated DIET-->TF's as tolerated Constipation protocol as indicated  ENDO - ICU hypoglycemic\Hyperglycemia protocol -check FSBS per protocol   ELECTROLYTES -follow labs as needed -replace as needed -pharmacy consultation   DVT/GI PRX ordered -SCDs  TRANSFUSIONS AS NEEDED MONITOR FSBS ASSESS the need for LABS as needed    Critical care provider statement:   Total critical care time: 108 minutes   Performed by: Lanney Gins MD   Critical care time was exclusive of separately billable procedures and treating other patients.   Critical care was necessary to treat or prevent imminent or  life-threatening deterioration.   Critical care was time spent personally by me on the following activities: development of treatment plan with patient and/or surrogate as well as nursing, discussions with consultants, evaluation of patient's response to treatment, examination of patient, obtaining history from patient or surrogate, ordering and performing treatments and interventions, ordering and review of laboratory studies, ordering and review of radiographic studies, pulse oximetry and re-evaluation of patient's condition.    Ottie Glazier, M.D.  Pulmonary & Critical Care Medicine

## 2021-10-17 NOTE — Progress Notes (Signed)
Contacted [facility] @ [phone] 10/17/2021 to make them aware of this pt's imminent passing. I spoke with Bethena Roys and was given a referral number of [phone]. Please call with a TOD when available.

## 2021-10-18 ENCOUNTER — Encounter: Payer: Self-pay | Admitting: Internal Medicine

## 2021-10-18 DIAGNOSIS — R042 Hemoptysis: Secondary | ICD-10-CM

## 2021-10-18 DIAGNOSIS — J9601 Acute respiratory failure with hypoxia: Secondary | ICD-10-CM

## 2021-10-18 DIAGNOSIS — Z978 Presence of other specified devices: Secondary | ICD-10-CM

## 2021-10-19 LAB — BPAM RBC
Blood Product Expiration Date: 202307182359
Blood Product Expiration Date: 202307222359
Blood Product Expiration Date: 202308012359
ISSUE DATE / TIME: 202307140811
Unit Type and Rh: 600
Unit Type and Rh: 6200
Unit Type and Rh: 6200

## 2021-10-19 LAB — TYPE AND SCREEN
ABO/RH(D): A POS
Antibody Screen: NEGATIVE
Unit division: 0
Unit division: 0
Unit division: 0

## 2021-10-20 ENCOUNTER — Ambulatory Visit: Payer: Medicare HMO

## 2021-10-21 ENCOUNTER — Ambulatory Visit: Payer: Medicare HMO

## 2021-10-21 LAB — PREPARE RBC (CROSSMATCH)

## 2021-10-22 ENCOUNTER — Ambulatory Visit: Payer: Medicare HMO

## 2021-10-23 ENCOUNTER — Ambulatory Visit: Payer: Medicare HMO

## 2021-10-24 ENCOUNTER — Ambulatory Visit: Payer: Medicare HMO

## 2021-10-27 ENCOUNTER — Ambulatory Visit: Payer: Medicare HMO

## 2021-10-28 ENCOUNTER — Ambulatory Visit: Payer: Medicare HMO

## 2021-10-29 ENCOUNTER — Ambulatory Visit: Payer: Medicare HMO

## 2021-11-04 NOTE — Progress Notes (Signed)
   Oct 20, 2021 0220  Clinical Encounter Type  Visited With Patient and family together;Health care provider  Visit Type Death  Spiritual Encounters  Spiritual Needs Grief support;Prayer   Chaplain Georgeanna Radziewicz offered compassionate presence and grief support to family at time of pt's passing. Chaplain B gathered the family at the bedside to offer some scripture and a prayer.  Chaplain B encouraged reflection, inviting family's memories. This was particularly painful for Zenia Resides, the younger brother of Carla Drape, who shared that they had lost another of their siblings just a month ago. Chaplain B offered supportive presence and listening to facilitate processing of emotion and memories.  Chaplain B stayed with the family and assisted their return to room after pt's body had tubing, etc removed. Family chose to remain by the bedside and this writer encouraged them to remain present as long as they wished.  Chaplain B also spend time talking with staff. Holly, Therapist, sports and the charge nurse both offered insight into [employer] to understand if they were to be involved; became more aware of current standard practices.

## 2021-11-04 NOTE — Progress Notes (Signed)
Spoke with Sharol Given with [facility] to make aware of cardiac TOD. Once family leaves, the eyes need to be prepared.

## 2021-11-04 NOTE — Progress Notes (Signed)
Per family request, ETT and OGT removed. Oral care performed.

## 2021-11-04 NOTE — Death Summary Note (Cosign Needed Addendum)
DEATH SUMMARY   Patient Details  Name: Vickie Caldwell MRN: [id number] DOB: 1950/08/28  Admission/Discharge Information   Admit Date:  2021-10-31  Date of Death: Date of Death: Nov 09, 2021  Time of Death: Time of Death: 0215  Length of Stay: 07/05/22  Referring Physician: Theotis Burrow, MD   Reason(s) for Hospitalization  Patient sent from the cancer center with abnormal lab work: severe hypokalemia and hyponatremia.  Diagnoses  Preliminary cause of death: nasopharyngeal carcinoma Secondary Diagnoses (including complications and co-morbidities):  Principal Problem:   Hyponatremia Active Problems:   Nasopharyngeal carcinoma (HCC)   Benign essential hypertension   Current smoker   Hypokalemia   Hypomagnesemia   Palliative care encounter   Atrial fibrillation with RVR (HCC)   Protein-calorie malnutrition, severe   Acute respiratory failure with hypoxia (HCC)   Massive hemoptysis   Endotracheally intubated   Brief Hospital Course (including significant findings, care, treatment, and services provided and events leading to death)  Vickie Caldwell is a 71 y.o. 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 B medical oncology, has chronic electrolyte disturbances with hyponatremia and hypokalemia, background history of COPD with centrilobular emphysema chronic hypoxemia, GERD came in initially on 10/31/2021 from oncology due to findings of altered mental status with visual disturbances, hearing and taste disturbances and dysphagia.  Also reported nausea vomiting and diarrhea times few days.  Blood work notable for worsening hyponatremia and hypokalemia which prompted admission from the ED.  Her initial sodium levels were 117 however she was communicative and was able to tolerate p.o. at that time.  Nephrology was consulted and started 3 NS without much clinical improvement after initial 24-hour. Palliative care  consultation was placed and PEG tube was ordered.  Her sodium levels slowly trended up, she subsequently developed atrial fibrillation and was seen by cardiology who started amiodarone. She was not placed on anticoagulation.  Due to her nasopharyngeal carcinoma general surgery was consulted with ENT help to perform G-tube placement, this was done due to disrupted airway from underlying cancer.   10/14/2021 Patient was in stepdown unit when she developed sudden massive hemoptysis with notable immediate hypoxemia and suffocation.  She was emergently intubated however findings were that the airway was 100% obstructed with large thrombus.  This was evacuated with the anterior Yankuer suctioning and large sized clot was retrieved from central airway measuring at 12 cm long and 1.5 cm wide with branching consistent with tracheal obstruction.  Post successful intubation and ventilation additional suctioning was performed however patient continued to have hemoptysis.  Family was contacted and emergency bronchoscopy was performed with topical TXA and ice cold lavage of airways.  Hemoptysis had subsided post treatment and patient stabilized on ventilator.  I met with family and had discussion regarding goals of care grandson explains that she did not wish to be on artificial life support and wanted to change CODE STATUS to DNR/DNI.  Additional family meeting is scheduled with attending physician Dr. Sheppard Coil as well as medical oncologist Dr. Rogue Bussing and Dr. Pryor Ochoa with ENT. Dr. 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 Per Dr. Pryor Ochoa the patient will likely have either recurrent oropharyngeal bleeding or she will have an acute CVA due to compression resulting in [place]. Hospice/comfort measures were recommended by PCCM, ENT and oncology. Transitioned to  comfort  measures on 10/17/21. The patient passed with family bedside on October 28, 2021.   Pertinent Labs and Studies  Significant Diagnostic Studies CT ANGIO HEAD W OR WO CONTRAST  Result Date: 10/07/2021 CLINICAL DATA:  Oropharyngeal bleeding with nasopharyngeal tumor; Head/neck cancer, monitor Head/neck cancer, oropharyngeal bleeding EXAM: CT ANGIOGRAPHY HEAD CT NECK WITH CONTRAST TECHNIQUE: Multidetector CT imaging of the head was performed using the standard protocol during bolus administration of intravenous contrast. Multiplanar CT image reconstructions and MIPs were obtained to evaluate the vascular anatomy. Carotid stenosis measurements (when applicable) are obtained utilizing NASCET criteria, using the distal internal carotid diameter as the denominator. RADIATION DOSE REDUCTION: This exam was performed according to the departmental dose-optimization program which includes automated exposure control, adjustment of the mA and/or kV according to patient size and/or use of iterative reconstruction technique. CONTRAST:  3m OMNIPAQUE IOHEXOL 350 MG/ML SOLN COMPARISON:  None Available. FINDINGS: CT HEAD FINDINGS Brain: There is no acute intracranial hemorrhage, mass effect, or edema. Gray-white differentiation is preserved. Ventricles and sulci are stable in size and configuration. Patchy hypoattenuation in the supratentorial white matter is nonspecific but probably reflects mild chronic microvascular ischemic changes. No extra-axial collection. Vascular: Better evaluated on CTA portion. Skull: See below. Sinuses: Left sphenoid opacification. Orbits: Unremarkable. Review of the MIP images confirms the above findings CT NECK FINDINGS Streak and motion artifact. Pharynx and larynx: Known nasopharyngeal tumor with parapharyngeal and prevertebral involvement. Retained secretions in the pharynx. Salivary glands: Parotid and submandibular glands are unremarkable. Thyroid: Unremarkable. Lymph nodes: Decreased  adenopathy since May 2023 PET CT. Vascular: Compression of the proximal internal jugular veins bilaterally below the skull base. Calcified plaque at the common carotid bifurcations. Further arterial findings detailed below. Visualized orbits: Unremarkable. Visualized mastoids and paranasal sinuses: Bilateral mastoid and middle ear opacification. Skeleton: Erosion of the clivus extending into occipital condyles. Cervical spine degenerative changes. Upper chest: Mild emphysema.  Possible 4 mm right upper lobe nodule. Other: Partially imaged endotracheal and enteric tubes. Partially imaged right chest wall port catheter. CTA HEAD FINDINGS Anterior circulation: There is narrowing of the cervical right internal carotid as it passes through the mass with at least 70% stenosis. Just below the skull base, there is a laterally directed wide neck outpouching measuring about 4 x 5 x 5 mm (series 8, image 14). Intracranially, there is persistent narrowing to the cavernous segment. Calcified plaque is present along the cavernous and supraclinoid portions with mild stenosis. Similarly, there is marked narrowing of the left cervical ICA as it passes through the abnormal soft tissue with focal fusiform dilatation just below the skull base measuring 6 mm (series 8, image 10). Intracranially, caliber is small to the cavernous segment. There is calcified plaque along the cavernous and supraclinoid portions with mild stenosis. Anterior and middle cerebral arteries are patent. Posterior circulation: Intracranial vertebral arteries are patent. Basilar artery is patent. Posterior cerebral arteries are patent. Bilateral posterior communicating arteries are present. Venous sinuses: As permitted by contrast timing, patent. 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  Decreased adenopathy since May 2023 PET CT. Marked (at least 70%) narrowing of cervical internal carotids passing through the above abnormal soft tissue. 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 Focal 6 mm fusiform dilatation on the left just below the skull base. Small caliber of the right greater than left internal carotids intracranially up to the cavernous  segments. Possible new 4 mm right upper lobe nodule. Nonemergent dedicated chest CT recommended. Electronically Signed   By: Macy Mis M.D.   On: 10/14/2021 12:54   CT SOFT TISSUE NECK W CONTRAST  Result Date: 10/21/2021 CLINICAL DATA:  Oropharyngeal bleeding with nasopharyngeal tumor; Head/neck cancer, monitor Head/neck cancer, oropharyngeal bleeding EXAM: CT ANGIOGRAPHY HEAD CT NECK WITH CONTRAST TECHNIQUE: Multidetector CT imaging of the head was performed using the standard protocol during bolus administration of intravenous contrast. Multiplanar CT image reconstructions and MIPs were obtained to evaluate the vascular anatomy. Carotid stenosis measurements (when applicable) are obtained utilizing NASCET criteria, using the distal internal carotid diameter as the denominator. RADIATION DOSE REDUCTION: This exam was performed according to the departmental dose-optimization program which includes automated exposure control, adjustment of the mA and/or kV according to patient size and/or use of iterative reconstruction technique. CONTRAST:  74mL OMNIPAQUE IOHEXOL 350 MG/ML SOLN COMPARISON:  None Available. FINDINGS: CT HEAD FINDINGS Brain: There is no acute intracranial hemorrhage, mass effect, or edema. Gray-white differentiation is preserved. Ventricles and sulci are stable in size and configuration. Patchy hypoattenuation in the supratentorial white matter is nonspecific but probably reflects mild chronic microvascular ischemic changes. No extra-axial collection. Vascular: Better evaluated on CTA portion. Skull: See below.  Sinuses: Left sphenoid opacification. Orbits: Unremarkable. Review of the MIP images confirms the above findings CT NECK FINDINGS Streak and motion artifact. Pharynx and larynx: Known nasopharyngeal tumor with parapharyngeal and prevertebral involvement. Retained secretions in the pharynx. Salivary glands: Parotid and submandibular glands are unremarkable. Thyroid: Unremarkable. Lymph nodes: Decreased adenopathy since May 2023 PET CT. Vascular: Compression of the proximal internal jugular veins bilaterally below the skull base. Calcified plaque at the common carotid bifurcations. Further arterial findings detailed below. Visualized orbits: Unremarkable. Visualized mastoids and paranasal sinuses: Bilateral mastoid and middle ear opacification. Skeleton: Erosion of the clivus extending into occipital condyles. Cervical spine degenerative changes. Upper chest: Mild emphysema.  Possible 4 mm right upper lobe nodule. Other: Partially imaged endotracheal and enteric tubes. Partially imaged right chest wall port catheter. CTA HEAD FINDINGS Anterior circulation: There is narrowing of the cervical right internal carotid as it passes through the mass with at least 70% stenosis. Just below the skull base, there is a laterally directed wide neck outpouching measuring about 4 x 5 x 5 mm (series 8, image 14). Intracranially, there is persistent narrowing to the cavernous segment. Calcified plaque is present along the cavernous and supraclinoid portions with mild stenosis. Similarly, there is marked narrowing of the left cervical ICA as it passes through the abnormal soft tissue with focal fusiform dilatation just below the skull base measuring 6 mm (series 8, image 10). Intracranially, caliber is small to the cavernous segment. There is calcified plaque along the cavernous and supraclinoid portions with mild stenosis. Anterior and middle cerebral arteries are patent. Posterior circulation: Intracranial vertebral arteries are  patent. Basilar artery is patent. Posterior cerebral arteries are patent. Bilateral posterior communicating arteries are present. Venous sinuses: As permitted by contrast timing, patent. 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 Decreased adenopathy since May 2023 PET CT. Marked (at least 70%) narrowing of cervical internal carotids passing through the above abnormal soft tissue. 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 Focal 6 mm fusiform dilatation on the left just below the skull base. Small caliber of the right greater than  left internal carotids intracranially up to the cavernous segments. Possible new 4 mm right upper lobe nodule. Nonemergent dedicated chest CT recommended. Electronically Signed   By: Macy Mis M.D.   On: 10/23/2021 12:54   DG Chest Port 1 View  Result Date: 10/06/2021 CLINICAL DATA:  Intubated. EXAM: PORTABLE CHEST 1 VIEW COMPARISON:  10/21/2021 FINDINGS: The endotracheal tube is 3.5 cm above the carina. The NG tube is coursing down the esophagus and into the stomach. Right IJ power port in good position, unchanged. The lungs are clear. No infiltrates, edema or effusions. No pulmonary lesions or pneumothorax. IMPRESSION: 1. Support apparatus in good position without complicating features. 2. No acute cardiopulmonary findings. Electronically Signed   By: Marijo Sanes M.D.   On: 10/08/2021 08:40   DG Abd 1 View  Result Date: 10/12/2021 CLINICAL DATA:  Status post endotracheal tube and OG tube placement. EXAM: ABDOMEN - 1 VIEW COMPARISON:  None Available. FINDINGS: Orogastric tube has been placed, tip overlying the level of the stomach. Bowel gas pattern is nonobstructive. Lung bases are clear. Moderate convex RIGHT scoliosis of the mid lumbar spine. IMPRESSION: Orogastric tube placement to the  level of the stomach. Electronically Signed   By: Nolon Nations M.D.   On: 10/20/2021 08:34   ECHOCARDIOGRAM COMPLETE  Result Date: 10/14/2021    ECHOCARDIOGRAM REPORT   Patient Name:   Vickie Caldwell Date of Exam: 10/14/2021 Medical Rec #:  [id number]        Height:       56.0 in Accession #:    [id number]       Weight:       96.3 lb Date of Birth:  [birth year]        BSA:          1.301 m Patient Age:    41 years         BP:           141/70 mmHg Patient Gender: F                HR:           91 bpm. Exam Location:  [facility] Procedure: 2D Echo, Cardiac Doppler and Color Doppler Indications:     Atrial Fibrillation I48.91  History:         Patient has no prior history of Echocardiogram examinations.                  COPD and TIA; Risk Factors:Hypertension.  Sonographer:     Sherrie Sport Referring Phys:  [id number] [place] TANG Diagnosing Phys: Serafina Royals MD  Sonographer Comments: Image acquisition challenging due to patient body habitus. IMPRESSIONS  1. Left ventricular ejection fraction, by estimation, is 60 to 65%. The left ventricle has normal function. The left ventricle has no regional wall motion abnormalities. Left ventricular diastolic parameters were normal.  2. Right ventricular systolic function is normal. The right ventricular size is normal.  3. The mitral valve is normal in structure. Trivial mitral valve regurgitation.  4. The aortic valve is normal in structure. Aortic valve regurgitation is not visualized. FINDINGS  Left Ventricle: Left ventricular ejection fraction, by estimation, is 60 to 65%. The left ventricle has normal function. The left ventricle has no regional wall motion abnormalities. The left ventricular internal cavity size was normal in size. There is  no left ventricular hypertrophy. Left ventricular diastolic parameters were normal. Right Ventricle: The right ventricular size is normal. No increase in right  ventricular wall thickness. Right ventricular systolic function is  normal. Left Atrium: Left atrial size was normal in size. Right Atrium: Right atrial size was normal in size. Pericardium: There is no evidence of pericardial effusion. Mitral Valve: The mitral valve is normal in structure. Trivial mitral valve regurgitation. Tricuspid Valve: The tricuspid valve is normal in structure. Tricuspid valve regurgitation is trivial. Aortic Valve: The aortic valve is normal in structure. Aortic valve regurgitation is not visualized. Aortic valve mean gradient measures 2.0 mmHg. Aortic valve peak gradient measures 4.2 mmHg. Aortic valve area, by VTI measures 3.00 cm. Pulmonic Valve: The pulmonic valve was normal in structure. Pulmonic valve regurgitation is not visualized. Aorta: The aortic root and ascending aorta are structurally normal, with no evidence of dilitation. IAS/Shunts: No atrial level shunt detected by color flow Doppler.  LEFT VENTRICLE PLAX 2D LVIDd:         3.20 cm   Diastology LVIDs:         1.90 cm   LV e' medial:    7.62 cm/s LV PW:         1.20 cm   LV E/e' medial:  7.7 LV IVS:        0.90 cm   LV e' lateral:   7.07 cm/s LVOT diam:     2.00 cm   LV E/e' lateral: 8.2 LV SV:         46 LV SV Index:   36 LVOT Area:     3.14 cm  RIGHT VENTRICLE RV Basal diam:  2.80 cm RV S prime:     18.90 cm/s TAPSE (M-mode): 3.0 cm LEFT ATRIUM             Index        RIGHT ATRIUM           Index LA diam:        2.40 cm 1.84 cm/m   RA Area:     13.20 cm LA Vol (A2C):   35.2 ml 27.05 ml/m  RA Volume:   28.80 ml  22.13 ml/m LA Vol (A4C):   28.7 ml 22.05 ml/m LA Biplane Vol: 34.8 ml 26.74 ml/m  AORTIC VALVE AV Area (Vmax):    2.47 cm AV Area (Vmean):   2.27 cm AV Area (VTI):     3.00 cm AV Vmax:           103.00 cm/s AV Vmean:          73.300 cm/s AV VTI:            0.155 m AV Peak Grad:      4.2 mmHg AV Mean Grad:      2.0 mmHg LVOT Vmax:         80.90 cm/s LVOT Vmean:        52.900 cm/s LVOT VTI:          0.148 m LVOT/AV VTI ratio: 0.95  AORTA Ao Root diam: 3.10 cm MITRAL VALVE                TRICUSPID VALVE MV Area (PHT): 4.33 cm    TR Peak grad:   15.1 mmHg MV Decel Time: 175 msec    TR Vmax:        194.00 cm/s MV E velocity: 58.30 cm/s MV A velocity: 72.00 cm/s  SHUNTS MV E/A ratio:  0.81        Systemic VTI:  0.15 m  Systemic Diam: 2.00 cm Serafina Royals MD Electronically signed by Serafina Royals MD Signature Date/Time: 10/14/2021/12:55:20 PM    Final    MR BRAIN W WO CONTRAST  Result Date: 10/11/2021 CLINICAL DATA:  Chronic headache EXAM: MRI HEAD WITHOUT AND WITH CONTRAST TECHNIQUE: Multiplanar, multiecho pulse sequences of the brain and surrounding structures were obtained without and with intravenous contrast. CONTRAST:  40m GADAVIST GADOBUTROL 1 MMOL/ML IV SOLN COMPARISON:  Head CT 08/07/2021 Head CT 06/10/2021 FINDINGS: Brain: No acute infarct, mass effect or extra-axial collection. No acute or chronic hemorrhage. There is multifocal hyperintense T2-weighted signal within the white matter. Generalized volume loss. The midline structures are normal. Vascular: Major flow voids are preserved. Skull and upper cervical spine: Normal calvarium and skull base. Visualized upper cervical spine and soft tissues are normal. Sinuses/Orbits:Bilateral mastoid effusions. Moderate mucosal thickening of the sphenoid sinuses. Normal orbits. There is abnormal contrast enhancement of the nasopharynx and prevertebral soft tissues. Diffusely abnormal bone marrow signal within the clivus, which is likely invaded by adjacent tumor. Abnormal contrast enhancement extends bilaterally into the parapharyngeal spaces. IMPRESSION: 1. No acute intracranial abnormality. 2. No nasopharyngeal carcinoma with large area of abnormal contrast enhancement extending into the prevertebral and parapharyngeal soft tissues as well as invading the clivus. 3. Bilateral mastoid effusions and moderate mucosal thickening of the sphenoid sinuses. Electronically Signed   By: KUlyses JarredM.D.   On:  10/11/2021 02:05   DG Chest Portable 1 View  Result Date: 10/08/2021 CLINICAL DATA:  Hyponatremia. History of metastatic head and neck cancer. EXAM: PORTABLE CHEST 1 VIEW COMPARISON:  PET-CT dated Aug 07, 2021. FINDINGS: New right chest wall port catheter with tip in the proximal right atrium. The heart size and mediastinal contours are within normal limits. Both lungs are clear. The visualized skeletal structures are unremarkable. IMPRESSION: No active disease. Electronically Signed   By: WTitus DubinM.D.   On: 10/04/2021 10:30    Microbiology Recent Results (from the past 240 hour(s))  MRSA Next Gen by PCR, Nasal     Status: None   Collection Time: 10/05/2021 12:43 PM   Specimen: Nasal Mucosa; Nasal Swab  Result Value Ref Range Status   MRSA by PCR Next Gen NOT DETECTED NOT DETECTED Final    Comment: (NOTE) The GeneXpert MRSA Assay (FDA approved for NASAL specimens only), is one component of a comprehensive MRSA colonization surveillance program. It is not intended to diagnose MRSA infection nor to guide or monitor treatment for MRSA infections. Test performance is not FDA approved in patients less than 237years old. Performed at [facility] [address], [place] [address]    Lab Basic Metabolic Panel: Recent Labs  Lab 10/13/21 1506 10/13/21 2248 10/14/21 0434 10/14/21 1344 10/15/21 0002 10/15/21 0526 10/15/21 1259 10/15/21 2200 10/17/2021 0500 10/14/2021 0833 10/29/2021 1254  NA 128* 126* 129*  128*   < >  --  128* 128* 129* 131* 131* 133*  K 2.9* 3.4* 2.5*   < > 3.0* 2.8* 3.4*  --  3.2* 3.5  --   CL 93* 90* 94*  --   --  89*  --   --  90* 94*  --   CO2 22 24 24   --   --  29  --   --  30 26  --   GLUCOSE 98 125* 120*  --   --  131*  --   --  135* 175*  --   BUN <5* <5* <  5*  --   --  <5*  --   --  <5* <5*  --   CREATININE 0.40* <0.30* <0.30*  --   --  <0.30*  --   --  <0.30* 0.37*  --   CALCIUM 8.3* 7.8* 8.1*  --   --  8.5*  --   --  8.3* 7.9*  --    MG 1.7  --  2.0  --   --  1.8  --   --  1.9  --   --   PHOS  --   --   --   --   --   --   --   --  2.6 3.4  --    < > = values in this interval not displayed.   Liver Function Tests: Recent Labs  Lab 10/29/2021 0500 10/10/2021 0833  ALBUMIN 2.1* 1.9*   No results for input(s): "LIPASE", "AMYLASE" in the last 168 hours. No results for input(s): "AMMONIA" in the last 168 hours. CBC: Recent Labs  Lab 10/11/21 0722 10/12/21 0427 10/13/21 2248 10/14/2021 0833  WBC 14.0* 13.0* 14.2* 16.2*  NEUTROABS  --   --   --  14.7*  HGB 9.8* 8.6* 8.2* 8.1*  HCT 30.2* 26.4* 25.8* 25.8*  MCV 84.1 83.0 83.8 84.9  PLT 520* 500* 375 361   Cardiac Enzymes: No results for input(s): "CKTOTAL", "CKMB", "CKMBINDEX", "TROPONINI" in the last 168 hours. Sepsis Labs: Recent Labs  Lab 10/11/21 0722 10/12/21 0427 10/13/21 2248 10/08/2021 0833  WBC 14.0* 13.0* 14.2* 16.2*    Procedures/Operations  ETT placement 10/20/2021 Diagnostic Bronchoscopy 10/14/2021   Huel Cote [place] Oct 21, 2021, 02:30 am  Domingo Pulse [place], AGACNP-BC Acute Care Nurse Practitioner [facility]   [phone] / [phone] Please see Amion for pager details.

## 2021-11-04 DEATH — deceased

## 2022-11-22 IMAGING — CT NM PET TUM IMG INITIAL (PI) SKULL BASE T - THIGH
6 series · 25 of 25 positions shown · non-contrast
Comparison: Chest CT 03/12/2020

CLINICAL DATA: Initial treatment strategy for poorly differentiated
nasopharyngeal carcinoma with squamous differentiation and
keratinization.

EXAM:
NUCLEAR MEDICINE PET SKULL BASE TO THIGH
TECHNIQUE: 6.0 mCi F-18 FDG was injected intravenously. Full-ring PET imaging
was performed from the skull base to thigh after the radiotracer. CT
data was obtained and used for attenuation correction and anatomic
localization.
Fasting blood glucose: 114 mg/dl

[Series 2: ct slices · axial · 3.8mm · 1.37mm/px · z∈[-857,+0]mm · 6 of 262 slices shown]
[im 1/262]
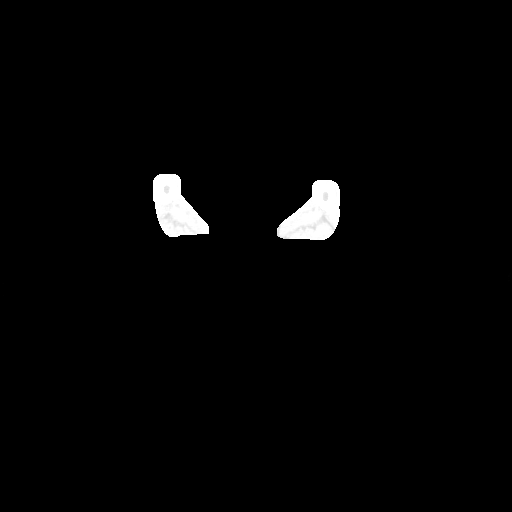
[im 53/262]
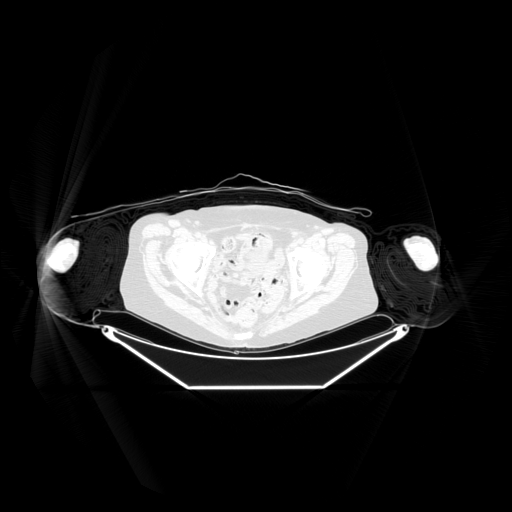
[im 105/262]
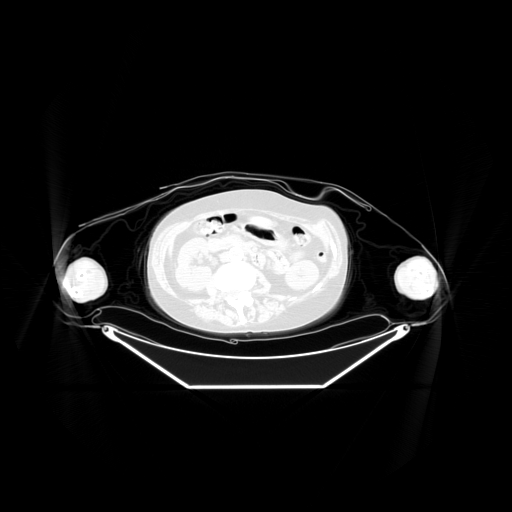
[im 157/262]
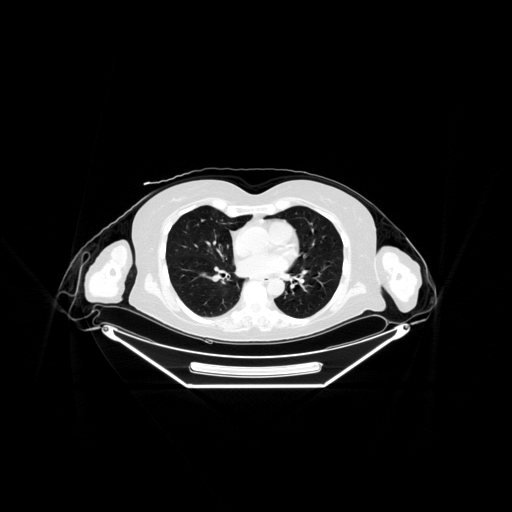
[im 209/262]
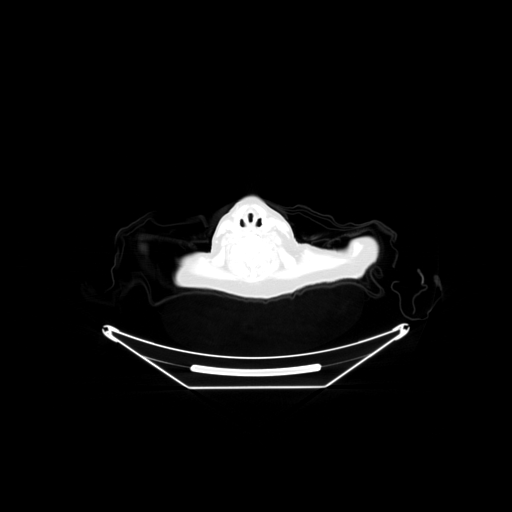
[im 262/262]
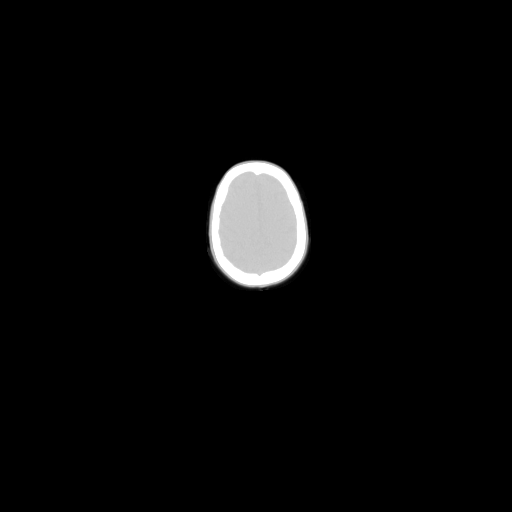

[Series 3: pet ac 3d body · axial · 3.3mm · 5.47mm/px · z∈[-857,+0]mm · 5 of 263 slices shown]
[im 1/263]
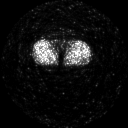
[im 66/263]
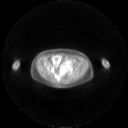
[im 132/263]
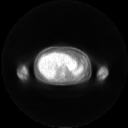
[im 197/263]
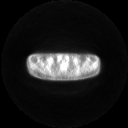
[im 263/263]
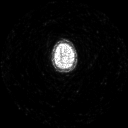

[Series 4: pet nac 3d body · axial · 3.3mm · 5.47mm/px · z∈[-857,+0]mm · 5 of 263 slices shown]
[im 1/263]
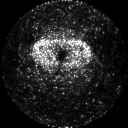
[im 66/263]
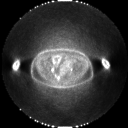
[im 132/263]
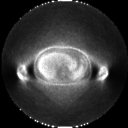
[im 197/263]
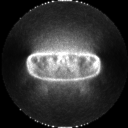
[im 263/263]
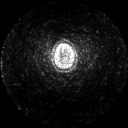

[Series 303: pet axial · axial · 3.3mm · 5.47mm/px · z∈[-857,+0]mm · 5 of 263 slices shown]
[im 1/263]
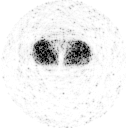
[im 66/263]
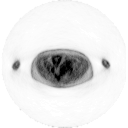
[im 132/263]
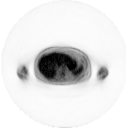
[im 197/263]
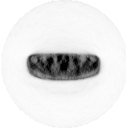
[im 263/263]
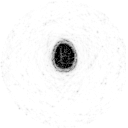

[Series 304: pet sagittal · sagittal · 5.5mm · 6.88mm/px · 2 of 101 slices shown]
[im 1/101]
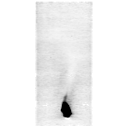
[im 101/101]
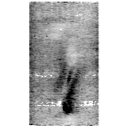

[Series 305: pet coronal · coronal · 5.5mm · 6.88mm/px · 2 of 89 slices shown]
[im 1/89]
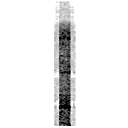
[im 89/89]
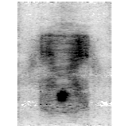

[25 of 25 positions shown; findings below may reference images not displayed]

FINDINGS: Mediastinal blood pool activity: SUV max

Liver activity: SUV max NA

NECK: Abnormal accentuated activity along the posterior nasopharynx
along the mucosal pharyngeal space, with right greater than left
extension potentially along the right prevertebral space, with
maximum SUV 12.7.

Several clustered right level IIb lymph nodes are hypermetabolic,
the largest of these is a 1.0 cm lymph node on image 42 of series 2
with maximum SUV

A left level IIb lymph node on image 42 of series 2 has a short axis
diameter of 0.4 cm and a maximum SUV of 2.1 which is near blood
pool.

A right level IV lymph node measuring 0.9 cm in short axis on image
60 of series 2 has a maximum SUV of 9.8.

Just above the thoracic inlet along the posterior left thyroid lobe,
a 1.4 by 0.9 cm hypodense lesion is present on image 63 of series 2,
maximum SUV 1.9 which is not greater than the rest of the thyroid
gland. This finding was also present on the prior chest CT of
09/23/2017 and is probably a small thyroid nodule rather than a
lymph node. Not clinically significant; no follow-up imaging
recommended (ref: [HOSPITAL]. [DATE]): 143-50).

Incidental CT findings: Bilateral common carotid atherosclerotic
calcification.

CHEST: No significant abnormal hypermetabolic activity in this
region.

Incidental CT findings: Coronary, aortic arch, and branch vessel
atherosclerotic vascular disease. Centrilobular emphysema.

ABDOMEN/PELVIS: No significant abnormal hypermetabolic activity in
this region.

Incidental CT findings: Atherosclerosis is present, including
aortoiliac atherosclerotic disease. Sigmoid colon diverticulosis.

SKELETON: No significant abnormal hypermetabolic activity in this
region.

Incidental CT findings: Thoracolumbar scoliosis. Old healed right
posterior rib fractures.
IMPRESSION: 1. Posterior nasopharyngeal mass maximum SUV 12.7, compatible with
malignancy.
2. Hypermetabolic right level IIb and right level IV lymph nodes
compatible with malignant spread.
3. A 4 mm in short axis left level IIb lymph node has a maximum SUV
of 2.1 which is about at the level of the blood pool, probably not
involved.
4. Other imaging findings of potential clinical significance: Aortic
Atherosclerosis (1BCT0-TQN.N). Coronary and systemic
atherosclerosis. Emphysema (1BCT0-IR4.G). Sigmoid colon
diverticulosis. Scoliosis.

## 2022-11-29 IMAGING — US IR IMAGING GUIDED PORT INSERTION
1 series · 1 of 1 positions shown · non-contrast
Comparison: none

INDICATION: Nasopharyngeal carcinoma

[Series 1: ir imaging guided port insertion · 0.07mm/px · 1 of 1 slices shown]
[im 1/1]
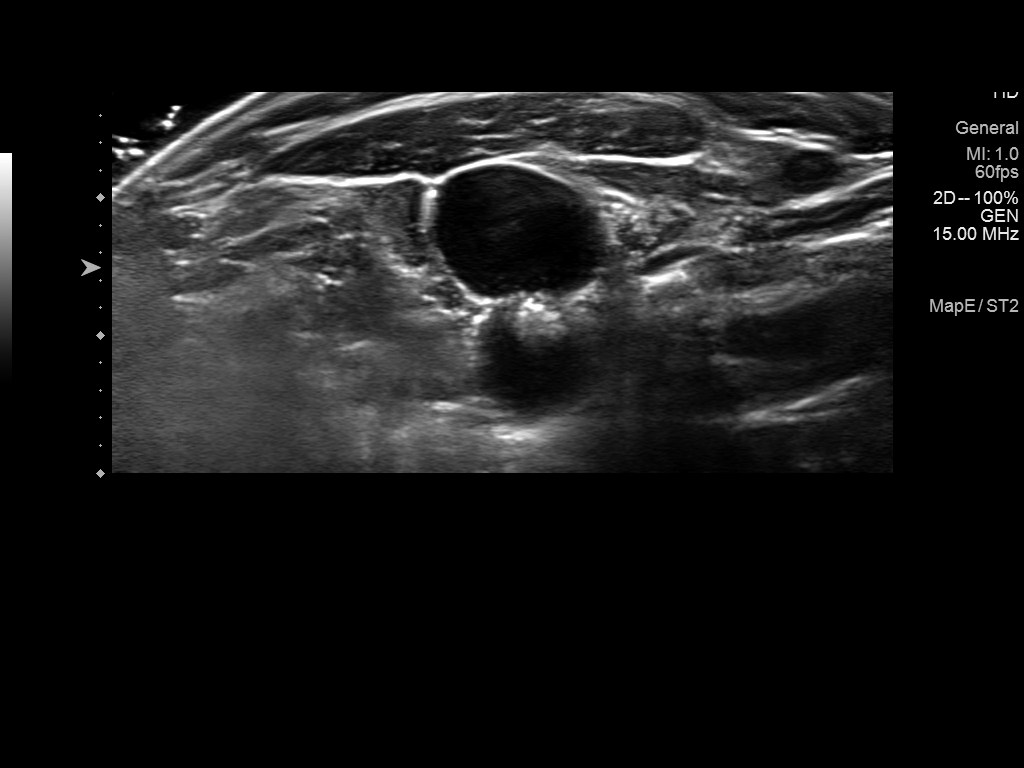

[1 of 1 positions shown; findings below may reference images not displayed]

EXAM:
IMPLANTED PORT A CATH PLACEMENT WITH ULTRASOUND AND FLUOROSCOPIC
GUIDANCE

MEDICATIONS:
None

ANESTHESIA/SEDATION:
Moderate (conscious) sedation was employed during this procedure. A
total of Versed 1.5 mg and Fentanyl 75 mcg was administered
intravenously by the radiology nurse.

Total intra-service moderate Sedation Time: 16 minutes. The
patient's level of consciousness and vital signs were monitored
continuously by radiology nursing throughout the procedure under my
direct supervision.

FLUOROSCOPY:
Radiation Exposure Index (as provided by the fluoroscopic device):
2.1 mGy Kerma

COMPLICATIONS:
None immediate.

PROCEDURE:
The procedure, risks, benefits, and alternatives were explained to
the patient. Questions regarding the procedure were encouraged and
answered. The patient understands and consents to the procedure.

A timeout was performed prior to the initiation of the procedure.

Patient positioned supine on the angiography table.

Right neck and anterior upper chest prepped and draped in the usual
sterile fashion. All elements of maximal sterile barrier were
utilized including, cap, mask, sterile gown, sterile gloves, large
sterile drape, hand scrubbing and 2% Chlorhexidine for skin
cleaning.

The right internal jugular vein was evaluated with ultrasound and
shown to be patent. A permanent ultrasound image was obtained and
placed in the patient's medical record. Local anesthesia was
provided with 1% lidocaine with epinephrine.

Using sterile gel and a sterile probe cover, the right internal
jugular vein was entered with a 21 ga needle during real time
ultrasound guidance.

0.018 inch guidewire placed and 21 ga needle exchanged for
transitional dilator set. Utilizing fluoroscopy, 0.035 inch
guidewire advanced through the needle without difficulty.

Attention then turned to the right anterior upper chest. Following
local lidocaine administration, a port pocket was created. The
catheter was connected to the port and brought from the pocket to
the venotomy site through a subcutaneous tunnel.

The catheter was cut to size and inserted through the peel-away
sheath. The catheter tip was positioned at the cavoatrial junction
using fluoroscopic guidance.

The port aspirated and flushed well. The port pocket was closed with
deep and superficial absorbable suture. The port pocket incision and
venotomy sites were also sealed with Dermabond.
IMPRESSION: Successful placement of a right internal jugular approach power
injectable Port-A-Cath. The catheter is ready for immediate use.
